# Patient Record
Sex: Female | Born: 1945 | Race: White | Hispanic: No | Marital: Married | Smoking: Never smoker
Health system: Southern US, Community
[De-identification: ages and names within clinical notes are randomized; demographics above are authoritative.]

## PROBLEM LIST (undated history)

## (undated) DIAGNOSIS — I10 Essential (primary) hypertension: Secondary | ICD-10-CM

## (undated) DIAGNOSIS — R42 Dizziness and giddiness: Secondary | ICD-10-CM

## (undated) DIAGNOSIS — J302 Other seasonal allergic rhinitis: Secondary | ICD-10-CM

## (undated) DIAGNOSIS — E669 Obesity, unspecified: Secondary | ICD-10-CM

## (undated) DIAGNOSIS — K228 Other specified diseases of esophagus: Secondary | ICD-10-CM

## (undated) DIAGNOSIS — R251 Tremor, unspecified: Secondary | ICD-10-CM

## (undated) DIAGNOSIS — K219 Gastro-esophageal reflux disease without esophagitis: Secondary | ICD-10-CM

## (undated) DIAGNOSIS — K440 Diaphragmatic hernia with obstruction, without gangrene: Secondary | ICD-10-CM

## (undated) DIAGNOSIS — K3189 Other diseases of stomach and duodenum: Secondary | ICD-10-CM

## (undated) DIAGNOSIS — M1990 Unspecified osteoarthritis, unspecified site: Secondary | ICD-10-CM

## (undated) DIAGNOSIS — E041 Nontoxic single thyroid nodule: Secondary | ICD-10-CM

## (undated) HISTORY — DX: Other seasonal allergic rhinitis: J30.2

## (undated) HISTORY — DX: Dizziness and giddiness: R42

## (undated) HISTORY — DX: Unspecified osteoarthritis, unspecified site: M19.90

## (undated) HISTORY — PX: CHOLECYSTECTOMY: SHX55

## (undated) HISTORY — DX: Diaphragmatic hernia with obstruction, without gangrene: K44.0

## (undated) HISTORY — PX: MOUTH SURGERY: SHX715

## (undated) HISTORY — DX: Nontoxic single thyroid nodule: E04.1

## (undated) HISTORY — DX: Essential (primary) hypertension: I10

## (undated) HISTORY — DX: Obesity, unspecified: E66.9

## (undated) HISTORY — PX: TUBAL LIGATION: SHX77

---

## 2005-12-27 ENCOUNTER — Encounter: Admission: RE | Admit: 2005-12-27 | Discharge: 2005-12-27 | Payer: Self-pay | Admitting: Unknown Physician Specialty

## 2006-07-12 ENCOUNTER — Encounter: Admission: RE | Admit: 2006-07-12 | Discharge: 2006-07-12 | Payer: Self-pay | Admitting: Unknown Physician Specialty

## 2006-12-07 ENCOUNTER — Encounter: Admission: RE | Admit: 2006-12-07 | Discharge: 2006-12-07 | Payer: Self-pay | Admitting: Family Medicine

## 2007-12-09 ENCOUNTER — Encounter: Admission: RE | Admit: 2007-12-09 | Discharge: 2007-12-09 | Payer: Self-pay | Admitting: Family Medicine

## 2008-12-30 ENCOUNTER — Encounter: Admission: RE | Admit: 2008-12-30 | Discharge: 2008-12-30 | Payer: Self-pay | Admitting: Family Medicine

## 2010-01-14 ENCOUNTER — Encounter: Admission: RE | Admit: 2010-01-14 | Discharge: 2010-01-14 | Payer: Self-pay | Admitting: Family Medicine

## 2010-11-30 ENCOUNTER — Ambulatory Visit: Payer: Self-pay | Admitting: Cardiology

## 2011-01-04 ENCOUNTER — Other Ambulatory Visit: Payer: Self-pay | Admitting: Family Medicine

## 2011-01-04 DIAGNOSIS — Z1231 Encounter for screening mammogram for malignant neoplasm of breast: Secondary | ICD-10-CM

## 2011-02-10 ENCOUNTER — Ambulatory Visit
Admission: RE | Admit: 2011-02-10 | Discharge: 2011-02-10 | Disposition: A | Discharge status: Home / Self Care | Payer: Medicare Other | Source: Ambulatory Visit | Attending: Family Medicine | Admitting: Family Medicine

## 2011-02-10 DIAGNOSIS — Z1231 Encounter for screening mammogram for malignant neoplasm of breast: Secondary | ICD-10-CM

## 2012-01-26 ENCOUNTER — Other Ambulatory Visit: Payer: Self-pay | Admitting: Family Medicine

## 2012-01-26 DIAGNOSIS — Z1231 Encounter for screening mammogram for malignant neoplasm of breast: Secondary | ICD-10-CM

## 2012-02-27 ENCOUNTER — Ambulatory Visit
Admission: RE | Admit: 2012-02-27 | Discharge: 2012-02-27 | Disposition: A | Discharge status: Home / Self Care | Payer: Medicare Other | Source: Ambulatory Visit | Attending: Family Medicine | Admitting: Family Medicine

## 2012-02-27 DIAGNOSIS — Z1231 Encounter for screening mammogram for malignant neoplasm of breast: Secondary | ICD-10-CM

## 2012-03-12 ENCOUNTER — Other Ambulatory Visit: Payer: Self-pay | Admitting: Family Medicine

## 2012-03-12 DIAGNOSIS — R928 Other abnormal and inconclusive findings on diagnostic imaging of breast: Secondary | ICD-10-CM

## 2012-03-19 ENCOUNTER — Ambulatory Visit
Admission: RE | Admit: 2012-03-19 | Discharge: 2012-03-19 | Disposition: A | Discharge status: Home / Self Care | Payer: Medicare Other | Source: Ambulatory Visit | Attending: Family Medicine | Admitting: Family Medicine

## 2012-03-19 DIAGNOSIS — R928 Other abnormal and inconclusive findings on diagnostic imaging of breast: Secondary | ICD-10-CM

## 2014-03-13 HISTORY — PX: CATARACT EXTRACTION, BILATERAL: SHX1313

## 2014-04-23 DIAGNOSIS — H2513 Age-related nuclear cataract, bilateral: Secondary | ICD-10-CM | POA: Diagnosis not present

## 2014-05-29 DIAGNOSIS — H524 Presbyopia: Secondary | ICD-10-CM | POA: Diagnosis not present

## 2014-05-29 DIAGNOSIS — H25012 Cortical age-related cataract, left eye: Secondary | ICD-10-CM | POA: Diagnosis not present

## 2014-05-29 DIAGNOSIS — H3531 Nonexudative age-related macular degeneration: Secondary | ICD-10-CM | POA: Diagnosis not present

## 2014-05-29 DIAGNOSIS — H2512 Age-related nuclear cataract, left eye: Secondary | ICD-10-CM | POA: Diagnosis not present

## 2014-06-09 DIAGNOSIS — H2512 Age-related nuclear cataract, left eye: Secondary | ICD-10-CM | POA: Diagnosis not present

## 2014-06-09 DIAGNOSIS — H2513 Age-related nuclear cataract, bilateral: Secondary | ICD-10-CM | POA: Diagnosis not present

## 2014-06-22 DIAGNOSIS — H25011 Cortical age-related cataract, right eye: Secondary | ICD-10-CM | POA: Diagnosis not present

## 2014-06-22 DIAGNOSIS — H2511 Age-related nuclear cataract, right eye: Secondary | ICD-10-CM | POA: Diagnosis not present

## 2014-06-30 DIAGNOSIS — H2511 Age-related nuclear cataract, right eye: Secondary | ICD-10-CM | POA: Diagnosis not present

## 2014-07-06 DIAGNOSIS — J209 Acute bronchitis, unspecified: Secondary | ICD-10-CM | POA: Diagnosis not present

## 2014-07-21 DIAGNOSIS — Z961 Presence of intraocular lens: Secondary | ICD-10-CM | POA: Diagnosis not present

## 2015-03-16 DIAGNOSIS — R609 Edema, unspecified: Secondary | ICD-10-CM | POA: Diagnosis not present

## 2015-03-16 DIAGNOSIS — J209 Acute bronchitis, unspecified: Secondary | ICD-10-CM | POA: Diagnosis not present

## 2015-03-16 DIAGNOSIS — M545 Low back pain: Secondary | ICD-10-CM | POA: Diagnosis not present

## 2015-03-18 DIAGNOSIS — Z1389 Encounter for screening for other disorder: Secondary | ICD-10-CM | POA: Diagnosis not present

## 2015-03-18 DIAGNOSIS — Z23 Encounter for immunization: Secondary | ICD-10-CM | POA: Diagnosis not present

## 2015-03-18 DIAGNOSIS — Z0000 Encounter for general adult medical examination without abnormal findings: Secondary | ICD-10-CM | POA: Diagnosis not present

## 2015-07-09 DIAGNOSIS — Z209 Contact with and (suspected) exposure to unspecified communicable disease: Secondary | ICD-10-CM | POA: Diagnosis not present

## 2015-08-19 DIAGNOSIS — Z209 Contact with and (suspected) exposure to unspecified communicable disease: Secondary | ICD-10-CM | POA: Diagnosis not present

## 2015-09-08 DIAGNOSIS — H4323 Crystalline deposits in vitreous body, bilateral: Secondary | ICD-10-CM | POA: Diagnosis not present

## 2016-03-16 DIAGNOSIS — Z209 Contact with and (suspected) exposure to unspecified communicable disease: Secondary | ICD-10-CM | POA: Diagnosis not present

## 2016-03-16 DIAGNOSIS — M545 Low back pain: Secondary | ICD-10-CM | POA: Diagnosis not present

## 2016-03-16 DIAGNOSIS — R609 Edema, unspecified: Secondary | ICD-10-CM | POA: Diagnosis not present

## 2016-03-16 DIAGNOSIS — I10 Essential (primary) hypertension: Secondary | ICD-10-CM | POA: Diagnosis not present

## 2016-03-22 DIAGNOSIS — Z0000 Encounter for general adult medical examination without abnormal findings: Secondary | ICD-10-CM | POA: Diagnosis not present

## 2016-03-24 DIAGNOSIS — Z9049 Acquired absence of other specified parts of digestive tract: Secondary | ICD-10-CM | POA: Diagnosis not present

## 2016-03-24 DIAGNOSIS — R112 Nausea with vomiting, unspecified: Secondary | ICD-10-CM | POA: Diagnosis not present

## 2016-03-24 DIAGNOSIS — R932 Abnormal findings on diagnostic imaging of liver and biliary tract: Secondary | ICD-10-CM | POA: Diagnosis not present

## 2016-03-24 DIAGNOSIS — R1011 Right upper quadrant pain: Secondary | ICD-10-CM | POA: Diagnosis not present

## 2016-09-17 DIAGNOSIS — S0990XA Unspecified injury of head, initial encounter: Secondary | ICD-10-CM | POA: Diagnosis not present

## 2016-09-17 DIAGNOSIS — E041 Nontoxic single thyroid nodule: Secondary | ICD-10-CM | POA: Diagnosis not present

## 2016-09-17 DIAGNOSIS — R0602 Shortness of breath: Secondary | ICD-10-CM | POA: Diagnosis not present

## 2016-09-17 DIAGNOSIS — S299XXA Unspecified injury of thorax, initial encounter: Secondary | ICD-10-CM | POA: Diagnosis not present

## 2016-09-17 DIAGNOSIS — S199XXA Unspecified injury of neck, initial encounter: Secondary | ICD-10-CM | POA: Diagnosis not present

## 2016-09-17 DIAGNOSIS — M542 Cervicalgia: Secondary | ICD-10-CM | POA: Diagnosis not present

## 2016-09-17 DIAGNOSIS — Z79899 Other long term (current) drug therapy: Secondary | ICD-10-CM | POA: Diagnosis not present

## 2016-09-17 DIAGNOSIS — K449 Diaphragmatic hernia without obstruction or gangrene: Secondary | ICD-10-CM | POA: Diagnosis not present

## 2016-09-17 DIAGNOSIS — R42 Dizziness and giddiness: Secondary | ICD-10-CM | POA: Diagnosis not present

## 2016-09-19 DIAGNOSIS — E041 Nontoxic single thyroid nodule: Secondary | ICD-10-CM | POA: Diagnosis not present

## 2016-09-19 DIAGNOSIS — K440 Diaphragmatic hernia with obstruction, without gangrene: Secondary | ICD-10-CM | POA: Diagnosis not present

## 2016-09-19 DIAGNOSIS — R42 Dizziness and giddiness: Secondary | ICD-10-CM | POA: Diagnosis not present

## 2016-09-20 ENCOUNTER — Encounter: Payer: Self-pay | Admitting: Gastroenterology

## 2016-09-22 DIAGNOSIS — E041 Nontoxic single thyroid nodule: Secondary | ICD-10-CM | POA: Diagnosis not present

## 2016-09-22 DIAGNOSIS — E042 Nontoxic multinodular goiter: Secondary | ICD-10-CM | POA: Diagnosis not present

## 2016-10-30 DIAGNOSIS — H8112 Benign paroxysmal vertigo, left ear: Secondary | ICD-10-CM | POA: Diagnosis not present

## 2016-10-30 DIAGNOSIS — E041 Nontoxic single thyroid nodule: Secondary | ICD-10-CM | POA: Diagnosis not present

## 2016-11-07 ENCOUNTER — Encounter: Payer: Self-pay | Admitting: Gastroenterology

## 2016-11-07 ENCOUNTER — Encounter (INDEPENDENT_AMBULATORY_CARE_PROVIDER_SITE_OTHER): Payer: Self-pay

## 2016-11-07 ENCOUNTER — Ambulatory Visit (INDEPENDENT_AMBULATORY_CARE_PROVIDER_SITE_OTHER): Payer: Medicare Other | Admitting: Gastroenterology

## 2016-11-07 VITALS — BP 130/72 | HR 84 | Ht 60.0 in | Wt 167.0 lb

## 2016-11-07 DIAGNOSIS — R0789 Other chest pain: Secondary | ICD-10-CM

## 2016-11-07 DIAGNOSIS — K449 Diaphragmatic hernia without obstruction or gangrene: Secondary | ICD-10-CM | POA: Diagnosis not present

## 2016-11-07 DIAGNOSIS — R1314 Dysphagia, pharyngoesophageal phase: Secondary | ICD-10-CM | POA: Diagnosis not present

## 2016-11-07 DIAGNOSIS — K219 Gastro-esophageal reflux disease without esophagitis: Secondary | ICD-10-CM | POA: Diagnosis not present

## 2016-11-07 NOTE — Patient Instructions (Signed)
If you are age 72 or older, your body mass index should be between 23-30. Your Body mass index is 32.61 kg/m. If this is out of the aforementioned range listed, please consider follow up with your Primary Care Provider.  If you are age 64 or younger, your body mass index should be between 19-25. Your Body mass index is 32.61 kg/m. If this is out of the aformentioned range listed, please consider follow up with your Primary Care Provider.   You have been scheduled for an endoscopy. Please follow written instructions given to you at your visit today. If you use inhalers (even only as needed), please bring them with you on the day of your procedure. Your physician has requested that you go to www.startemmi.com and enter the access code given to you at your visit today. This web site gives a general overview about your procedure. However, you should still follow specific instructions given to you by our office regarding your preparation for the procedure.  Thank you for choosing [facility]  Dr Henry Danis III  
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
## 2016-11-16 ENCOUNTER — Telehealth: Payer: Self-pay | Admitting: Gastroenterology

## 2016-11-16 ENCOUNTER — Ambulatory Visit (AMBULATORY_SURGERY_CENTER): Payer: Medicare Other | Admitting: Gastroenterology

## 2016-11-16 ENCOUNTER — Encounter: Payer: Self-pay | Admitting: Gastroenterology

## 2016-11-16 VITALS — BP 136/80 | HR 80 | Temp 97.3°F | Resp 20 | Ht 60.0 in | Wt 167.0 lb

## 2016-11-16 DIAGNOSIS — R0789 Other chest pain: Secondary | ICD-10-CM

## 2016-11-16 DIAGNOSIS — K219 Gastro-esophageal reflux disease without esophagitis: Secondary | ICD-10-CM

## 2016-11-16 DIAGNOSIS — R1310 Dysphagia, unspecified: Secondary | ICD-10-CM

## 2016-11-16 DIAGNOSIS — R1319 Other dysphagia: Secondary | ICD-10-CM

## 2016-11-16 DIAGNOSIS — K449 Diaphragmatic hernia without obstruction or gangrene: Secondary | ICD-10-CM

## 2016-11-16 MED ORDER — SODIUM CHLORIDE 0.9 % IV SOLN: 500.0000 mL | INTRAVENOUS | Status: DC

## 2016-11-16 NOTE — Telephone Encounter (Signed)
Please schedule the following:  1)  Upper GI Xray series.  Dx: dysphagia, large hiatal hernia  2)  Referral to [facility], Dr Abbey Chatters, Ezz[facility] Standing or [place].  Dx: large hiatal hernia/intrathoracic stomach.  Please let me know which doc it will be so I can send them a message about her. Send my recent office consult note, EGD report from today, and Upper GI Series report when it is available.

## 2016-11-16 NOTE — Progress Notes (Signed)
Report given to PACU, vss 

## 2016-11-16 NOTE — Op Note (Signed)
[facility] Patient Name: Sydney [name] Procedure Date: 11/16/2016 8:18 AM MRN: [id number] Endoscopist: Sherilyn Cooter L. Myrtie Neither , MD Age: 71 Referring MD:  Date of Birth: 1945/10/10 Gender: Female Account #: [id number] Procedure:                Upper GI endoscopy Indications:              Dysphagia, Abnormal chest Xray suggesting hiatal                            hernia, Unexplained right-sided post-prandial chest                            pain Medicines:                Monitored Anesthesia Care Procedure:                Pre-Anesthesia Assessment:                           - Prior to the procedure, a History and Physical                            was performed, and patient medications and                            allergies were reviewed. The patient's tolerance of                            previous anesthesia was also reviewed. The risks                            and benefits of the procedure and the sedation                            options and risks were discussed with the patient.                            All questions were answered, and informed consent                            was obtained. Prior Anticoagulants: The patient has                            taken no previous anticoagulant or antiplatelet                            agents. ASA Grade Assessment: II - A patient with                            mild systemic disease. After reviewing the risks                            and benefits, the patient was deemed in  satisfactory condition to undergo the procedure.                           After obtaining informed consent, the endoscope was                            passed under direct vision. Throughout the                            procedure, the patient's blood pressure, pulse, and                            oxygen saturations were monitored continuously. The                            Model GIF-HQ190 [id number]) scope was introduced                            through the mouth, and advanced to the second part                            of duodenum. The upper GI endoscopy was performed                            with moderate difficulty due to abnormal anatomy.                            The patient tolerated the procedure well. Scope In: Scope Out: Findings:                 The larynx was normal.                           A very large hiatal hernia was present. (2/3 - 3/4                            of the stomach is intrathoracic)                           The exam of the stomach was otherwise normal.                           The examined duodenum was normal. Complications:            No immediate complications. Estimated Blood Loss:     Estimated blood loss: none. Impression:               - Normal larynx.                           - Very large hiatal hernia.                           - Normal examined duodenum.                           -  No specimens collected. Recommendation:           - Patient has a contact number available for                            emergencies. The signs and symptoms of potential                            delayed complications were discussed with the                            patient. Return to normal activities tomorrow.                            Written discharge instructions were provided to the                            patient.                           - Resume previous diet.                           - Continue present medications.                           - Do an upper GI series.                           - Refer to a Careers adviser. Hurshell Dino L. Myrtie Neither, MD 11/16/2016 8:49:59 AM This report has been signed electronically.

## 2016-11-16 NOTE — Patient Instructions (Signed)
YOU HAD AN ENDOSCOPIC PROCEDURE TODAY AT THE [facility]:   Refer to the procedure report that was given to you for any specific questions about what was found during the examination.  If the procedure report does not answer your questions, please call your gastroenterologist to clarify.  If you requested that your care partner not be given the details of your procedure findings, then the procedure report has been included in a sealed envelope for you to review at your convenience later.  YOU SHOULD EXPECT: Some feelings of bloating in the abdomen. Passage of more gas than usual.  Walking can help get rid of the air that was put into your GI tract during the procedure and reduce the bloating.   Please Note:  You might notice some irritation and congestion in your nose or some drainage.  This is from the oxygen used during your procedure.  There is no need for concern and it should clear up in a day or so.  SYMPTOMS TO REPORT IMMEDIATELY:    Following upper endoscopy (EGD)  Vomiting of blood or coffee ground material  New chest pain or pain under the shoulder blades  Painful or persistently difficult swallowing  New shortness of breath  Fever of 100F or higher  Black, tarry-looking stools  For urgent or emergent issues, a gastroenterologist can be reached at any hour by calling [phone].   DIET:  We do recommend a small meal at first, but then you may proceed to your regular diet.  Drink plenty of fluids but you should avoid alcoholic beverages for 24 hours.  ACTIVITY:  You should plan to take it easy for the rest of today and you should NOT DRIVE or use heavy machinery until tomorrow (because of the sedation medicines used during the test).    FOLLOW UP: Our staff will call the number listed on your records the next business day following your procedure to check on you and address any questions or concerns that you may have regarding the information given to you  following your procedure. If we do not reach you, we will leave a message.  However, if you are feeling well and you are not experiencing any problems, there is no need to return our call.  We will assume that you have returned to your regular daily activities without incident.  If any biopsies were taken you will be contacted by phone or by letter within the next 1-3 weeks.  Please call us at [phone] if you have not heard about the biopsies in 3 weeks.    SIGNATURES/CONFIDENTIALITY: You and/or your care partner have signed paperwork which will be entered into your electronic medical record.  These signatures attest to the fact that that the information above on your After Visit Summary has been reviewed and is understood.  Full responsibility of the confidentiality of this discharge information lies with you and/or your care-partner.  Read all of the handouts given to you by your recovery room nurse.  The office staff will arrange your upper GI series, and they will call you with all of the information.  You may need surgery to repair the hernia.   Call us if you need [place].

## 2016-11-17 ENCOUNTER — Telehealth: Payer: Self-pay | Admitting: *Deleted

## 2016-11-17 ENCOUNTER — Other Ambulatory Visit: Payer: Self-pay

## 2016-11-17 DIAGNOSIS — R1310 Dysphagia, unspecified: Secondary | ICD-10-CM

## 2016-11-17 DIAGNOSIS — K449 Diaphragmatic hernia without obstruction or gangrene: Secondary | ICD-10-CM

## 2016-11-17 NOTE — Telephone Encounter (Signed)
  Follow up Call-  Call back number 11/16/2016  Post procedure Call Back phone  # [phone]  Permission to leave phone message Yes  Some recent data might be hidden     Patient questions:  Do you have a fever, pain , or abdominal swelling? No. Pain Score  0 *  Have you tolerated food without any problems? Yes.    Have you been able to return to your normal activities? Yes.    Do you have any questions about your discharge instructions: Diet   No. Medications  No. Follow up visit  No.  Do you have questions or concerns about your Care? No.  Actions: * If pain score is 4 or above: No action needed, pain <4.

## 2016-11-17 NOTE — Telephone Encounter (Signed)
Patient is scheduled for UGI at [facility], as this is closest to her home, on 11/22/16 arrive 9:45 for 10:00. Patient aware to be NPO after midnight. Faxed referral to [facility].

## 2016-11-22 ENCOUNTER — Ambulatory Visit (HOSPITAL_COMMUNITY)
Admission: RE | Admit: 2016-11-22 | Discharge: 2016-11-22 | Disposition: A | Discharge status: Home / Self Care | Payer: Medicare Other | Source: Ambulatory Visit | Attending: Gastroenterology | Admitting: Gastroenterology

## 2016-11-22 DIAGNOSIS — R1310 Dysphagia, unspecified: Secondary | ICD-10-CM

## 2016-11-22 DIAGNOSIS — K219 Gastro-esophageal reflux disease without esophagitis: Secondary | ICD-10-CM | POA: Diagnosis not present

## 2016-11-22 DIAGNOSIS — K449 Diaphragmatic hernia without obstruction or gangrene: Secondary | ICD-10-CM | POA: Insufficient documentation

## 2016-11-22 DIAGNOSIS — R109 Unspecified abdominal pain: Secondary | ICD-10-CM | POA: Diagnosis not present

## 2016-11-23 ENCOUNTER — Telehealth: Payer: Self-pay

## 2016-11-23 NOTE — Telephone Encounter (Signed)
Spoke to patient to see if she is aware of her appointment with [facility] on 12/07/16 at 1:30. She will see Dr. Abbey Chatters.

## 2016-11-28 ENCOUNTER — Encounter: Payer: Medicare Other | Admitting: Gastroenterology

## 2016-12-06 ENCOUNTER — Other Ambulatory Visit (HOSPITAL_COMMUNITY): Payer: Self-pay | Admitting: General Surgery

## 2016-12-06 DIAGNOSIS — K219 Gastro-esophageal reflux disease without esophagitis: Secondary | ICD-10-CM | POA: Diagnosis not present

## 2016-12-06 DIAGNOSIS — K449 Diaphragmatic hernia without obstruction or gangrene: Principal | ICD-10-CM

## 2016-12-08 ENCOUNTER — Encounter (HOSPITAL_COMMUNITY): Payer: Self-pay

## 2016-12-08 ENCOUNTER — Encounter (HOSPITAL_COMMUNITY)
Admission: RE | Admit: 2016-12-08 | Discharge: 2016-12-08 | Disposition: A | Discharge status: Home / Self Care | Payer: Medicare Other | Source: Ambulatory Visit | Attending: General Surgery | Admitting: General Surgery

## 2016-12-08 DIAGNOSIS — K449 Diaphragmatic hernia without obstruction or gangrene: Secondary | ICD-10-CM | POA: Diagnosis not present

## 2016-12-08 DIAGNOSIS — K219 Gastro-esophageal reflux disease without esophagitis: Secondary | ICD-10-CM | POA: Diagnosis not present

## 2016-12-08 MED ORDER — TECHNETIUM TC 99M SULFUR COLLOID
2.0000 | Freq: Once | INTRAVENOUS | Status: AC | PRN
  Administered 2016-12-08: 2 via ORAL

## 2016-12-25 ENCOUNTER — Ambulatory Visit: Payer: Self-pay | Admitting: Surgery

## 2016-12-25 DIAGNOSIS — K219 Gastro-esophageal reflux disease without esophagitis: Secondary | ICD-10-CM | POA: Diagnosis not present

## 2016-12-25 DIAGNOSIS — K449 Diaphragmatic hernia without obstruction or gangrene: Secondary | ICD-10-CM | POA: Diagnosis not present

## 2016-12-25 DIAGNOSIS — K3189 Other diseases of stomach and duodenum: Secondary | ICD-10-CM | POA: Diagnosis not present

## 2016-12-25 NOTE — H&P (Signed)
Sydney [name] 12/25/2016 1:51 PM Location: [facility] Patient #: [id number] DOB: 03-16-1945 Married / Language: English / Race: White Female  History of Present Illness [name] MD; 12/25/2016 2:52 PM) The patient is a 71 year old female who presents with a hiatal hernia. Note for "Hiatal hernia": ` ` ` Patient sent for surgical consultation at the request of Dr. Abbey Chatters  Chief Complaint: Hiatal hernia  The patient is a pleasant female that has struggled with worsening reflux especially this past year. She had been intermittently mild not needing to much aware antiacid meds. However she is noted more early satiety. Some occasional food sticking. Unintentionally lost about a dozen pounds in the past year. She fell at home. Concern for injury. [facility]. They revealed a giant hiatal hernia. Patient has seen gastroenterology. Endoscopy confirmed a large hiatal hernia. Upper GI series shows a stomach flipped upside down and organic axial rotation with even the duodenal bulb up in the mediastinum. Patient's had episodes of choking episodes. Especially with meats. Has had fluid and mucus come back up her throat. Not so much acid. Been on ranitidine and occasionally proton pump inhibitors. She's had episodes of intermittent start red sided chest pain. Not related to activity. No pressure or numbness to her neck or jaw. No cardiac issues. No lung issues such as asthma or pneumonias. She's had worsening constipation. Usually need stool softeners to move her bowels about twice a week. She's had to call screening colonoscopies have been underwhelming.  Based on the large hiatal hernia with symptoms, surgical consultation requested. See my my partner, Dr. Lolly Mustache. He is retiring in a few months, so he referred the patient to myself to be primary for long-term continuity  (Review of systems as stated in this history (HPI) or in the review of  systems. Otherwise all other 12 point ROS are negative)   Allergies Christianne Dolin, RMA; 12/25/2016 1:51 PM) No Known Drug Allergies 12/06/2016  Medication History Christianne Dolin, RMA; 12/25/2016 1:51 PM) Chlorthalidone (  Tablet, Oral) Active. RaNITidine HCl (  Tablet, Oral) Active. Medications Reconciled    Vitals Christianne Dolin RMA; 12/25/2016 1:52 PM) 12/25/2016 1:51 PM Weight: 162 lb Height: 60in Body Surface Area: 1.71 m Body Mass Index: 31.64 kg/m  Temp.: 97.66F  Pulse: 92 (Regular)  BP: 140/84 (Sitting, Left Arm, Standard)      Physical Exam [name] MD; 12/25/2016 2:46 PM)  General Mental Status-Alert. General Appearance-Not in acute distress, Not Sickly. Orientation-Oriented X3. Hydration-Well hydrated. Voice-Normal.  Integumentary Global Assessment Upon inspection and palpation of skin surfaces of the - Axillae: non-tender, no inflammation or ulceration, no drainage. and Distribution of scalp and body hair is normal. General Characteristics Temperature - normal warmth is noted.  Head and Neck Head-normocephalic, atraumatic with no lesions or palpable masses. Face Global Assessment - atraumatic, no absence of expression. Neck Global Assessment - no abnormal movements, no bruit auscultated on the right, no bruit auscultated on the left, no decreased range of motion, non-tender. Trachea-midline. Thyroid Gland Characteristics - non-tender.  Eye Eyeball - Left-Extraocular movements intact, No Nystagmus. Eyeball - Right-Extraocular movements intact, No Nystagmus. Cornea - Left-No Hazy. Cornea - Right-No Hazy. Sclera/Conjunctiva - Left-No scleral icterus, No Discharge. Sclera/Conjunctiva - Right-No scleral icterus, No Discharge. Pupil - Left-Direct reaction to light normal. Pupil - Right-Direct reaction to light normal. Note: Wears glasses. Vision corrected. Slightly higher right  eyelid  ENMT Ears Pinna - Left - no drainage observed, no generalized tenderness observed. Right -  no drainage observed, no generalized tenderness observed. Nose and Sinuses External Inspection of the Nose - no destructive lesion observed. Inspection of the nares - Left - quiet respiration. Right - quiet respiration. Mouth and Throat Lips - Upper Lip - no fissures observed, no pallor noted. Lower Lip - no fissures observed, no pallor noted. Nasopharynx - no discharge present. Oral Cavity/Oropharynx - Tongue - no dryness observed. Oral Mucosa - no cyanosis observed. Hypopharynx - no evidence of airway distress observed.  Chest and Lung Exam Inspection Movements - Normal and Symmetrical. Accessory muscles - No use of accessory muscles in breathing. Palpation Palpation of the chest reveals - Non-tender. Auscultation Breath sounds - Normal and Clear.  Cardiovascular Auscultation Rhythm - Regular. Murmurs & Other Heart Sounds - Auscultation of the heart reveals - No Murmurs and No Systolic Clicks.  Abdomen Inspection Inspection of the abdomen reveals - No Visible peristalsis and No Abnormal pulsations. Umbilicus - No Bleeding, No Urine drainage. Palpation/Percussion Palpation and Percussion of the abdomen reveal - Soft, Non Tender, No Rebound tenderness, No Rigidity (guarding) and No Cutaneous hyperesthesia. Note: Overweight & short waisted. Abdomen soft. Not distended. Mild distasis recti. No umbilical or other anterior abdominal wall hernias  Female Genitourinary Sexual Maturity Tanner 5 - Adult hair pattern. Note: No vaginal bleeding nor discharge  Peripheral Vascular Upper Extremity Inspection - Left - No Cyanotic nailbeds, Not Ischemic. Right - No Cyanotic nailbeds, Not Ischemic.  Neurologic Neurologic evaluation reveals -normal attention span and ability to concentrate, able to name objects and repeat phrases. Appropriate fund of knowledge , normal sensation and normal  coordination. Mental Status Affect - not angry, not paranoid. Cranial Nerves-Normal Bilaterally. Gait-Normal.  Neuropsychiatric Mental status exam performed with findings of-able to articulate well with normal speech/language, rate, volume and coherence, thought content normal with ability to perform basic computations and apply abstract reasoning and no evidence of hallucinations, delusions, obsessions or homicidal/suicidal ideation.  Musculoskeletal Global Assessment Spine, Ribs and Pelvis - no instability, subluxation or laxity. Right Upper Extremity - no instability, subluxation or laxity.  Lymphatic Head & Neck  General Head & Neck Lymphatics: Bilateral - Description - No Localized lymphadenopathy. Axillary  General Axillary Region: Bilateral - Description - No Localized lymphadenopathy. Femoral & Inguinal  Generalized Femoral & Inguinal Lymphatics: Left - Description - No Localized lymphadenopathy. Right - Description - No Localized lymphadenopathy.    Assessment & Plan [name] MD; 12/25/2016 2:41 PM)  HIATAL HERNIA WITH GERD (K21.9) Impression: She has a large hiatal hernia with intrathoracic stomach. She is symptomatic from it with dysphagia postprandial right-sided noncardiac chest pain, and reflux.  Her esophagus does not seem to be shortened, so I would like to get manometry to make sure that her chest pain and dysphagia has not thrown up primary esophageal disorder. Also be helpful to see if she can tolerate a complete fundoplication.  I think she would benefit from hiatal hernia repair. Minimally invasive approach. Robotic versus laparoscopic. Most likely a pledgeted repair and onlay biologic mesh. Lasix. Might need to do relaxing incision if extremely tight. Fundoplication for heartburn control. Nissen if manometry is good. No need for pyloromyotomy as gastric emptying study is normal  Discussed the need for a pured diet the first few weeks especially  in a gradual advancement. Esophageal surgery dietary sheet given. The patient has been no more than ready to proceed. Its becoming more disruptive and concerning for her life.  Current Plans You are being scheduled for surgery- Our schedulers  will call you.  You should hear from our office's scheduling department within 5 working days about the location, date, and time of surgery. We try to make accommodations for patient's preferences in scheduling surgery, but sometimes the OR schedule or the surgeon's schedule prevents [place] from making those accommodations.  If you have not heard from our office [phone]) in 5 working days, call the office and ask for your surgeon's nurse.  If you have other questions about your diagnosis, plan, or surgery, call the office and ask for your surgeon's nurse.  Follow Up - Call [facility] office after tests / studies doneto discuss further plans Pt Education - [facility] Esophageal Surgery Diet HCI (Lexee Brashears): discussed with patient and provided information. Pt Education - [facility] Laparoscopic Surgery HCI The anatomy & physiology of the foregut and anti-reflux mechanism was discussed. The pathophysiology of hiatal herniation and GERD was discussed. Natural history risks without surgery was discussed. The patient's symptoms are not adequately controlled by medicines and other non-operative treatments. I feel the risks of no intervention will lead to serious problems that outweigh the operative risks; therefore, I recommended surgery to reduce the hiatal hernia out of the chest and fundoplication to rebuild the anti-reflux valve and control reflux better. Need for a thorough workup to rule out the differential diagnosis and plan treatment was explained. I explained laparoscopic techniques with possible need for an open approach.  Risks such as bleeding, infection, abscess, leak, need for further treatment, heart attack, death, and other risks were discussed. I noted a good  likelihood this will help address the problem. Goals of post-operative recovery were discussed as well. Possibility that this will not correct all symptoms was explained. Post-operative dysphagia, need for short-term liquid & pureed diet, inability to vomit, possibility of reherniation, possible need for medicines to help control symptoms in addition to surgery were discussed. We will work to minimize complications. Educational handouts further explaining the pathology, treatment options, and dysphagia diet was given as well. Questions were answered. The patient expresses understanding & wishes to proceed with surgery.  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

## 2016-12-26 ENCOUNTER — Other Ambulatory Visit: Payer: Self-pay

## 2016-12-26 ENCOUNTER — Telehealth: Payer: Self-pay | Admitting: Gastroenterology

## 2016-12-26 DIAGNOSIS — K449 Diaphragmatic hernia without obstruction or gangrene: Secondary | ICD-10-CM

## 2016-12-26 NOTE — Telephone Encounter (Signed)
I have scheduled the patient for 01/03/17. She is aware of the appointment. Thank you

## 2017-01-01 NOTE — Progress Notes (Signed)
Called and left a message to remind pt of esophageal manometry appointment on 01/03/2017 at [facility] hospital at 0830. Informed pt to be here around 0815 and not to have anything to eat or drink 6 hours before. Please call [phone] if any questions .

## 2017-01-03 ENCOUNTER — Ambulatory Visit (HOSPITAL_COMMUNITY)
Admission: RE | Admit: 2017-01-03 | Discharge: 2017-01-03 | Disposition: A | Discharge status: Home / Self Care | Payer: Medicare Other | Source: Ambulatory Visit | Attending: Gastroenterology | Admitting: Gastroenterology

## 2017-01-03 ENCOUNTER — Encounter (HOSPITAL_COMMUNITY)
Admission: RE | Disposition: A | Discharge status: Home / Self Care | Payer: Self-pay | Source: Ambulatory Visit | Attending: Gastroenterology

## 2017-01-03 DIAGNOSIS — K449 Diaphragmatic hernia without obstruction or gangrene: Secondary | ICD-10-CM

## 2017-01-03 DIAGNOSIS — K219 Gastro-esophageal reflux disease without esophagitis: Secondary | ICD-10-CM

## 2017-01-03 HISTORY — PX: ESOPHAGEAL MANOMETRY: SHX5429

## 2017-01-03 SURGERY — MANOMETRY, ESOPHAGUS

## 2017-01-03 MED ORDER — LIDOCAINE VISCOUS 2 % MT SOLN
OROMUCOSAL | Status: AC
  Filled 2017-01-03: qty 15

## 2017-01-03 SURGICAL SUPPLY — 2 items
FACESHIELD LNG OPTICON STERILE (SAFETY) IMPLANT
GLOVE BIO SURGEON STRL SZ8 (GLOVE) ×6 IMPLANT

## 2017-01-03 NOTE — Progress Notes (Signed)
Esophageal Manometry done per protocol.  Patient tolerated well, w/o complication.  Dr. Lavon Paganini to be notified today of study.  Omelia Blackwater, RN

## 2017-01-04 ENCOUNTER — Encounter (HOSPITAL_COMMUNITY): Payer: Self-pay | Admitting: Gastroenterology

## 2017-01-11 DIAGNOSIS — K449 Diaphragmatic hernia without obstruction or gangrene: Secondary | ICD-10-CM

## 2017-01-11 DIAGNOSIS — K219 Gastro-esophageal reflux disease without esophagitis: Secondary | ICD-10-CM

## 2017-01-22 ENCOUNTER — Telehealth: Payer: Self-pay

## 2017-01-22 NOTE — Telephone Encounter (Signed)
Advised of results from esophageal manometry are viewable in EPIC

## 2017-01-25 NOTE — Progress Notes (Signed)
EKG 09-27-16 on chart from [facility] care   CXR 09-27-16 on chart from [facility] care

## 2017-01-25 NOTE — Patient Instructions (Signed)
Sydney [name]  01/25/2017   Your procedure is scheduled on: 01-31-17  Report to [facility] Main  Entrance Take [place]  elevators to 3rd floor to  Short Stay Center at [phone].   Call this number if you have problems the morning of surgery [phone]    Remember: ONLY 1 PERSON MAY GO WITH YOU TO SHORT STAY TO GET  READY MORNING OF YOUR SURGERY.    Do not eat food or drink liquids :After Midnight unless specified by your surgeon.      Take these medicines the morning of surgery with A SIP OF WATER: tylenol as needed, ranitidine                                 You may not have any metal on your body including hair pins and              piercings  Do not wear jewelry, make-up, lotions, powders or perfumes, deodorant             Do not wear nail polish.  Do not shave  48 hours prior to surgery.               Do not bring valuables to the hospital. [facility] IS NOT             RESPONSIBLE   FOR VALUABLES.  Contacts, dentures or bridgework may not be worn into surgery.  Leave suitcase in the car. After surgery it may be brought to your room.                 Please read over the following fact sheets you were given: _____________________________________________________________________             [facility] - Preparing for Surgery Before surgery, you can play an important role.  Because skin is not sterile, your skin needs to be as free of germs as possible.  You can reduce the number of germs on your skin by washing with CHG (chlorahexidine gluconate) soap before surgery.  CHG is an antiseptic cleaner which kills germs and bonds with the skin to continue killing germs even after washing. Please DO NOT use if you have an allergy to CHG or antibacterial soaps.  If your skin becomes reddened/irritated stop using the CHG and inform your nurse when you arrive at Short Stay. Do not shave (including legs and underarms) for at least 48 hours prior to the first CHG  shower.  You may shave your face/neck. Please follow these instructions carefully:  1.  Shower with CHG Soap the night before surgery and the  morning of Surgery.  2.  If you choose to wash your hair, wash your hair first as usual with your  normal  shampoo.  3.  After you shampoo, rinse your hair and body thoroughly to remove the  shampoo.                           4.  Use CHG as you would any other liquid soap.  You can apply chg directly  to the skin and wash                       Gently with a scrungie or clean washcloth.  5.  Apply the CHG Soap to your body ONLY FROM THE NECK DOWN.   Do not use on face/ open                           Wound or open sores. Avoid contact with eyes, ears mouth and genitals (private parts).                       Wash face,  Genitals (private parts) with your normal soap.             6.  Wash thoroughly, paying special attention to the area where your surgery  will be performed.  7.  Thoroughly rinse your body with warm water from the neck down.  8.  DO NOT shower/wash with your normal soap after using and rinsing off  the CHG Soap.                9.  Pat yourself dry with a clean towel.            10.  Wear clean pajamas.            11.  Place clean sheets on your bed the night of your first shower and do not  sleep with pets. Day of Surgery : Do not apply any lotions/deodorants the morning of surgery.  Please wear clean clothes to the hospital/surgery center.  FAILURE TO FOLLOW THESE INSTRUCTIONS MAY RESULT IN THE CANCELLATION OF YOUR SURGERY PATIENT SIGNATURE_________________________________  NURSE SIGNATURE__________________________________  ________________________________________________________________________

## 2017-01-26 ENCOUNTER — Other Ambulatory Visit: Payer: Self-pay

## 2017-01-26 ENCOUNTER — Encounter (HOSPITAL_COMMUNITY)
Admission: RE | Admit: 2017-01-26 | Discharge: 2017-01-26 | Disposition: A | Discharge status: Home / Self Care | Payer: Medicare Other | Source: Ambulatory Visit | Attending: Surgery | Admitting: Surgery

## 2017-01-26 ENCOUNTER — Encounter (HOSPITAL_COMMUNITY): Payer: Self-pay

## 2017-01-26 DIAGNOSIS — K562 Volvulus: Secondary | ICD-10-CM | POA: Diagnosis not present

## 2017-01-26 DIAGNOSIS — K449 Diaphragmatic hernia without obstruction or gangrene: Secondary | ICD-10-CM | POA: Insufficient documentation

## 2017-01-26 DIAGNOSIS — Z01818 Encounter for other preprocedural examination: Secondary | ICD-10-CM | POA: Diagnosis not present

## 2017-01-26 LAB — BASIC METABOLIC PANEL
ANION GAP: 10 (ref 5–15)
BUN: 15 mg/dL (ref 6–20)
CALCIUM: 9.7 mg/dL (ref 8.9–10.3)
CHLORIDE: 98 mmol/L — AB (ref 101–111)
CO2: 31 mmol/L (ref 22–32)
CREATININE: 0.7 mg/dL (ref 0.44–1.00)
GFR calc non Af Amer: >60 mL/min (ref >60)
GLUCOSE: 100 mg/dL — AB (ref 65–99)
Potassium: 2.9 mmol/L — ABNORMAL LOW (ref 3.5–5.1)
Sodium: 139 mmol/L (ref 135–145)

## 2017-01-26 LAB — CBC
HCT: 42.7 % (ref 36.0–46.0)
HEMOGLOBIN: 15.1 g/dL — AB (ref 12.0–15.0)
MCH: 28.8 pg (ref 26.0–34.0)
MCHC: 35.4 g/dL (ref 30.0–36.0)
MCV: 81.5 fL (ref 78.0–100.0)
Platelets: 269 10*3/uL (ref 150–400)
RBC: 5.24 MIL/uL — AB (ref 3.87–5.11)
RDW: 12.9 % (ref 11.5–15.5)
WBC: 5.4 10*3/uL (ref 4.0–10.5)

## 2017-01-26 NOTE — Progress Notes (Signed)
BMP routed via epic to Dr Michaell Cowing

## 2017-01-30 ENCOUNTER — Ambulatory Visit: Payer: Self-pay | Admitting: Surgery

## 2017-01-30 NOTE — Anesthesia Preprocedure Evaluation (Addendum)
Anesthesia Evaluation  Patient identified by MRN, date of birth, ID band Patient awake    Reviewed: Allergy & Precautions, NPO status , Patient's Chart, lab work & pertinent test results  Airway Mallampati: II  TM Distance: >3 FB Neck ROM: Full    Dental no notable dental hx.    Pulmonary neg pulmonary ROS,    Pulmonary exam normal breath sounds clear to auscultation       Cardiovascular hypertension, Normal cardiovascular exam Rhythm:Regular Rate:Normal  EKG 09-27-16 on chart from [facility] care. SR, rate 67   Neuro/Psych negative neurological ROS  negative psych ROS   GI/Hepatic Neg liver ROS, hiatal hernia, GERD  Medicated,PARAESOPHAGEAL HIATAL HERNIA WITH ORGANOAXIAL VOLVULUS   Endo/Other  negative endocrine ROS  Renal/GU negative Renal ROS     Musculoskeletal negative musculoskeletal ROS (+)   Abdominal (+) + obese,   Peds  Hematology negative hematology ROS (+)   Anesthesia Other Findings   Reproductive/Obstetrics                            Anesthesia Physical Anesthesia Plan  ASA: II  Anesthesia Plan: General   Post-op Pain Management:    Induction: Intravenous  PONV Risk Score and Plan: 3 and Ondansetron, Dexamethasone and Treatment may vary due to age or medical condition  Airway Management Planned: Oral ETT  Additional Equipment:   Intra-op Plan:   Post-operative Plan: Extubation in OR  Informed Consent: I have reviewed the patients History and Physical, chart, labs and discussed the procedure including the risks, benefits and alternatives for the proposed anesthesia with the patient or authorized representative who has indicated his/her understanding and acceptance.   Dental advisory given  Plan Discussed with: CRNA  Anesthesia Plan Comments:        Anesthesia Quick Evaluation

## 2017-01-30 NOTE — H&P (Addendum)
Sydney [name]  Location: [facility] Surgery Patient #: [id number] DOB: 11/23/1945 Married / Language: English / Race: White Female  Patient Care Team: Selinda Flavin, MD as PCP - General (Family Medicine) Karie Soda, MD as Consulting Physician (General Surgery) Danis, Andreas Blower, MD as Consulting Physician (Gastroenterology)   History of Present Illness Sydney Sportsman MD; 12/25/2016 2:52 PM) The patient is a 71 year old female who presents with a hiatal hernia. Note for "Hiatal hernia": ` ` ` Patient sent for surgical consultation at the request of Dr. Abbey Chatters  Chief Complaint: Hiatal hernia  The patient is a pleasant female that has struggled with worsening reflux especially this past year. She had been intermittently mild not needing to much aware antiacid meds. However she is noted more early satiety. Some occasional food sticking. Unintentionally lost about a dozen pounds in the past year. She fell at home. Concern for injury. [facility]. They revealed a giant hiatal hernia. Patient has seen gastroenterology. Endoscopy confirmed a large hiatal hernia. Upper GI series shows a stomach flipped upside down and organic axial rotation with even the duodenal bulb up in the mediastinum. Patient's had episodes of choking episodes. Especially with meats. Has had fluid and mucus come back up her throat. Not so much acid. Been on ranitidine and occasionally proton pump inhibitors. She's had episodes of intermittent start red sided chest pain. Not related to activity. No pressure or numbness to her neck or jaw. No cardiac issues. No lung issues such as asthma or pneumonias. She's had worsening constipation. Usually need stool softeners to move her bowels about twice a week. She's had to call screening colonoscopies have been underwhelming.  Based on the large hiatal hernia with symptoms, surgical consultation requested. See my my partner, Dr. Abbey Chatters.  He is retiring in a few months, so he referred the patient to myself to be primary for long-term continuity  (Review of systems as stated in this history (HPI) or in the review of systems. Otherwise all other 12 point ROS are negative)   Allergies Christianne Dolin, RMA; 12/25/2016 1:51 PM) No Known Drug Allergies 12/06/2016  Medication History Christianne Dolin, RMA; 12/25/2016 1:51 PM) Chlorthalidone (25MG  Tablet, Oral) Active. RaNITidine HCl (300MG  Tablet, Oral) Active. Medications Reconciled  Vitals RMA; 12/25/2016 1:52 PM) 12/25/2016 1:51 PM Weight: 162 lb Height: 60in Body Surface Area: 1.71 m Body Mass Index: 31.64 kg/m  Temp.: 97.76F  Pulse: 92 (Regular)  BP: 140/84 (Sitting, Left Arm, Standard)       Physical Exam 12/27/2016 MD; 12/25/2016 2:46 PM) General Mental Status-Alert. General Appearance-Not in acute distress, Not Sickly. Orientation-Oriented X3. Hydration-Well hydrated. Voice-Normal.  Integumentary Global Assessment Upon inspection and palpation of skin surfaces of the - Axillae: non-tender, no inflammation or ulceration, no drainage. and Distribution of scalp and body hair is normal. General Characteristics Temperature - normal warmth is noted.  Head and Neck Head-normocephalic, atraumatic with no lesions or palpable masses. Face Global Assessment - atraumatic, no absence of expression. Neck Global Assessment - no abnormal movements, no bruit auscultated on the right, no bruit auscultated on the left, no decreased range of motion, non-tender. Trachea-midline. Thyroid Gland Characteristics - non-tender.  Eye Eyeball - Left-Extraocular movements intact, No Nystagmus. Eyeball - Right-Extraocular movements intact, No Nystagmus. Cornea - Left-No Hazy. Cornea - Right-No Hazy. Sclera/Conjunctiva - Left-No scleral icterus, No Discharge. Sclera/Conjunctiva - Right-No scleral icterus,  No Discharge. Pupil - Left-Direct reaction to light normal. Pupil - Right-Direct reaction to  light normal. Note: Wears glasses. Vision corrected. Slightly higher right eyelid   ENMT Ears Pinna - Left - no drainage observed, no generalized tenderness observed. Right - no drainage observed, no generalized tenderness observed. Nose and Sinuses External Inspection of the Nose - no destructive lesion observed. Inspection of the nares - Left - quiet respiration. Right - quiet respiration. Mouth and Throat Lips - Upper Lip - no fissures observed, no pallor noted. Lower Lip - no fissures observed, no pallor noted. Nasopharynx - no discharge present. Oral Cavity/Oropharynx - Tongue - no dryness observed. Oral Mucosa - no cyanosis observed. Hypopharynx - no evidence of airway distress observed.  Chest and Lung Exam Inspection Movements - Normal and Symmetrical. Accessory muscles - No use of accessory muscles in breathing. Palpation Palpation of the chest reveals - Non-tender. Auscultation Breath sounds - Normal and Clear.  Cardiovascular Auscultation Rhythm - Regular. Murmurs & Other Heart Sounds - Auscultation of the heart reveals - No Murmurs and No Systolic Clicks.  Abdomen Inspection Inspection of the abdomen reveals - No Visible peristalsis and No Abnormal pulsations. Umbilicus - No Bleeding, No Urine drainage. Palpation/Percussion Palpation and Percussion of the abdomen reveal - Soft, Non Tender, No Rebound tenderness, No Rigidity (guarding) and No Cutaneous hyperesthesia. Note: Overweight & short waisted. Abdomen soft. Not distended. Mild distasis recti. No umbilical or other anterior abdominal wall hernias   Female Genitourinary Sexual Maturity Tanner 5 - Adult hair pattern. Note: No vaginal bleeding nor discharge   Peripheral Vascular Upper Extremity Inspection - Left - No Cyanotic nailbeds, Not Ischemic. Right - No Cyanotic nailbeds, Not  Ischemic.  Neurologic Neurologic evaluation reveals -normal attention span and ability to concentrate, able to name objects and repeat phrases. Appropriate fund of knowledge , normal sensation and normal coordination. Mental Status Affect - not angry, not paranoid. Cranial Nerves-Normal Bilaterally. Gait-Normal.  Neuropsychiatric Mental status exam performed with findings of-able to articulate well with normal speech/language, rate, volume and coherence, thought content normal with ability to perform basic computations and apply abstract reasoning and no evidence of hallucinations, delusions, obsessions or homicidal/suicidal ideation.  Musculoskeletal Global Assessment Spine, Ribs and Pelvis - no instability, subluxation or laxity. Right Upper Extremity - no instability, subluxation or laxity.  Lymphatic Head & Neck  General Head & Neck Lymphatics: Bilateral - Description - No Localized lymphadenopathy. Axillary  General Axillary Region: Bilateral - Description - No Localized lymphadenopathy. Femoral & Inguinal  Generalized Femoral & Inguinal Lymphatics: Left - Description - No Localized lymphadenopathy. Right - Description - No Localized lymphadenopathy.    Assessment & Plan HIATAL HERNIA WITH GERD (K21.9) Impression: She has a large hiatal hernia with intrathoracic stomach. She is symptomatic from it with dysphagia postprandial right-sided noncardiac chest pain, and reflux.  Her esophagus does not seem to be shortened, so I would like to get manometry to make sure that her chest pain and dysphagia has not thrown up primary esophageal disorder. Also be helpful to see if she can tolerate a complete fundoplication.  I think she would benefit from hiatal hernia repair. Minimally invasive approach. Robotic. Most likely a pledgeted repair and onlay biologic mesh. Phasix. Might need to do relaxing incision if extremely tight. Fundoplication for heartburn control. Nissen since  manometry is good. No need for pyloromyotomy as gastric emptying study is normal  Discussed the need for a pured diet the first few weeks especially in a gradual advancement. Esophageal surgery dietary sheet given. The patient has been no more than ready to proceed.  Its becoming more disruptive and concerning for her life.   Current Plans You are being scheduled for surgery- Our schedulers will call you.  You should hear from our office's scheduling department within 5 working days about the location, date, and time of surgery. We try to make accommodations for patient's preferences in scheduling surgery, but sometimes the OR schedule or the surgeon's schedule prevents [place] from making those accommodations.  If you have not heard from our office [phone]) in 5 working days, call the office and ask for your surgeon's nurse.  If you have other questions about your diagnosis, plan, or surgery, call the office and ask for your surgeon's nurse.  Follow Up - Call [facility] office after tests / studies doneto discuss further plans Pt Education - [facility] Esophageal Surgery Diet HCI (Taleeyah Bora): discussed with patient and provided information. Pt Education - [facility] Laparoscopic Surgery HCI The anatomy & physiology of the foregut and anti-reflux mechanism was discussed. The pathophysiology of hiatal herniation and GERD was discussed. Natural history risks without surgery was discussed. The patient's symptoms are not adequately controlled by medicines and other non-operative treatments. I feel the risks of no intervention will lead to serious problems that outweigh the operative risks; therefore, I recommended surgery to reduce the hiatal hernia out of the chest and fundoplication to rebuild the anti-reflux valve and control reflux better. Need for a thorough workup to rule out the differential diagnosis and plan treatment was explained. I explained laparoscopic techniques with possible need for an open  approach.  Risks such as bleeding, infection, abscess, leak, need for further treatment, heart attack, death, and other risks were discussed. I noted a good likelihood this will help address the problem. Goals of post-operative recovery were discussed as well. Possibility that this will not correct all symptoms was explained. Post-operative dysphagia, need for short-term liquid & pureed diet, inability to vomit, possibility of reherniation, possible need for medicines to help control symptoms in addition to surgery were discussed. We will work to minimize complications. Educational handouts further explaining the pathology, treatment options, and dysphagia diet was given as well. Questions were answered. The patient expresses understanding & wishes to proceed with surgery.  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 Gastrointestinal and Minimally Invasive Surgery [facility] [address], [address] [place], [place] [address] [phone] Main / Paging

## 2017-01-31 ENCOUNTER — Ambulatory Visit (HOSPITAL_COMMUNITY): Payer: Medicare Other | Admitting: Certified Registered Nurse Anesthetist

## 2017-01-31 ENCOUNTER — Inpatient Hospital Stay (HOSPITAL_COMMUNITY)
Admission: AD | Admit: 2017-01-31 | Discharge: 2017-02-03 | DRG: 328 | Disposition: A | Discharge status: Home / Self Care | Payer: Medicare Other | Source: Ambulatory Visit | Attending: Surgery | Admitting: Surgery

## 2017-01-31 ENCOUNTER — Other Ambulatory Visit: Payer: Self-pay

## 2017-01-31 ENCOUNTER — Encounter (HOSPITAL_COMMUNITY): Payer: Self-pay | Admitting: *Deleted

## 2017-01-31 ENCOUNTER — Encounter (HOSPITAL_COMMUNITY)
Admission: AD | Disposition: A | Discharge status: Home / Self Care | Payer: Self-pay | Source: Ambulatory Visit | Attending: Surgery

## 2017-01-31 DIAGNOSIS — K219 Gastro-esophageal reflux disease without esophagitis: Secondary | ICD-10-CM | POA: Diagnosis present

## 2017-01-31 DIAGNOSIS — K3189 Other diseases of stomach and duodenum: Secondary | ICD-10-CM | POA: Diagnosis present

## 2017-01-31 DIAGNOSIS — K5900 Constipation, unspecified: Secondary | ICD-10-CM | POA: Diagnosis not present

## 2017-01-31 DIAGNOSIS — E669 Obesity, unspecified: Secondary | ICD-10-CM | POA: Diagnosis present

## 2017-01-31 DIAGNOSIS — K439 Ventral hernia without obstruction or gangrene: Secondary | ICD-10-CM | POA: Diagnosis not present

## 2017-01-31 DIAGNOSIS — I10 Essential (primary) hypertension: Secondary | ICD-10-CM | POA: Diagnosis not present

## 2017-01-31 DIAGNOSIS — Z6831 Body mass index (BMI) 31.0-31.9, adult: Secondary | ICD-10-CM

## 2017-01-31 DIAGNOSIS — K449 Diaphragmatic hernia without obstruction or gangrene: Secondary | ICD-10-CM

## 2017-01-31 DIAGNOSIS — R1310 Dysphagia, unspecified: Secondary | ICD-10-CM | POA: Diagnosis present

## 2017-01-31 DIAGNOSIS — K562 Volvulus: Secondary | ICD-10-CM | POA: Diagnosis not present

## 2017-01-31 DIAGNOSIS — K440 Diaphragmatic hernia with obstruction, without gangrene: Principal | ICD-10-CM | POA: Diagnosis present

## 2017-01-31 HISTORY — DX: Other diseases of stomach and duodenum: K31.89

## 2017-01-31 HISTORY — PX: INSERTION OF MESH: SHX5868

## 2017-01-31 LAB — POCT I-STAT 4, (NA,K, GLUC, HGB,HCT)
GLUCOSE: 100 mg/dL — AB (ref 65–99)
HEMATOCRIT: 43 % (ref 36.0–46.0)
Hemoglobin: 14.6 g/dL (ref 12.0–15.0)
POTASSIUM: 3.2 mmol/L — AB (ref 3.5–5.1)
SODIUM: 141 mmol/L (ref 135–145)

## 2017-01-31 SURGERY — FUNDOPLICATION, NISSEN, ROBOT-ASSISTED, LAPAROSCOPIC
Anesthesia: General | Site: Abdomen

## 2017-01-31 MED ORDER — PHENYLEPHRINE HCL 10 MG/ML IJ SOLN
INTRAMUSCULAR | Status: AC
  Filled 2017-01-31: qty 1

## 2017-01-31 MED ORDER — SUGAMMADEX SODIUM 200 MG/2ML IV SOLN
INTRAVENOUS | Status: AC
  Filled 2017-01-31: qty 2

## 2017-01-31 MED ORDER — SIMETHICONE 80 MG PO CHEW: 40.0000 mg | CHEWABLE_TABLET | Freq: Four times a day (QID) | ORAL | Status: DC | PRN

## 2017-01-31 MED ORDER — ONDANSETRON HCL 4 MG/2ML IJ SOLN: 4.0000 mg | Freq: Once | INTRAMUSCULAR | Status: DC | PRN

## 2017-01-31 MED ORDER — LIDOCAINE 2% (20 MG/ML) 5 ML SYRINGE
INTRAMUSCULAR | Status: DC | PRN
  Administered 2017-01-31: 1.5 mg/kg/h via INTRAVENOUS

## 2017-01-31 MED ORDER — METHOCARBAMOL 500 MG PO TABS: 500.0000 mg | ORAL_TABLET | Freq: Four times a day (QID) | ORAL | Status: DC | PRN

## 2017-01-31 MED ORDER — LACTATED RINGERS IV SOLN
INTRAVENOUS | Status: DC | PRN
Start: 2017-01-31 — End: 2017-01-31
  Administered 2017-01-31 (×2): via INTRAVENOUS

## 2017-01-31 MED ORDER — GABAPENTIN 300 MG PO CAPS
300.0000 mg | ORAL_CAPSULE | ORAL | Status: AC
  Administered 2017-01-31: 300 mg via ORAL
  Filled 2017-01-31: qty 1

## 2017-01-31 MED ORDER — POLYETHYLENE GLYCOL 3350 17 G PO PACK
17.0000 g | PACK | Freq: Every day | ORAL | Status: DC
  Administered 2017-02-01 – 2017-02-02 (×2): 17 g via ORAL
  Filled 2017-01-31 (×2): qty 1

## 2017-01-31 MED ORDER — BISACODYL 10 MG RE SUPP: 10.0000 mg | Freq: Every day | RECTAL | Status: DC | PRN

## 2017-01-31 MED ORDER — DEXTROSE 5 % IV SOLN
2.0000 g | INTRAVENOUS | Status: AC
  Administered 2017-01-31: 2 g via INTRAVENOUS

## 2017-01-31 MED ORDER — DEXTROSE 5 % IV SOLN
INTRAVENOUS | Status: AC
  Filled 2017-01-31: qty 2

## 2017-01-31 MED ORDER — KETAMINE HCL 10 MG/ML IJ SOLN
INTRAMUSCULAR | Status: DC | PRN
  Administered 2017-01-31: 10 mg via INTRAVENOUS
  Administered 2017-01-31: 25 mg via INTRAVENOUS

## 2017-01-31 MED ORDER — ONDANSETRON HCL 4 MG/2ML IJ SOLN
INTRAMUSCULAR | Status: AC
  Filled 2017-01-31: qty 2

## 2017-01-31 MED ORDER — ONDANSETRON 4 MG PO TBDP
4.0000 mg | ORAL_TABLET | Freq: Four times a day (QID) | ORAL | Status: DC | PRN
Start: 2017-01-31 — End: 2017-02-03

## 2017-01-31 MED ORDER — OXYCODONE HCL 5 MG PO TABS: 5.0000 mg | ORAL_TABLET | Freq: Four times a day (QID) | ORAL | 0 refills | Status: DC | PRN

## 2017-01-31 MED ORDER — SODIUM CHLORIDE 0.9% FLUSH
3.0000 mL | Freq: Two times a day (BID) | INTRAVENOUS | Status: DC
  Administered 2017-02-01 – 2017-02-02 (×3): 3 mL via INTRAVENOUS

## 2017-01-31 MED ORDER — LACTATED RINGERS IR SOLN
Status: DC | PRN
  Administered 2017-01-31: 1000 mL

## 2017-01-31 MED ORDER — ONDANSETRON HCL 4 MG PO TABS: 4.0000 mg | ORAL_TABLET | Freq: Three times a day (TID) | ORAL | 5 refills | Status: DC | PRN

## 2017-01-31 MED ORDER — PROMETHAZINE HCL 25 MG RE SUPP: 25.0000 mg | Freq: Four times a day (QID) | RECTAL | 5 refills | Status: DC | PRN

## 2017-01-31 MED ORDER — ONDANSETRON HCL 4 MG/2ML IJ SOLN
INTRAMUSCULAR | Status: DC | PRN
  Administered 2017-01-31: 4 mg via INTRAVENOUS

## 2017-01-31 MED ORDER — BUPIVACAINE LIPOSOME 1.3 % IJ SUSP
20.0000 mL | Freq: Once | INTRAMUSCULAR | Status: DC
  Filled 2017-01-31: qty 20

## 2017-01-31 MED ORDER — SCOPOLAMINE 1 MG/3DAYS TD PT72
1.0000 | MEDICATED_PATCH | TRANSDERMAL | Status: DC
  Administered 2017-01-31: 1.5 mg via TRANSDERMAL
  Filled 2017-01-31: qty 1

## 2017-01-31 MED ORDER — LIDOCAINE 2% (20 MG/ML) 5 ML SYRINGE
INTRAMUSCULAR | Status: DC | PRN
  Administered 2017-01-31: 60 mg via INTRAVENOUS

## 2017-01-31 MED ORDER — HYDROCORTISONE 1 % EX CREA
1.0000 | TOPICAL_CREAM | Freq: Three times a day (TID) | CUTANEOUS | Status: DC | PRN
Start: 2017-01-31 — End: 2017-02-03

## 2017-01-31 MED ORDER — DEXAMETHASONE SODIUM PHOSPHATE 10 MG/ML IJ SOLN: INTRAMUSCULAR | Status: DC | PRN

## 2017-01-31 MED ORDER — LACTATED RINGERS IV SOLN
1000.0000 mL | Freq: Three times a day (TID) | INTRAVENOUS | Status: AC | PRN
Start: 2017-01-31 — End: 2017-02-02

## 2017-01-31 MED ORDER — ROCURONIUM BROMIDE 50 MG/5ML IV SOSY
PREFILLED_SYRINGE | INTRAVENOUS | Status: AC
  Filled 2017-01-31: qty 5

## 2017-01-31 MED ORDER — PHENYLEPHRINE HCL 10 MG/ML IJ SOLN
INTRAMUSCULAR | Status: DC | PRN
  Administered 2017-01-31: 40 ug/min via INTRAVENOUS

## 2017-01-31 MED ORDER — ALUM & MAG HYDROXIDE-SIMETH 200-200-20 MG/5ML PO SUSP: 30.0000 mL | Freq: Four times a day (QID) | ORAL | Status: DC | PRN

## 2017-01-31 MED ORDER — DIPHENHYDRAMINE HCL 50 MG/ML IJ SOLN: 12.5000 mg | Freq: Four times a day (QID) | INTRAMUSCULAR | Status: DC | PRN

## 2017-01-31 MED ORDER — BISACODYL 10 MG RE SUPP: 10.0000 mg | Freq: Two times a day (BID) | RECTAL | Status: DC | PRN

## 2017-01-31 MED ORDER — ONDANSETRON HCL 4 MG/2ML IJ SOLN: 4.0000 mg | Freq: Four times a day (QID) | INTRAMUSCULAR | Status: DC | PRN

## 2017-01-31 MED ORDER — GABAPENTIN 250 MG/5ML PO SOLN
300.0000 mg | Freq: Every day | ORAL | Status: DC
  Administered 2017-01-31 – 2017-02-01 (×2): 300 mg via ORAL
  Filled 2017-01-31 (×3): qty 6

## 2017-01-31 MED ORDER — LIP MEDEX EX OINT
1.0000 "application " | TOPICAL_OINTMENT | Freq: Two times a day (BID) | CUTANEOUS | Status: DC
  Administered 2017-01-31 – 2017-02-01 (×2): 1 via TOPICAL
  Filled 2017-01-31 (×2): qty 7

## 2017-01-31 MED ORDER — FENTANYL CITRATE (PF) 100 MCG/2ML IJ SOLN
INTRAMUSCULAR | Status: AC
  Filled 2017-01-31: qty 4

## 2017-01-31 MED ORDER — DIPHENHYDRAMINE HCL 12.5 MG/5ML PO ELIX: 12.5000 mg | ORAL_SOLUTION | Freq: Four times a day (QID) | ORAL | Status: DC | PRN

## 2017-01-31 MED ORDER — METOCLOPRAMIDE HCL 5 MG PO TABS: 5.0000 mg | ORAL_TABLET | Freq: Four times a day (QID) | ORAL | Status: DC | PRN

## 2017-01-31 MED ORDER — 0.9 % SODIUM CHLORIDE (POUR BTL) OPTIME
TOPICAL | Status: DC | PRN
  Administered 2017-01-31: 1000 mL

## 2017-01-31 MED ORDER — METOCLOPRAMIDE HCL 5 MG/ML IJ SOLN: 5.0000 mg | Freq: Four times a day (QID) | INTRAMUSCULAR | Status: DC | PRN

## 2017-01-31 MED ORDER — LIDOCAINE 2% (20 MG/ML) 5 ML SYRINGE
INTRAMUSCULAR | Status: AC
  Filled 2017-01-31: qty 5

## 2017-01-31 MED ORDER — FAMOTIDINE 20 MG PO TABS
20.0000 mg | ORAL_TABLET | Freq: Two times a day (BID) | ORAL | Status: DC
  Administered 2017-01-31 – 2017-02-03 (×6): 20 mg via ORAL
  Filled 2017-01-31 (×6): qty 1

## 2017-01-31 MED ORDER — SUGAMMADEX SODIUM 200 MG/2ML IV SOLN
INTRAVENOUS | Status: DC | PRN
  Administered 2017-01-31: 200 mg via INTRAVENOUS

## 2017-01-31 MED ORDER — ROCURONIUM BROMIDE 50 MG/5ML IV SOSY
PREFILLED_SYRINGE | INTRAVENOUS | Status: DC | PRN
  Administered 2017-01-31 (×3): 10 mg via INTRAVENOUS
  Administered 2017-01-31: 50 mg via INTRAVENOUS

## 2017-01-31 MED ORDER — HYDROMORPHONE HCL 1 MG/ML IJ SOLN
0.5000 mg | INTRAMUSCULAR | Status: DC | PRN
  Administered 2017-01-31: 1 mg via INTRAVENOUS
  Filled 2017-01-31: qty 1

## 2017-01-31 MED ORDER — SODIUM CHLORIDE 0.9 % IV SOLN
25.0000 mg | Freq: Four times a day (QID) | INTRAVENOUS | Status: DC | PRN
  Filled 2017-01-31: qty 1

## 2017-01-31 MED ORDER — PROPOFOL 10 MG/ML IV BOLUS
INTRAVENOUS | Status: DC | PRN
Start: 2017-01-31 — End: 2017-01-31
  Administered 2017-01-31: 130 mg via INTRAVENOUS

## 2017-01-31 MED ORDER — GABAPENTIN 300 MG PO CAPS: 300.0000 mg | ORAL_CAPSULE | Freq: Every day | ORAL | Status: DC

## 2017-01-31 MED ORDER — KETAMINE HCL 10 MG/ML IJ SOLN
INTRAMUSCULAR | Status: AC
  Filled 2017-01-31: qty 1

## 2017-01-31 MED ORDER — SODIUM CHLORIDE 0.9 % IV SOLN
250.0000 mL | INTRAVENOUS | Status: DC | PRN
  Administered 2017-02-01: 250 mL via INTRAVENOUS

## 2017-01-31 MED ORDER — ACETAMINOPHEN 500 MG PO TABS
1000.0000 mg | ORAL_TABLET | ORAL | Status: AC
  Administered 2017-01-31: 1000 mg via ORAL
  Filled 2017-01-31: qty 2

## 2017-01-31 MED ORDER — BUPIVACAINE-EPINEPHRINE 0.25% -1:200000 IJ SOLN
INTRAMUSCULAR | Status: AC
  Filled 2017-01-31: qty 1

## 2017-01-31 MED ORDER — DEXAMETHASONE SODIUM PHOSPHATE 10 MG/ML IJ SOLN
INTRAMUSCULAR | Status: AC
  Filled 2017-01-31: qty 1

## 2017-01-31 MED ORDER — SODIUM CHLORIDE 0.9% FLUSH: 3.0000 mL | INTRAVENOUS | Status: DC | PRN

## 2017-01-31 MED ORDER — SODIUM CHLORIDE 0.9 % IV SOLN
INTRAVENOUS | Status: AC
  Administered 2017-01-31: 16:00:00 via INTRAVENOUS

## 2017-01-31 MED ORDER — MENTHOL 3 MG MT LOZG: 1.0000 | LOZENGE | OROMUCOSAL | Status: DC | PRN

## 2017-01-31 MED ORDER — CHLORTHALIDONE 25 MG PO TABS
25.0000 mg | ORAL_TABLET | Freq: Every day | ORAL | Status: DC | PRN
  Filled 2017-01-31: qty 1

## 2017-01-31 MED ORDER — ZOLPIDEM TARTRATE 5 MG PO TABS: 5.0000 mg | ORAL_TABLET | Freq: Every evening | ORAL | Status: DC | PRN

## 2017-01-31 MED ORDER — GUAIFENESIN-DM 100-10 MG/5ML PO SYRP: 10.0000 mL | ORAL_SOLUTION | ORAL | Status: DC | PRN

## 2017-01-31 MED ORDER — FENTANYL CITRATE (PF) 100 MCG/2ML IJ SOLN
INTRAMUSCULAR | Status: DC | PRN
  Administered 2017-01-31 (×4): 50 ug via INTRAVENOUS

## 2017-01-31 MED ORDER — PROPOFOL 10 MG/ML IV BOLUS
INTRAVENOUS | Status: AC
  Filled 2017-01-31: qty 20

## 2017-01-31 MED ORDER — POLYETHYLENE GLYCOL 3350 17 G PO PACK: 17.0000 g | PACK | Freq: Every day | ORAL | Status: DC | PRN

## 2017-01-31 MED ORDER — FENTANYL CITRATE (PF) 100 MCG/2ML IJ SOLN
25.0000 ug | INTRAMUSCULAR | Status: DC | PRN
  Administered 2017-01-31 (×2): 50 ug via INTRAVENOUS

## 2017-01-31 MED ORDER — ENSURE SURGERY PO LIQD
237.0000 mL | Freq: Two times a day (BID) | ORAL | Status: DC
Start: 2017-01-31 — End: 2017-02-03
  Filled 2017-01-31 (×7): qty 237

## 2017-01-31 MED ORDER — ARTIFICIAL TEARS OPHTHALMIC OINT
TOPICAL_OINTMENT | OPHTHALMIC | Status: AC
  Filled 2017-01-31: qty 3.5

## 2017-01-31 MED ORDER — METRONIDAZOLE IN NACL 5-0.79 MG/ML-% IV SOLN
500.0000 mg | INTRAVENOUS | Status: AC
  Administered 2017-01-31: 500 mg via INTRAVENOUS
  Filled 2017-01-31: qty 100

## 2017-01-31 MED ORDER — LIDOCAINE HCL 2 % IJ SOLN
INTRAMUSCULAR | Status: AC
  Filled 2017-01-31: qty 20

## 2017-01-31 MED ORDER — ENSURE SURGERY PO LIQD
237.0000 mL | Freq: Two times a day (BID) | ORAL | Status: DC
  Filled 2017-01-31: qty 237

## 2017-01-31 MED ORDER — HYDROCORTISONE 2.5 % RE CREA: 1.0000 "application " | TOPICAL_CREAM | Freq: Four times a day (QID) | RECTAL | Status: DC | PRN

## 2017-01-31 MED ORDER — BUPIVACAINE-EPINEPHRINE 0.25% -1:200000 IJ SOLN
INTRAMUSCULAR | Status: DC | PRN
  Administered 2017-01-31: 50 mL

## 2017-01-31 MED ORDER — OXYCODONE HCL 5 MG PO TABS
5.0000 mg | ORAL_TABLET | ORAL | Status: DC | PRN
  Administered 2017-02-01: 10 mg via ORAL
  Administered 2017-02-03: 5 mg via ORAL
  Filled 2017-01-31 (×3): qty 1

## 2017-01-31 MED ORDER — ENOXAPARIN SODIUM 40 MG/0.4ML ~~LOC~~ SOLN
40.0000 mg | SUBCUTANEOUS | Status: DC
  Administered 2017-02-01 – 2017-02-03 (×3): 40 mg via SUBCUTANEOUS
  Filled 2017-01-31 (×3): qty 0.4

## 2017-01-31 MED ORDER — SUCCINYLCHOLINE CHLORIDE 200 MG/10ML IV SOSY
PREFILLED_SYRINGE | INTRAVENOUS | Status: AC
  Filled 2017-01-31: qty 10

## 2017-01-31 MED ORDER — FENTANYL CITRATE (PF) 250 MCG/5ML IJ SOLN
INTRAMUSCULAR | Status: AC
  Filled 2017-01-31: qty 5

## 2017-01-31 MED ORDER — ACETAMINOPHEN 500 MG PO TABS
1000.0000 mg | ORAL_TABLET | Freq: Three times a day (TID) | ORAL | Status: DC
  Administered 2017-01-31: 1000 mg via ORAL
  Filled 2017-01-31: qty 2

## 2017-01-31 MED ORDER — ACETAMINOPHEN 160 MG/5ML PO SOLN
1000.0000 mg | Freq: Three times a day (TID) | ORAL | Status: DC
  Administered 2017-01-31 – 2017-02-02 (×5): 1000 mg via ORAL
  Filled 2017-01-31 (×7): qty 40.6

## 2017-01-31 MED ORDER — LACTATED RINGERS IV SOLN: INTRAVENOUS | Status: DC

## 2017-01-31 MED ORDER — PHENOL 1.4 % MT LIQD: 1.0000 | OROMUCOSAL | Status: DC | PRN

## 2017-01-31 MED ORDER — POLYVINYL ALCOHOL 1.4 % OP SOLN
1.0000 [drp] | Freq: Four times a day (QID) | OPHTHALMIC | Status: DC | PRN
  Administered 2017-01-31: 1 [drp] via OPHTHALMIC
  Filled 2017-01-31: qty 15

## 2017-01-31 MED ORDER — HYDRALAZINE HCL 20 MG/ML IJ SOLN: 5.0000 mg | INTRAMUSCULAR | Status: DC | PRN

## 2017-01-31 MED ORDER — DEXAMETHASONE SODIUM PHOSPHATE 4 MG/ML IJ SOLN
4.0000 mg | INTRAMUSCULAR | Status: AC
  Administered 2017-01-31: 4 mg via INTRAVENOUS

## 2017-01-31 MED ORDER — MAGIC MOUTHWASH
15.0000 mL | Freq: Four times a day (QID) | ORAL | Status: DC | PRN
  Filled 2017-01-31: qty 15

## 2017-01-31 SURGICAL SUPPLY — 65 items
APPLIER CLIP 5 13 M/L LIGAMAX5 (MISCELLANEOUS)
APPLIER CLIP ROT 10 11.4 M/L (STAPLE)
BLADE SURG SZ11 CARB STEEL (BLADE) ×3 IMPLANT
CANNULA REDUC XI 12-8 STAPL (CANNULA) ×1
CANNULA REDUC XI 12-8MM STAPL (CANNULA) ×1
CANNULA REDUCER 12-8 DVNC XI (CANNULA) ×1 IMPLANT
CHLORAPREP W/TINT 26ML (MISCELLANEOUS) ×3 IMPLANT
CLIP APPLIE 5 13 M/L LIGAMAX5 (MISCELLANEOUS) IMPLANT
CLIP APPLIE ROT 10 11.4 M/L (STAPLE) IMPLANT
COVER SURGICAL LIGHT HANDLE (MISCELLANEOUS) ×3 IMPLANT
COVER TIP SHEARS 8 DVNC (MISCELLANEOUS) ×1 IMPLANT
COVER TIP SHEARS 8MM DA VINCI (MISCELLANEOUS) ×2
DECANTER SPIKE VIAL GLASS SM (MISCELLANEOUS) ×3 IMPLANT
DRAIN CHANNEL 19F RND (DRAIN) ×3 IMPLANT
DRAIN PENROSE 18X1/2 LTX STRL (DRAIN) IMPLANT
DRAPE ARM DVNC X/XI (DISPOSABLE) ×4 IMPLANT
DRAPE COLUMN DVNC XI (DISPOSABLE) ×1 IMPLANT
DRAPE DA VINCI XI ARM (DISPOSABLE) ×8
DRAPE DA VINCI XI COLUMN (DISPOSABLE) ×2
DRAPE WARM FLUID 44X44 (DRAPE) IMPLANT
DRSG TEGADERM 2-3/8X2-3/4 SM (GAUZE/BANDAGES/DRESSINGS) ×18 IMPLANT
DRSG TEGADERM 4X4.75 (GAUZE/BANDAGES/DRESSINGS) ×3 IMPLANT
ELECT REM PT RETURN 15FT ADLT (MISCELLANEOUS) ×3 IMPLANT
ENDOLOOP SUT PDS II  0 18 (SUTURE)
ENDOLOOP SUT PDS II 0 18 (SUTURE) IMPLANT
EVACUATOR SILICONE 100CC (DRAIN) ×3 IMPLANT
FELT TEFLON 4 X1 (Mesh General) ×3 IMPLANT
GAUZE SPONGE 2X2 8PLY STRL LF (GAUZE/BANDAGES/DRESSINGS) ×1 IMPLANT
GLOVE ECLIPSE 8.0 STRL XLNG CF (GLOVE) ×9 IMPLANT
GLOVE INDICATOR 8.0 STRL GRN (GLOVE) ×9 IMPLANT
GOWN STRL REUS W/TWL XL LVL3 (GOWN DISPOSABLE) ×15 IMPLANT
IRRIG SUCT STRYKERFLOW 2 WTIP (MISCELLANEOUS) ×3
IRRIGATION SUCT STRKRFLW 2 WTP (MISCELLANEOUS) ×1 IMPLANT
KIT BASIN OR (CUSTOM PROCEDURE TRAY) ×3 IMPLANT
MESH PHASIX RESORB RECT 10X15 (Mesh General) ×3 IMPLANT
NEEDLE HYPO 22GX1.5 SAFETY (NEEDLE) ×3 IMPLANT
NEEDLE INSUFFLATION 14GA 120MM (NEEDLE) ×3 IMPLANT
PACK CARDIOVASCULAR III (CUSTOM PROCEDURE TRAY) ×3 IMPLANT
PAD POSITIONING PINK XL (MISCELLANEOUS) ×3 IMPLANT
SCISSORS LAP 5X45 EPIX DISP (ENDOMECHANICALS) ×3 IMPLANT
SEAL CANN UNIV 5-8 DVNC XI (MISCELLANEOUS) ×4 IMPLANT
SEAL XI 5MM-8MM UNIVERSAL (MISCELLANEOUS) ×8
SEALER VESSEL DA VINCI XI (MISCELLANEOUS) ×2
SEALER VESSEL EXT DVNC XI (MISCELLANEOUS) ×1 IMPLANT
SOLUTION ANTI FOG 6CC (MISCELLANEOUS) ×3 IMPLANT
SOLUTION ELECTROLUBE (MISCELLANEOUS) ×3 IMPLANT
SPONGE GAUZE 2X2 STER 10/PKG (GAUZE/BANDAGES/DRESSINGS) ×2
SPONGE LAP 18X18 X RAY DECT (DISPOSABLE) ×3 IMPLANT
SUT ETHIBOND 0 36 GRN (SUTURE) ×9 IMPLANT
SUT ETHIBOND NAB CT1 #1 30IN (SUTURE) ×9 IMPLANT
SUT MNCRL AB 4-0 PS2 18 (SUTURE) ×3 IMPLANT
SUT PROLENE 2 0 SH DA (SUTURE) ×3 IMPLANT
SUT V-LOC BARB 180 2/0GR6 GS22 (SUTURE)
SUTURE V-LC BRB 180 2/0GR6GS22 (SUTURE) IMPLANT
SYR 10ML LL (SYRINGE) ×3 IMPLANT
SYR 20CC LL (SYRINGE) ×3 IMPLANT
TIP INNERVISION DETACH 40FR (MISCELLANEOUS) IMPLANT
TIP INNERVISION DETACH 50FR (MISCELLANEOUS) IMPLANT
TIP INNERVISION DETACH 56FR (MISCELLANEOUS) IMPLANT
TIPS INNERVISION DETACH 40FR (MISCELLANEOUS)
TOWEL OR 17X26 10 PK STRL BLUE (TOWEL DISPOSABLE) ×3 IMPLANT
TOWEL OR NON WOVEN STRL DISP B (DISPOSABLE) ×3 IMPLANT
TRAY FOLEY W/METER SILVER 16FR (SET/KITS/TRAYS/PACK) IMPLANT
TROCAR ADV FIXATION 5X100MM (TROCAR) ×3 IMPLANT
TUBING INSUFFLATION 10FT LAP (TUBING) ×3 IMPLANT

## 2017-01-31 NOTE — Anesthesia Procedure Notes (Signed)
Procedure Name: Intubation Date/Time: 01/31/2017 8:39 AM Performed by: Montel Clock, CRNA Pre-anesthesia Checklist: Patient identified, Emergency Drugs available, Suction available, Patient being monitored and Timeout performed Patient Re-evaluated:Patient Re-evaluated prior to induction Oxygen Delivery Method: Circle system utilized Preoxygenation: Pre-oxygenation with 100% oxygen Induction Type: IV induction Ventilation: Mask ventilation without difficulty and Oral airway inserted - appropriate to patient size Laryngoscope Size: Mac and 3 Grade View: Grade II Tube type: Oral Tube size: 7.0 mm Number of attempts: 1 Airway Equipment and Method: Stylet Placement Confirmation: ETT inserted through vocal cords under direct vision,  positive ETCO2 and breath sounds checked- equal and bilateral Secured at: 21 cm Tube secured with: Tape Dental Injury: Teeth and Oropharynx as per pre-operative assessment

## 2017-01-31 NOTE — Anesthesia Postprocedure Evaluation (Signed)
Anesthesia Post Note  Patient: Sydney [name]  Procedure(s) Performed: ROBOTIC REDUCTION AND REPAIR OF PARAESOPHAGEAL HIATAL HERNIA WITH NISSEN FUNDOPLICATION (N/A Abdomen) INSERTION OF MESH (N/A Abdomen)     Patient location during evaluation: PACU Anesthesia Type: General Level of consciousness: awake and alert Pain management: pain level controlled Vital Signs Assessment: post-procedure vital signs reviewed and stable Respiratory status: spontaneous breathing, nonlabored ventilation, respiratory function stable and patient connected to nasal cannula oxygen Cardiovascular status: blood pressure returned to baseline and stable Postop Assessment: no apparent nausea or vomiting Anesthetic complications: no    Last Vitals:  Vitals:   01/31/17 1350 01/31/17 1450  BP: 140/71 132/63  Pulse: 88 79  Resp: 18 18  Temp: 36.5 C 36.6 C  SpO2: 98% 99%    Last Pain:  Vitals:   01/31/17 1450  TempSrc: Oral  PainSc:                  Ryan P Ellender

## 2017-01-31 NOTE — Transfer of Care (Signed)
Immediate Anesthesia Transfer of Care Note  Patient: JEANEE FABRE  Procedure(s) Performed: ROBOTIC REDUCTION AND REPAIR OF PARAESOPHAGEAL HIATAL HERNIA WITH NISSEN FUNDOPLICATION (N/A Abdomen) INSERTION OF MESH (N/A Abdomen)  Patient Location: PACU  Anesthesia Type:General  Level of Consciousness: sedated and patient cooperative  Airway & Oxygen Therapy: Patient Spontanous Breathing and Patient connected to face mask oxygen  Post-op Assessment: Report given to RN and Post -op Vital signs reviewed and stable  Post vital signs: Reviewed and stable  Last Vitals: There were no vitals filed for this visit.  Last Pain: There were no vitals filed for this visit.    Patients Stated Pain Goal: 5 (01/31/17 0867)  Complications: No apparent anesthesia complications

## 2017-01-31 NOTE — Discharge Instructions (Signed)
EATING AFTER YOUR ESOPHAGEAL SURGERY °(Stomach Fundoplication, Hiatal Hernia repair, Achalasia surgery, etc) ° °###################################################################### ° °EAT °Start with a pureed / full liquid diet (see below) °Gradually transition to a high fiber diet with a fiber supplement over the next month after discharge.   ° °WALK °Walk an hour a day.  Control your pain to do that.   ° °CONTROL PAIN °Control pain so that you can walk, sleep, tolerate sneezing/coughing, go up/down stairs. ° °HAVE A BOWEL MOVEMENT DAILY °Keep your bowels regular to avoid problems.  OK to try a laxative to override constipation.  OK to use an antidairrheal to slow down diarrhea.  Call if not better after 2 tries ° °CALL IF YOU HAVE PROBLEMS/CONCERNS °Call if you are still struggling despite following these instructions. °Call if you have concerns not answered by these instructions ° °###################################################################### ° ° °After your esophageal surgery, expect some sticking with swallowing over the next 1-2 months.   ° °If food sticks when you eat, it is called "dysphagia".  This is due to swelling around your esophagus at the wrap & hiatal diaphragm repair.  It will gradually ease off over the next few months.  To help you through this temporary phase, we start you out on a pureed (blenderized) diet. ° °Your first meal in the hospital was thin liquids.  You should have been given a pureed diet by the time you left the hospital.  We ask patients to stay on a pureed diet for the first 2-3 weeks to avoid anything getting "stuck" near your recent surgery.  Don't be alarmed if your ability to swallow doesn't progress according to this plan.  Everyone is different and some diets can advance more or less quickly.   ° ° °Some BASIC RULES to follow are: °· Maintain an upright position whenever eating or drinking. °· Take small bites - just a teaspoon size bite at a time. °· Eat slowly.   It may also help to eat only one food at a time. °· Consider nibbling through smaller, more frequent meals & avoid the urge to eat BIG meals °· Do not push through feelings of fullness, nausea, or bloatedness °· Do not mix solid foods and liquids in the same mouthful °· Try not to "wash foods down" with large gulps of liquids. °· Avoid carbonated (bubbly/fizzy) drinks.   °· Avoid foods that make you feel gassy or bloated.  Start with bland foods first.  Wait on trying greasy, fried, or spicy meals until you are tolerating more bland solids well. °· Understand that it will be hard to burp and belch at first.  This gradually improves with time.  Expect to be more gassy/flatulent/bloated initially.  Walking will help your body manage it better. °· Consider using medications for bloating that contain simethicone such as  Maalox or Gas-X  °· Eat in a relaxed atmosphere & minimize distractions. °· Avoid talking while eating.   °· Do not use straws. °· Following each meal, sit in an upright position (90 degree angle) for 60 to 90 minutes.  Going for a short walk can help as well °· If food does stick, don't panic.  Try to relax and let the food pass on its own.  Sipping WARM LIQUID such as strong hot black tea can also help slide it down. ° ° °Be gradual in changes & use common sense: ° °-If you easily tolerating a certain "level" of foods, advance to the next level gradually °-If you are   having trouble swallowing a particular food, then avoid it.   °-If food is sticking when you advance your diet, go back to thinner previous diet (the lower LEVEL) for 1-2 days. ° °LEVEL 1 = PUREED DIET ° °Do for the first 2 WEEKS AFTER SURGERY ° °-Foods in this group are pureed or blenderized to a smooth, mashed potato-like consistency.  °-If necessary, the pureed foods can keep their shape with the addition of a thickening agent.   °-Meat should be pureed to a smooth, pasty consistency.  Hot broth or gravy may be added to the pureed  meat, approximately 1 oz. of liquid per 3 oz. serving of meat. °-CAUTION:  If any foods do not puree into a smooth consistency, swallowing will be more difficult.  (For example, nuts or seeds sometimes do not blend well.) ° °Hot Foods Cold Foods  °Pureed scrambled eggs and cheese Pureed cottage cheese  °Baby cereals Thickened juices and nectars  °Thinned cooked cereals (no lumps) Thickened milk or eggnog  °Pureed French toast or pancakes Ensure  °Mashed potatoes Ice cream  °Pureed parsley, au gratin, scalloped potatoes, candied sweet potatoes Fruit or Italian ice, sherbet  °Pureed buttered or alfredo noodles Plain yogurt  °Pureed vegetables (no corn or peas) Instant breakfast  °Pureed soups and creamed soups Smooth pudding, mousse, custard  °Pureed scalloped apples Whipped gelatin  °Gravies Sugar, syrup, honey, jelly  °Sauces, cheese, tomato, barbecue, white, creamed Cream  °Any baby food Creamer  °Alcohol in moderation (not beer or champagne) Margarine  °Coffee or tea Mayonnaise  ° Ketchup, mustard  ° Apple sauce  ° °SAMPLE MENU:  PUREED DIET °Breakfast Lunch Dinner  °· Orange juice, 1/2 cup °· Cream of wheat, 1/2 cup · Pineapple juice, 1/2 cup · Pureed turkey, barley soup, 3/4 cup °· Pureed Hawaiian chicken, 3 oz  °· Scrambled eggs, mashed or blended with cheese, 1/2 cup °· Tea or coffee, 1 cup  °· Whole milk, 1 cup  °· Non-dairy creamer, 2 Tbsp. · Mashed potatoes, 1/2 cup °· Pureed cooled broccoli, 1/2 cup °· Apple sauce, 1/2 cup °· Coffee or tea · Mashed potatoes, 1/2 cup °· Pureed spinach, 1/2 cup °· Frozen yogurt, 1/2 cup °· Tea or coffee  ° ° ° ° °LEVEL 2 = SOFT DIET ° °After your first 2 weeks, you can advance to a soft diet.   °Keep on this diet until everything goes down easily. ° °Hot Foods Cold Foods  °White fish Cottage cheese  °Stuffed fish Junior baby fruit  °Baby food meals Semi thickened juices  °Minced soft cooked, scrambled, poached eggs nectars  °Souffle & omelets Ripe mashed bananas  °Cooked  cereals Canned fruit, pineapple sauce, milk  °potatoes Milkshake  °Buttered or Alfredo noodles Custard  °Cooked cooled vegetable Puddings, including tapioca  °Sherbet Yogurt  °Vegetable soup or alphabet soup Fruit ice, Italian ice  °Gravies Whipped gelatin  °Sugar, syrup, honey, jelly Junior baby desserts  °Sauces:  Cheese, creamed, barbecue, tomato, white Cream  °Coffee or tea Margarine  ° °SAMPLE MENU:  LEVEL 2 °Breakfast Lunch Dinner  °· Orange juice, 1/2 cup °· Oatmeal, 1/2 cup °· Scrambled eggs with cheese, 1/2 cup °· Decaffeinated tea, 1 cup °· Whole milk, 1 cup °· Non-dairy creamer, 2 Tbsp · Pineapple juice, 1/2 cup °· Minced beef, 3 oz °· Gravy, 2 Tbsp °· Mashed potatoes, 1/2 cup °· Minced fresh broccoli, 1/2 cup °· Applesauce, 1/2 cup °· Coffee, 1 cup · Turkey, barley soup, 3/4 cup °·   Minced Hawaiian chicken, 3 oz °· Mashed potatoes, 1/2 cup °· Cooked spinach, 1/2 cup °· Frozen yogurt, 1/2 cup °· Non-dairy creamer, 2 Tbsp  ° ° ° ° °LEVEL 3 = CHOPPED DIET ° °-After all the foods in level 2 (soft diet) are passing through well you should advance up to more chopped foods.  °-It is still important to cut these foods into small pieces and eat slowly. ° °Hot Foods Cold Foods  °Poultry Cottage cheese  °Chopped Swedish meatballs Yogurt  °Meat salads (ground or flaked meat) Milk  °Flaked fish (tuna) Milkshakes  °Poached or scrambled eggs Soft, cold, dry cereal  °Souffles and omelets Fruit juices or nectars  °Cooked cereals Chopped canned fruit  °Chopped French toast or pancakes Canned fruit cocktail  °Noodles or pasta (no rice) Pudding, mousse, custard  °Cooked vegetables (no frozen peas, corn, or mixed vegetables) Green salad  °Canned small sweet peas Ice cream  °Creamed soup or vegetable soup Fruit ice, Italian ice  °Pureed vegetable soup or alphabet soup Non-dairy creamer  °Ground scalloped apples Margarine  °Gravies Mayonnaise  °Sauces:  Cheese, creamed, barbecue, tomato, white Ketchup  °Coffee or tea Mustard   ° °SAMPLE MENU:  LEVEL 3 °Breakfast Lunch Dinner  °· Orange juice, 1/2 cup °· Oatmeal, 1/2 cup °· Scrambled eggs with cheese, 1/2 cup °· Decaffeinated tea, 1 cup °· Whole milk, 1 cup °· Non-dairy creamer, 2 Tbsp °· Ketchup, 1 Tbsp °· Margarine, 1 tsp °· Salt, 1/4 tsp °· Sugar, 2 tsp · Pineapple juice, 1/2 cup °· Ground beef, 3 oz °· Gravy, 2 Tbsp °· Mashed potatoes, 1/2 cup °· Cooked spinach, 1/2 cup °· Applesauce, 1/2 cup °· Decaffeinated coffee °· Whole milk °· Non-dairy creamer, 2 Tbsp °· Margarine, 1 tsp °· Salt, 1/4 tsp · Pureed turkey, barley soup, 3/4 cup °· Barbecue chicken, 3 oz °· Mashed potatoes, 1/2 cup °· Ground fresh broccoli, 1/2 cup °· Frozen yogurt, 1/2 cup °· Decaffeinated tea, 1 cup °· Non-dairy creamer, 2 Tbsp °· Margarine, 1 tsp °· Salt, 1/4 tsp °· Sugar, 1 tsp  ° ° °LEVEL 4:  REGULAR FOODS ° °-Foods in this group are soft, moist, regularly textured foods.   °-This level includes meat and breads, which tend to be the hardest things to swallow.   °-Eat very slowly, chew well and continue to avoid carbonated drinks. °-most people are at this level in 4-6 weeks ° °Hot Foods Cold Foods  °Baked fish or skinned Soft cheeses - cottage cheese  °Souffles and omelets Cream cheese  °Eggs Yogurt  °Stuffed shells Milk  °Spaghetti with meat sauce Milkshakes  °Cooked cereal Cold dry cereals (no nuts, dried fruit, coconut)  °French toast or pancakes Crackers  °Buttered toast Fruit juices or nectars  °Noodles or pasta (no rice) Canned fruit  °Potatoes (all types) Ripe bananas  °Soft, cooked vegetables (no corn, lima, or baked beans) Peeled, ripe, fresh fruit  °Creamed soups or vegetable soup Cakes (no nuts, dried fruit, coconut)  °Canned chicken noodle soup Plain doughnuts  °Gravies Ice cream  °Bacon dressing Pudding, mousse, custard  °Sauces:  Cheese, creamed, barbecue, tomato, white Fruit ice, Italian ice, sherbet  °Decaffeinated tea or coffee Whipped gelatin  °Pork chops Regular gelatin  ° Canned fruited  gelatin molds  ° Sugar, syrup, honey, jam, jelly  ° Cream  ° Non-dairy  ° Margarine  ° Oil  ° Mayonnaise  ° Ketchup  ° Mustard  ° °TROUBLESHOOTING IRREGULAR BOWELS  °1) Avoid extremes of bowel   movements (no bad constipation/diarrhea)  °2) Miralax 17gm mixed in 8oz. water or juice-daily. May use BID as needed.  °3) Gas-x,Phazyme, etc. as needed for gas & bloating.  °4) Soft,bland diet. No spicy,greasy,fried foods.  °5) Prilosec over-the-counter as needed  °6) May hold gluten/wheat products from diet to see if symptoms improve.  °7) May try probiotics (Align, Activa, etc) to help calm the bowels down  °7) If symptoms become worse call back immediately. ° ° ° °If you have any questions please call our office at [facility]: [phone]. ° °LAPAROSCOPIC SURGERY: POST OP INSTRUCTIONS ° °###################################################################### ° °EAT °Gradually transition to a high fiber diet with a fiber supplement over the next few weeks after discharge.  Start with a pureed / full liquid diet (see below) ° °WALK °Walk an hour a day.  Control your pain to do that.   ° °CONTROL PAIN °Control pain so that you can walk, sleep, tolerate sneezing/coughing, go up/down stairs. ° °HAVE A BOWEL MOVEMENT DAILY °Keep your bowels regular to avoid problems.  OK to try a laxative to override constipation.  OK to use an antidairrheal to slow down diarrhea.  Call if not better after 2 tries ° °CALL IF YOU HAVE PROBLEMS/CONCERNS °Call if you are still struggling despite following these instructions. °Call if you have concerns not answered by these instructions ° °###################################################################### ° ° ° °1. DIET: Follow a light bland diet the first 24 hours after arrival home, such as soup, liquids, crackers, etc.  Be sure to include lots of fluids daily.  Avoid fast food or heavy meals as your are more likely to get nauseated.  Eat a low fat the next few days after  surgery.   °2. Take your usually prescribed home medications unless otherwise directed. °3. PAIN CONTROL: °a. Pain is best controlled by a usual combination of three different methods TOGETHER: °i. Ice/Heat °ii. Over the counter pain medication °iii. Prescription pain medication °b. Most patients will experience some swelling and bruising around the incisions.  Ice packs or heating pads (30-60 minutes up to 6 times a day) will help. Use ice for the first few days to help decrease swelling and bruising, then switch to heat to help relax tight/sore spots and speed recovery.  Some people prefer to use ice alone, heat alone, alternating between ice & heat.  Experiment to what works for you.  Swelling and bruising can take several weeks to resolve.   °c. It is helpful to take an over-the-counter pain medication regularly for the first few weeks.  Choose one of the following that works best for you: °i. Naproxen (Aleve, etc)  Two 220mg tabs twice a day °ii. Ibuprofen (Advil, etc) Three 200mg tabs four times a day (every meal & bedtime) °iii. Acetaminophen (Tylenol, etc) 500-650mg four times a day (every meal & bedtime) °d. A  prescription for pain medication (such as oxycodone, hydrocodone, etc) should be given to you upon discharge.  Take your pain medication as prescribed.  °i. If you are having problems/concerns with the prescription medicine (does not control pain, nausea, vomiting, rash, itching, etc), please call us [phone] to see if we need to switch you to a different pain medicine that will work better for you and/or control your side effect better. °ii. If you need a refill on your pain medication, please contact your pharmacy.  They will contact our office to request authorization. Prescriptions will not be filled after 5 pm or on week-ends. °4. Avoid getting   constipated.  Between the surgery and the pain medications, it is common to experience some constipation.  Increasing fluid intake and taking a  fiber supplement (such as Metamucil, Citrucel, FiberCon, MiraLax, etc) 1-2 times a day regularly will usually help prevent this problem from occurring.  A mild laxative (prune juice, Milk of Magnesia, MiraLax, etc) should be taken according to package directions if there are no bowel movements after 48 hours.   °5. Watch out for diarrhea.  If you have many loose bowel movements, simplify your diet to bland foods & liquids for a few days.  Stop any stool softeners and decrease your fiber supplement.  Switching to mild anti-diarrheal medications (Kayopectate, Pepto Bismol) can help.  If this worsens or does not improve, please call us. °6. Wash / shower every day.  You may shower over the dressings as they are waterproof.  Continue to shower over incision(s) after the dressing is off. °7. Remove your waterproof bandages 5 days after surgery.  You may leave the incision open to air.  You may replace a dressing/Band-Aid to cover the incision for comfort if you wish.  °8. ACTIVITIES as tolerated:   °a. You may resume regular (light) daily activities beginning the next day--such as daily self-care, walking, climbing stairs--gradually increasing activities as tolerated.  If you can walk 30 minutes without difficulty, it is safe to try more intense activity such as jogging, treadmill, bicycling, low-impact aerobics, swimming, etc. °b. Save the most intensive and strenuous activity for last such as sit-ups, heavy lifting, contact sports, etc  Refrain from any heavy lifting or straining until you are off narcotics for pain control.   °c. DO NOT PUSH THROUGH PAIN.  Let pain be your guide: If it hurts to do something, don't do it.  Pain is your body warning you to avoid that activity for another week until the pain goes down. °d. You may drive when you are no longer taking prescription pain medication, you can comfortably wear a seatbelt, and you can safely maneuver your car and apply brakes. °e. You may have sexual intercourse  when it is comfortable.  °9. FOLLOW UP in our office °a. Please call [facility] at [phone] to set up an appointment to see your surgeon in the office for a follow-up appointment approximately 2-3 weeks after your surgery. °b. Make sure that you call for this appointment the day you arrive home to insure a convenient appointment time. °10. IF YOU HAVE DISABILITY OR FAMILY LEAVE FORMS, BRING THEM TO THE OFFICE FOR PROCESSING.  DO NOT GIVE THEM TO YOUR DOCTOR. ° ° °WHEN TO CALL US [phone]: °1. Poor pain control °2. Reactions / problems with new medications (rash/itching, nausea, etc)  °3. Fever over 101.5 F (38.5 C) °4. Inability to urinate °5. Nausea and/or vomiting °6. Worsening swelling or bruising °7. Continued bleeding from incision. °8. Increased pain, redness, or drainage from the incision ° ° The clinic staff is available to answer your questions during regular business hours (8:30am-5pm).  Please don’t hesitate to call and ask to speak to one of our nurses for clinical concerns.  ° If you have a medical emergency, go to the nearest emergency room or call 911. ° A surgeon from [facility] is always on call at the hospitals ° ° °[facility] °[address], [address], [place], [place]  [address] ? °MAIN: [phone] ? TOLL FREE: [phone] ?  °FAX [phone] °[email or web address] ° °DRAIN CARE:   °  You have a closed bulb drain to help you heal. ° ° ° °A bulb drain is a small, plastic reservoir which creates a gentle suction. It is used to remove excess fluid from a surgical wound. The color and amount of fluid will vary. Immediately after surgery, the fluid is bright red. It may gradually change to a yellow color. When the amount decreases to about 1 or 2 tablespoons (15 to 30 cc) per 24 hours, your caregiver will usually remove it. ° °JP Care ° °The Jackson-Pratt drainage system has flexible tubing attached to a soft, plastic bulb with a stopper. The  drainage end of the tubing, which is flat and white, goes into your body through a small opening near your incision (surgical cut). A stitch holds the drainage end in place. The rest of the tube is outside your body, attached to the bulb. When the bulb is compressed with the stopper in place, it creates a vacuum. This causes a constant gentle suction, which helps draw out fluid that collects under your incision. The bulb should be compressed at all times, except when you are emptying the drainage. ° °How long you will have your Jackson-Pratt depends on your surgery and the amount of fluid is draining. This is different for everyone. The Jackson-Pratt is usually removed when the drainage is 30 mL or less over 24 hours. To keep track of how much drainage you’re having, you will record the amount in a drainage log. It’s important to bring the log with you to your follow-up appointments. ° °Caring for Your Jackson-Pratt at Home °In order to care for your Jackson-Pratt at home, you or your caregiver will do the following: ° °Empty the drain once a day and record the color and amount of drainage  °Care for the area where the tubing enters your skin by washing with soap and water. ° °Milk the tubing to help move clots into the bulb.  Do this before you empty and measure your drainage. °Look in the mirror at the tubing. This will help you see where your hands need to be. °Pinch the tubing close to where it goes into your skin between your thumb and forefinger. °With the thumb and forefinger of your other hand, pinch the tubing right below your other fingers. Keep your fingers pinched and slide them down the tubing, pushing any clots down toward the bulb. You may want to use alcohol swabs to help you slide your fingers down the tubing. °Repeat steps 3 and 4 as necessary to push clots from the tubing into the bulb. If you are not able to move a clot into the bulb, call your doctor’s office. °The fluid may leak around the  insertion site if a clot is blocking the drainage flow. If there is fluid in the bulb and no leakage at the insertion site, the drain is working. ° °How to Empty Your Jackson-Pratt and Record the Drainage °You will need to empty your Jackson-Pratt every day ° °Gather the following supplies: ° °Measuring container your nurse gave you °Jackson-Pratt Drainage Record  °Pen or pencil ° °Instructions °Clean an area to work on. °Clean your hands thoroughly. °Unplug the stopper on top of your Jackson-Pratt. This will cause the bulb to expand. Do not touch the inside of the stopper or the inner area of the opening on the bulb. °Turn your Jackson-Pratt upside down, gently squeeze the bulb, and pour the drainage into the measuring container. °Turn your Jackson-Pratt right side up. °Squeeze   the bulb until your fingers feel the palm of your hand. °Keep squeezing the bulb while you replug the stopper. °Make sure the bulb stays fully compressed to ensure constant, gentle suction. ° ° ° °Check the amount and color of drainage in the measuring container. The first couple days after surgery the fluid may be dark red. This is normal. As you heal the fluid may look pink or pale yellow. °Record this amount and the color of drainage on your Jackson-Pratt Drainage Record. °Flush the drainage down the toilet and rinse the measuring container with water. ° °Caring for the Insertion Site ° °Once you have emptied the drainage, clean your hands again. Check the area around the insertion site. Look for tenderness, swelling, or pus. If you have any of these, or if you have a temperature of 101° F (38.3° C) or higher, you may have an infection. Call your doctor’s office. ° °Sometimes, the drain causes redness the size of a dime at your insertion site. This is normal. Your healthcare provider will tell you if you should place a bandage over the insertion site. ° °Wash drain site with soap & water (dilute hydrogen peroxide PRN) daily & replace  clean dressing / tape ° ° ° °DAILY CARE °· Keep the bulb compressed at all times, except while emptying it. The compression creates suction.  °· Keep sites where the tubes enter the skin dry and covered with a light bandage (dressing).  °· Tape the tubes to your skin, 1 to 2 inches below the insertion sites, to keep from pulling on your stitches. Tubes are stitched in place and will not slip out.  °· Pin the bulb to your shirt (not to your pants) with a safety pin.  °· For the first few days after surgery, there usually is more fluid in the bulb. Empty the bulb whenever it becomes half full because the bulb does not create enough suction if it is too full. Include this amount in your 24 hour totals.  °· When the amount of drainage decreases, empty the bulb at the same time every day. Write down the amounts and the 24 hour totals. Your caregiver will want to know them. This helps your caregiver know when the tubes can be removed.  °· (We anticipate removing the drain in 1-3 weeks, depending on when the output is <30mL a day for 2+ days) °· If there is drainage around the tube sites, change dressings and keep the area dry. If you see a clot in the tube, leave it alone. However, if the tube does not appear to be draining, let your caregiver know.  °TO EMPTY THE BULB °· Open the stopper to release suction.  °· Holding the stopper out of the way, pour drainage into the measuring cup that was sent home with you.  °· Measure and write down the amount. If there are 2 bulbs, note the amount of drainage from bulb 1 or bulb 2 and keep the totals separate. Your caregiver will want to know which tube is draining more.  °· Compress the bulb by folding it in half.  °· Replace the stopper.  °· Check the tape that holds the tube to your skin, and pin the bulb to your shirt.  °SEEK MEDICAL CARE IF: °· The drainage develops a bad odor.  °· You have an oral temperature above 102° F (38.9° C).  °· The amount of drainage from your wound  suddenly increases or decreases.  °· You accidentally   pull out your drain.  °· You have any other questions or concerns.  °MAKE SURE YOU:  °· Understand these instructions.  °· Will watch your condition.  °· Will get help right away if you are not doing well or get worse.  ° ° °· Call our office if you have any questions about your drain. [phone] ° ° ° °

## 2017-01-31 NOTE — Interval H&P Note (Signed)
History and Physical Interval Note:  01/31/2017 8:15 AM  Sydney [name]  has presented today for surgery, with the diagnosis of PARAESOPHAGEAL HIATAL HERNIA WITH ORGANOAXIAL VOLVULUS, REFRACTORY TO MEDICAL MANAGEMENT   The various methods of treatment have been discussed with the patient and family. After consideration of risks, benefits and other options for treatment, the patient has consented to  Procedure(s): LAPAROSCOPIC VERSUS ROBOTIC MINIMALLY INVASIVE REPAIR OF PARAESOPHAGEAL HIATAL HERNIA WITH FUNDOPLICATION (N/A) as a surgical intervention .  The patient's history has been reviewed, patient examined, no change in status, stable for surgery.  I have reviewed the patient's chart and labs.  Questions were answered to the patient's satisfaction.    I have re-reviewed the the patient's records, history, medications, and allergies.  I have re-examined the patient.  I again discussed intraoperative plans and goals of post-operative recovery.  The patient agrees to proceed.  CRISTIAN GRIEVES  1945/07/04 [id number]  Patient Care Team: Selinda Flavin, MD as PCP - General (Family Medicine) Karie Soda, MD as Consulting Physician (General Surgery) Danis, Andreas Blower, MD as Consulting Physician (Gastroenterology)  Patient Active Problem List   Diagnosis Date Noted  . Paraesophageal hiatal hernia 01/31/2017  . 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 Obesity   . Hypertension   . Gastroesophageal reflux disease   . Hiatal hernia     Past Medical History:  Diagnosis Date  . Arthritis   . Hiatal hernia with obstruction but no gangrene   . Hypertension   . Obesity   . Seasonal allergies   . Thyroid nodule   . Vertigo     Past Surgical History:  Procedure Laterality Date  . CATARACT EXTRACTION, BILATERAL  2016  . CHOLECYSTECTOMY    . ESOPHAGEAL MANOMETRY N/A 01/03/2017   Procedure: ESOPHAGEAL MANOMETRY (EM);  Surgeon: Napoleon Form, MD;  Location: [facility];  Service:  Endoscopy;  Laterality: N/A;  . MOUTH SURGERY    . TUBAL LIGATION      Social History   Socioeconomic History  . Marital status: Married    Spouse name: Not on file  . Number of children: 2  . Years of education: Not on file  . Highest education level: Not on file  Social Needs  . Financial resource strain: Not on file  . Food insecurity - worry: Not on file  . Food insecurity - inability: Not on file  . Transportation needs - medical: Not on file  . Transportation needs - non-medical: Not on file  Occupational History  . Occupation: retired  Tobacco Use  . Smoking status: Never Smoker  . Smokeless tobacco: Never Used  Substance and Sexual Activity  . Alcohol use: No  . Drug use: No  . Sexual activity: Not on file  Other Topics Concern  . Not on file  Social History Narrative   4 grandsons, 5 great grands    Family History  Problem Relation Age of Onset  . Hypertension Mother   . Macular degeneration Mother        legally blind  . Heart Problems Mother        skips a beat  . Heart attack Father   . Leukemia Father   . Colon cancer Neg Hx   . Rectal cancer Neg Hx   . Throat cancer Neg Hx   . Esophageal cancer Neg Hx   . Stomach cancer Neg Hx     Facility-Administered Medications Prior to Admission  Medication Dose Route Frequency Provider Last  Rate Last Dose  . 0.9 %  sodium chloride infusion  500 mL Intravenous Continuous Charlie Pitter III, MD       Medications Prior to Admission  Medication Sig Dispense Refill Last Dose  . acetaminophen (TYLENOL) 500 MG tablet Take 1,000 mg every 6 (six) hours as needed by mouth (for pain.).   Past Week at Unknown time  . chlorthalidone (HYGROTON) 25 MG tablet Take 25 mg daily as needed by mouth (for fluid retention/swelling.).    01/30/2017 at Unknown time  . ranitidine (ZANTAC) 300 MG tablet Take 300 mg by mouth 2 (two) times daily.   01/30/2017 at Unknown time    Current Facility-Administered Medications  Medication  Dose Route Frequency Provider Last Rate Last Dose  . dextrose 5 % with cefTRIAXone (ROCEPHIN) ADS Med           . dextrose 5 % with cefTRIAXone (ROCEPHIN) ADS Med           . bupivacaine liposome (EXPAREL) 1.3 % injection 266 mg  20 mL Infiltration Once Karie Soda, MD      . cefTRIAXone (ROCEPHIN) 2 g in dextrose 5 % 50 mL IVPB  2 g Intravenous On Call to OR Karie Soda, MD       And  . metroNIDAZOLE (FLAGYL) IVPB 500 mg  500 mg Intravenous On Call to OR Karie Soda, MD      . dexamethasone (DECADRON) injection 4 mg  4 mg Intravenous On Call to OR Karie Soda, MD      . feeding supplement (ENSURE SURGERY) liquid 237 mL  237 mL Oral BID BM Karie Soda, MD      . scopolamine (TRANSDERM-SCOP) 1 MG/3DAYS 1.5 mg  1 patch Transdermal On Call to OR Karie Soda, MD   1.5 mg at 01/31/17 0710     No Known Allergies  Ht 5' (1.524 m)   Wt 72.1 kg (159 lb)   BMI 31.05 kg/m   Labs: No results found for this or any previous visit (from the past 48 hour(s)).  Imaging / Studies: No results found.   Sydney [name], M.D., F.A.C.S. Gastrointestinal and Minimally Invasive Surgery [facility] [address] [place], [place] [address] [phone] Main / Paging  01/31/2017 8:15 AM    Sydney [name]

## 2017-01-31 NOTE — Op Note (Signed)
01/31/2017  12:36 PM  PATIENT:  Sydney [name]  71 y.o. female  Patient Care Team: Selinda Flavin, MD as PCP - General (Family Medicine) Karie Soda, MD as Consulting Physician (General Surgery) Danis, Andreas Blower, MD as Consulting Physician (Gastroenterology)  PRE-OPERATIVE DIAGNOSIS:  PARAESOPHAGEAL HIATAL HERNIA WITH ORGANOAXIAL VOLVULUS, REFRACTORY TO MEDICAL MANAGEMENT   POST-OPERATIVE DIAGNOSIS:  PARAESOPHAGEAL HIATAL HERNIA WITH ORGANOAXIAL VOLVULUS, REFRACTORY TO MEDICAL MANAGEMENT   PROCEDURE:    1. Robotic Laparoscopic reduction of paraesophageal hiatal hernia 2. Type II mediastinal dissection. 3. Primary repair of hiatal hernia over pledgets. 3.5 Mesh reinforcement (Phasix) of hiatal hernia repair 4. Anterior & posterior gastropexy. 5. Nissen fundoplication 2 cm over a 56-French bougie  SURGEON:  [name], MD  ASSIST:  Marin Olp, MD  ANESTHESIA:   local and general  EBL:  Total I/O In: 1000 [I.V.:1000] Out: 50 [Blood:50]  Delay start of Pharmacological VTE agent (>24hrs) due to surgical blood loss or risk of bleeding:  no  ANESTHESIA: 1. General anesthesia. 2. Local anesthetic in a field block around all port sites.  SPECIMEN:  Mediastinal hernia sac (not sent).  DRAINS:  A 19-French Blake drain goes from the right upper quadrant along the lesser curvature of the stomach into the mediastinum.  COUNTS:  YES  PLAN OF CARE: Admit to inpatient   PATIENT DISPOSITION:  PACU - hemodynamically stable.  INDICATION:   Patient with symptomatic paraesophageal hiatal hernia.  Organoaxial volvulus rotation.  No delayed gastric emptying.  Manometry with no atypical peristalsis.  The patient has had extensive work-up & we feel the patient will benefit from repair:  The anatomy & physiology of the foregut and anti-reflux mechanism was discussed.  The pathophysiology of hiatal herniation and GERD was discussed.  Natural history risks without surgery was  discussed.   The patient's symptoms are not adequately controlled by medicines and other non-operative treatments.  I feel the risks of no intervention will lead to serious problems that outweigh the operative risks; therefore, I recommended surgery to reduce the hiatal hernia out of the chest and fundoplication to rebuild the anti-reflux valve and control reflux better.  Need for a thorough workup to rule out the differential diagnosis and plan treatment was explained.  I explained laparoscopic techniques with possible need for an open approach.  Risks such as bleeding, infection, abscess, leak, need for further treatment, heart attack, death, and other risks were discussed.   I noted a good likelihood this will help address the problem.  Goals of post-operative recovery were discussed as well.  Possibility that this will not correct all symptoms was explained.  Post-operative dysphagia, need for short-term liquid & pureed diet, inability to vomit, possibility of reherniation, possible need for medicines to help control symptoms in addition to surgery were discussed.  We will work to minimize complications.   Educational handouts further explaining the pathology, treatment options, and dysphagia diet was given as well.  Questions were answered.  The patient expresses understanding & wishes to proceed with surgery.  OR FINDINGS:   Moderate-sized paraesophageal hiatal hernia with 100% of the stomach and duodenal bulb in the mediastinum.  There was a 10 x 10 cm hiatal defect.  It is a primary repair over pledgets.   Mesh was used to reinforce the primary repair over pledgets.  15 x 10 cm PhasixT Mesh (a knitted monofilament mesh scaffold using Poly-4-hydroxybutyrate (P4HB), a biologically derived, fully resorbable material)  The patient has a 2 cm Nissen fundoplication  that was done over 56-French bougie.  The patient has had anterior and posterior gastropexies.  DESCRIPTION:   Informed consent was  confirmed.  The patient received IV antibiotics prior to incision.  The underwent general anesthesia without difficulty.  A Foley catheter sterilely placed.  The patient was positioned in split leg with arms tucked. The abdomen was prepped and draped in the  sterile fashion.  Surgical time-out confirmed our plan.  I induced capnoperitoneum using a Varess technique.  Placed to a millimeter port in the anterior axillary line in the left subcostal region   Camera inspection revealed no injury.  Under direct visualization, I placed additional ports in the abdomen including assist port in the right lower quadrant.  I also placed a 5 mm port in the left subxiphoid region under direct visualization.  I removed that and placed an Omega-shaped rigid Nathanson liver retractor to lift the left lateral sector of the liver anteriorly to expose the esophageal hiatus.  Fortunately it was relatively small.  This was secured to the bed using the iron man system.  The Xi robot was carefully docked and positioned.  I confirmed a very large hiatal hernia with all the stomach and the duodenal bulb incarcerated up in the mediastinum.  It would not spontaneously reduced down.  Therefore decided to dissect the hernia sac off the mediastinum to help reduce the contents.  I started in the right anterior crus to grasp the anterior mediastinal sac at the apex of the crus.  I scored through that and got into the anterior mediastinum.  I was able to free the mediastinal sac from its attachments to the pericardium and bilateral pleura using primarily focused gentle blunt dissection as well as focused ultrasonic Harmonic dissection.  I transected phrenoesophageal attachments to the inner right crus, preserving a two centimeter cuff of mediastinal sac until I found the base of the crura.  I then came around anteriorly on the left side and freed up the phrenoesophageal attachments of the mediastinal sac on the medial part of the left crus on  the superior half.  I did careful mediastinal dissection to free the mediastinal sac.  It was quite large especially in the right mediastinum.  With that, we could relieve the suction cup affect of the hernia sac and help reduce the stomach back down into the abdomen, flipped back approriately.    We ligated the short gastrics along the lesser curvature of the stomach about a third way down and then came up proximally over the fundus.  We released the attachments of the stomach to the retroperitoneum until we were able to connect with the prior dissection on the left crus.  We completed the release of phrenoesophageal attachments to the medial part of the left crus down to its base.  With this, we had circumferential mobilization.  I had to do further dissection to ultimately free off adhesions of omentum and thick hernia sac at the base of both crura.  I did release the liver attachments to the anterior diaphragm and right crus to help release some tension.  Also did a relaxing incision on the left diaphragm parallel to the left crus.  This helped the hiatus seem to come together which much less tension.  I did have breech of the thinned out left diaphragm into the left pleural cavity with a clean opening.  This was later closed with a horizontal mattress suture & patched / covered by the mesh (see below).  We placed  the stomach and esophagus on axial tension.  I then did a Type II mediastinal dissection where I freed the esophagus from its attachments to the aorta, spine, pleura, and pericardium using primarily gentle blunt as well as focused bipolar vessel sealer dissection.  We saw the anterior & posterior vagus nerves worked to keep them intact.  I procedded to dissect about 25 cm proximally into the mediastinum.  Came up close to the azygos vein definitely in the mid mediastinum.  With that eventually I could free out the esophagus and get much more length.  With that I could straighten out the esophagus  and get 4 cm of intra-abdominal length of the esophagus at a best estimation.  I freed the anterior mediastinal sac off the esophagus & stomach.  Carefully skeletonized.  We saw the anterior vagus nerve and freed the sac off of the vagus.  Greater omentum was rather adherent to the posterior hernia sac, so this took some time to carefully free off.  I dissected out & removed the fatty epiphrenic pads at the esophagogastric junction. With that, I could better define the esophagogastric junction.  I confirmed the the patient had 4 cm of intra-abdominal esophageal length off tension.  After inspection the anterior vagus nerves was then brought intact.  Posterior vagus was more wispy in the mid mediastinum but again intact.  I brought the fundus of the stomach posterior to the esophagus over to the right side.  The wrap was mobile with the classic shoeshine maneuver.  Wrap became together gently.  We reflected the stomach left laterally and closed the esophageal hiatus using 0 Ethibond stitch using horizontal mattress stitches with pledgets on both sides.  I did that x3 stitches.  The crura had good substance and they came together well without any tension.  He also did a reinforcement mesh repair using Phasix mesh.  15 x 10 cm sheet.  Cut a conical defect to form a broad U.  5 cm right, 10 cm left side.  I rolled that mesh in.  I positioned it on top of the crural closure.  The more narrow tail overlying the right crura.  The broader tail between the diaphragm and spleen.  Cured that to the diaphragm using interrupted Ethibond sutures.  I closed the small left diaphragmatic releasing incision defect with a 0 Ethibond horizontal mattress suture and out the mesh to help reinforce the closure.  I made sure that there was a gap anteriorly to avoid circumferential tightening or stricturing.  I reset up the and Nissen wrap and did a posterior gastropexy by taking of 0 Ethibond stitches x 3 to the posterior part of  the right side of the wrap and thru the crural closure and tied that down for good posterior gastropexy up against the hiatal closure.  I then did a left anterior gastropexy taking the apex of the left side of the wrap to the left anterior crura just posterior to the phrenic vein.  In a similar suture in a mirror image fashion on the right side for the right posterior part of the stomach reactive to the anterior right corona.  I left the stitches untied.   Next, Anesthesia passed a 56-French bougie transorally.  It was a lighted bougie and we did do this under axial tension.  It passed down easily without resistance.  I tied the anterior gastropexy sutures down.  Because she had normal esophageal motility by manometry, I decided to have a good antireflux valve  reconstruction with a classic 2 cm Nissen fundoplication on the true esophagus above the cardia using Ethibond stitch in the left side of the wrap, then anterior esophagus, and then right side of the wrap and tied that down. Did 3 stitches.  I measured it and it was 2 cm in length.  We removed the bougie.  It was intact.    The wrap was soft and floppy.  I placed a drain as noted above.  I did irrigation and ensured hemostasis.  I saw no evidence of any leak or perforation or other abnormality.  I removed the [facility] liver retractor under direct visualization.  I evacuated carbon dioxide and removed the ports.  Counts were correct on the needles, sponges, and instruments.  The skin was closed with Monocryl and sterile dressings applied.  The largest port was along the left subcostal region and slid up above the rib cage, so I did not feel it needed more aggressive closure.  The patient is being extubated and brought back to the recovery room.  I discussed postop care in detail with the patient and family in in the office.  Discussed again with the patient in the holding area.  I discussed operative findings, updated the patient's status, discussed  probable steps to recovery, and gave postoperative recommendations to the patient's family.  Recommendations were made.  Questions were answered.  They expressed understanding & appreciation.  [name], M.D., F.A.C.S. Gastrointestinal and Minimally Invasive Surgery [facility] [address], [address] [place], [place] [address] [phone] Main / Paging

## 2017-02-01 ENCOUNTER — Inpatient Hospital Stay (HOSPITAL_COMMUNITY): Payer: Medicare Other

## 2017-02-01 MED ORDER — IOPAMIDOL (ISOVUE-300) INJECTION 61%
INTRAVENOUS | Status: AC
  Administered 2017-02-01: 50 mL via ORAL
  Filled 2017-02-01: qty 50

## 2017-02-01 NOTE — Plan of Care (Deleted)
  Clinical Measurements: Will remain free from infection 02/01/2017 0331 - Progressing by Viann Shove, RN   Clinical Measurements: Respiratory complications will improve 02/01/2017 0331 - Progressing by Viann Shove, RN   Clinical Measurements: Cardiovascular complication will be avoided 02/01/2017 0331 - Progressing by Viann Shove, RN   Activity: Risk for activity intolerance will decrease 02/01/2017 0331 - Progressing by Viann Shove, RN

## 2017-02-01 NOTE — Progress Notes (Signed)
  General Surgery [facility]  Assessment & Plan: POD#1 - status post robotic hiatal hernia repair with Nissen fundoplication  Awaiting esophagogram this AM - will begin diet pending results  Monitor drain output - drain to remain in place at discharge  OOB, ambulate - has been up this AM  Pain Rx, K-pad        [name], MD, [facility]       Office: [phone]    Chief Complaint: Hiatal hernia  Subjective: Patient in bed, K-pad on chest.  Moderate pain.  Husband at bedside.  Has been up to ambulate, bathroom.  Objective: Vital signs in last 24 hours: Temp:  [97.6 F (36.4 C)-98.9 F (37.2 C)] 98.9 F (37.2 C) (11/22 0514) Pulse Rate:  [76-95] 76 (11/22 0514) Resp:  [13-18] 18 (11/22 0514) BP: (112-140)/(53-77) 117/53 (11/22 0514) SpO2:  [95 %-100 %] 97 % (11/22 0514)    Intake/Output from previous day: 11/21 0701 - 11/22 0700 In: 2549.2 [I.V.:2549.2] Out: 798 [Drains:348; Stool:400; Blood:50] Intake/Output this shift: No intake/output data recorded.  Physical Exam: HEENT - sclerae clear, mucous membranes moist Neck - soft Chest - clear bilaterally Cor - RRR Abdomen - soft, dressings dry and intact; JP drain with sanguinous output, moderate Ext - no edema, non-tender Neuro - alert & oriented, no focal deficits  Lab Results:  Recent Labs    01/31/17 0759  HGB 14.6  HCT 43.0   BMET Recent Labs    01/31/17 0759  NA 141  K 3.2*  GLUCOSE 100*   PT/INR No results for input(s): LABPROT, INR in the last 72 hours. Comprehensive Metabolic Panel:    Component Value Date/Time   NA 141 01/31/2017 0759   NA 139 01/26/2017 0933   K 3.2 (L) 01/31/2017 0759   K 2.9 (L) 01/26/2017 0933   CL 98 (L) 01/26/2017 0933   CO2 31 01/26/2017 0933   BUN 15 01/26/2017 0933   CREATININE 0.70 01/26/2017 0933   GLUCOSE 100 (H) 01/31/2017 0759   GLUCOSE 100 (H) 01/26/2017 0933   CALCIUM 9.7 01/26/2017 0933     Studies/Results: No results found.    Sydney [name] M 02/01/2017  Patient ID: Sydney [name], female   DOB: 1946-01-15, 71 y.o.   MRN: [id number]

## 2017-02-02 MED ORDER — BISACODYL 10 MG RE SUPP
10.0000 mg | RECTAL | Status: AC
  Administered 2017-02-02: 10 mg via RECTAL
  Filled 2017-02-02: qty 1

## 2017-02-02 NOTE — Progress Notes (Signed)
General Surgery [facility]  Assessment & Plan: POD#2 - status post robotic hiatal hernia repair with Nissen fundoplication             Esophagogram without leak             Monitor drain output - drain to remain in place at discharge             OOB, ambulate in halls             Advance to pureed diet this AM               Dulcolax supp for constipation this AM              Encouraged IS use every hour while awake                   Anticipate discharge home tomorrow         [name], MD, [facility]       Office: [phone]    Chief Complaint: Hiatal hernia  Subjective: Patient in bed, husband at bedside.  Pain improving.  Complains of constipation.  Limited ambulation.  Poor IS performance.  Objective: Vital signs in last 24 hours: Temp:  [98.5 F (36.9 C)-99.6 F (37.6 C)] 98.9 F (37.2 C) (11/23 0559) Pulse Rate:  [76-96] 85 (11/23 0559) Resp:  [18] 18 (11/23 0559) BP: (124-125)/(51-60) 124/51 (11/23 0559) SpO2:  [94 %-98 %] 95 % (11/23 0559) Last BM Date: 01/29/17  Intake/Output from previous day: 11/22 0701 - 11/23 0700 In: 833 [P.O.:780; I.V.:53] Out: 580 [Urine:500; Drains:80] Intake/Output this shift: Total I/O In: -  Out: 225 [Urine:225]  Physical Exam: HEENT - sclerae clear, mucous membranes moist Neck - soft Abdomen - soft, dressings dry and intact; JP with serosanguinous, decreased Ext - no edema, non-tender Neuro - alert & oriented, no focal deficits  Lab Results:  Recent Labs    01/31/17 0759  HGB 14.6  HCT 43.0   BMET Recent Labs    01/31/17 0759  NA 141  K 3.2*  GLUCOSE 100*   PT/INR No results for input(s): LABPROT, INR in the last 72 hours. Comprehensive Metabolic Panel:    Component Value Date/Time   NA 141 01/31/2017 0759   NA 139 01/26/2017 0933   K 3.2 (L) 01/31/2017 0759   K 2.9 (L) 01/26/2017 0933   CL 98 (L) 01/26/2017 0933   CO2 31 01/26/2017 0933   BUN 15 01/26/2017 0933   CREATININE 0.70 01/26/2017 0933   GLUCOSE 100 (H) 01/31/2017 0759   GLUCOSE 100 (H) 01/26/2017 0933   CALCIUM 9.7 01/26/2017 0933    Studies/Results: Dg Esophagus W/water Sol Cm  Result Date: 02/01/2017 CLINICAL DATA:  71 year old female status post robotic assisted hiatal hernia repair with Nissen fundoplication. Post- operative assessment. EXAM: ESOPHOGRAM/BARIUM SWALLOW TECHNIQUE: Single contrast examination was performed using water-soluble contrast. FLUOROSCOPY TIME:  Fluoroscopy Time:  1 minutes Radiation Exposure Index (if provided by the fluoroscopic device): 35.5 mGy COMPARISON:  None. FINDINGS: No evidence of extravasation of swallowed contrast material to suggest leak. The gastroesophageal junction is patent. In the region of the Nissen fundoplication, there is a rounded soft tissue density surrounding a relatively narrowed channel of barium contrast likely representing postoperative edema. The channel measures between 2 and 5 mm. Minimal retention of oral contrast within the distal esophagus which clears following dry swallows. Tertiary contractions are present  within the distal esophageal wall. The patient remained asymptomatic during the procedure. IMPRESSION: 1. No evidence of postoperative leak. 2. Suspect postoperative soft tissue swelling in the region of the Nissen fundoplication resulting in relative narrowing of the gastroesophageal junction. The minimal diameter measures between 2 and 5 mm. 3. Mild retention of swallowed contrast material in the distal esophagus which clears on repeated dry swallows. The patient remained asymptomatic throughout the procedure. Electronically Signed   By: Malachy Moan M.D.   On: 02/01/2017 11:47      Remington Highbaugh M 02/02/2017  Patient ID: Sydney [name], female   DOB: Aug 15, 1945, 71 y.o.   MRN: [id number]

## 2017-02-02 NOTE — Progress Notes (Signed)
Patient is barely inhaling 500 on IS. Needs encouragement on IS especially when sitting up in chair.   Patient  does not want to walk more than 75 to 100 feet in the hallway.  Seem to need a lot of encouragement to move so she does not get deconditioned.

## 2017-02-03 NOTE — Progress Notes (Signed)
Nurse reviewed discharge instructions with pt.  Pt verbalized understanding of discharge instructions, follow up appointment and new medications.  Pt and her husband taught how to empty, measure and recharge JP drain.  Pt and husband feel comfortable with taking care of JP drain at home.  Prescription given to pt prior to discharge.

## 2017-02-03 NOTE — Progress Notes (Signed)
  General Surgery [facility]  Assessment & Plan: POD#3 - status post robotic hiatal hernia repair with Nissen fundoplication Tolerating pureed diet Monitor drain output - drain to remain in place at discharge OOB, ambulate in halls Patient ready for discharge home today          [name], MD, [facility]       Office: [phone]    Chief Complaint: Hiatal hernia  Subjective: Patient up in chair, some pain.  Tolerating diet.  Small BM.  Wants to go home today.  Objective: Vital signs in last 24 hours: Temp:  [98.6 F (37 C)-99.9 F (37.7 C)] 99.5 F (37.5 C) (11/24 0626) Pulse Rate:  [79-95] 95 (11/24 0626) Resp:  [18] 18 (11/24 0626) BP: (116-140)/(51-67) 140/67 (11/24 0626) SpO2:  [92 %-96 %] 96 % (11/24 0626) Last BM Date: 01/29/17  Intake/Output from previous day: 11/23 0701 - 11/24 0700 In: 1594.3 [P.O.:1180; I.V.:414.3] Out: 1970 [Urine:1895; Drains:75] Intake/Output this shift: No intake/output data recorded.  Physical Exam: HEENT - sclerae clear, mucous membranes moist Neck - soft Chest - clear bilaterally Cor - RRR Abdomen - soft, dressings dry and intact Neuro - alert & oriented, no focal deficits  Lab Results:  No results for input(s): WBC, HGB, HCT, PLT in the last 72 hours. BMET No results for input(s): NA, K, CL, CO2, GLUCOSE, BUN, CREATININE, CALCIUM in the last 72 hours. PT/INR No results for input(s): LABPROT, INR in the last 72 hours. Comprehensive Metabolic Panel:    Component Value Date/Time   NA 141 01/31/2017 0759   NA 139 01/26/2017 0933   K 3.2 (L) 01/31/2017 0759   K 2.9 (L) 01/26/2017 0933   CL 98 (L) 01/26/2017 0933   CO2 31 01/26/2017 0933   BUN 15 01/26/2017 0933   CREATININE 0.70 01/26/2017 0933   GLUCOSE 100 (H) 01/31/2017 0759   GLUCOSE 100 (H) 01/26/2017 0933   CALCIUM 9.7 01/26/2017 0933     Studies/Results: Dg Esophagus W/water Sol Cm  Result Date: 02/01/2017 CLINICAL DATA:  71 year old female status post robotic assisted hiatal hernia repair with Nissen fundoplication. Post- operative assessment. EXAM: ESOPHOGRAM/BARIUM SWALLOW TECHNIQUE: Single contrast examination was performed using water-soluble contrast. FLUOROSCOPY TIME:  Fluoroscopy Time:  1 minutes Radiation Exposure Index (if provided by the fluoroscopic device): 35.5 mGy COMPARISON:  None. FINDINGS: No evidence of extravasation of swallowed contrast material to suggest leak. The gastroesophageal junction is patent. In the region of the Nissen fundoplication, there is a rounded soft tissue density surrounding a relatively narrowed channel of barium contrast likely representing postoperative edema. The channel measures between 2 and 5 mm. Minimal retention of oral contrast within the distal esophagus which clears following dry swallows. Tertiary contractions are present within the distal esophageal wall. The patient remained asymptomatic during the procedure. IMPRESSION: 1. No evidence of postoperative leak. 2. Suspect postoperative soft tissue swelling in the region of the Nissen fundoplication resulting in relative narrowing of the gastroesophageal junction. The minimal diameter measures between 2 and 5 mm. 3. Mild retention of swallowed contrast material in the distal esophagus which clears on repeated dry swallows. The patient remained asymptomatic throughout the procedure. Electronically Signed   By: Malachy Moan M.D.   On: 02/01/2017 11:47      Yuritzi Kamp M 02/03/2017  Patient ID: Sydney [name], female   DOB: 11-22-1945, 71 y.o.   MRN: [id number]

## 2017-02-05 NOTE — Discharge Summary (Signed)
Physician Discharge Summary  Patient ID: Sydney [name] MRN: [id number] DOB/AGE: 72-Jan-1947  71 y.o.  Admit date: 01/31/2017 Discharge date: 02/05/2017   Patient Care Team: Selinda Flavin, MD as PCP - General (Family Medicine) Karie Soda, MD as Consulting Physician (General Surgery) Danis, Andreas Blower, MD as Consulting Physician (Gastroenterology)  Discharge Diagnoses:  Principal Problem:   Paraesophageal hiatal hernia Active Problems:   Obesity   Hypertension   Organoaxial gastric volvulus   3 Days Post-Op  01/31/2017  POST-OPERATIVE DIAGNOSIS:    PARAESOPHAGEAL HIATAL HERNIA WITH ORGANOAXIAL VOLVULUS, REFRACTORY TO MEDICAL MANAGEMENT   PROCEDURE:    1. Robotic Laparoscopic reduction of paraesophageal hiatal hernia 2. Type II mediastinal dissection. 3. Primary repair of hiatal hernia over pledgets. 3.5 Mesh reinforcement (Phasix) of hiatal hernia repair 4. Anterior & posterior gastropexy. 5. Nissen fundoplication 2 cm over a 56-French bougie  SURGEON:  [name], MD   Consults: None  Hospital Course:   The patient underwent the surgery above.  Postoperatively, the patient gradually mobilized and advanced to a pureed diet.  Soft and showed no leak nor obstruction..  Pain and other symptoms were treated aggressively.    By the time of discharge, the patient was walking well the hallways, tolerating liquids and pureed food, having flatus.  Pain was well-controlled on an oral medications.  Based on meeting discharge criteria and continuing to recover, I felt it was safe for the patient to be discharged from the hospital to further recover with close followup. Postoperative recommendations were discussed in detail.  They are written as well.  Discharged Condition: good  Disposition:  Follow-up Information    Karie Soda, MD. Schedule an appointment as soon as possible for a visit in 3 weeks.   Specialty:  General Surgery Contact information: [address] [place] [address] [phone]           01-Home or Self Care  Discharge Instructions    Change dressing (specify)   Complete by:  As directed    Change dry gauze dressing around drain as needed.  May shower.   Diet - low sodium heart healthy   Complete by:  As directed    Increase activity slowly   Complete by:  As directed       Allergies as of 02/03/2017   No Known Allergies     Medication List    TAKE these medications   acetaminophen 500 MG tablet Commonly known as:  TYLENOL Take 1,000 mg every 6 (six) hours as needed by mouth (for pain.).   chlorthalidone 25 MG tablet Commonly known as:  HYGROTON Take 25 mg daily as needed by mouth (for fluid retention/swelling.).   ondansetron 4 MG tablet Commonly known as:  ZOFRAN Take 1 tablet (4 mg total) by mouth every 8 (eight) hours as needed for nausea.   oxyCODONE 5 MG immediate release tablet Commonly known as:  Oxy IR/ROXICODONE Take 1-2 tablets (5-10 mg total) by mouth every 6 (six) hours as needed for severe pain or breakthrough pain.   promethazine 25 MG suppository Commonly known as:  PHENERGAN Place 1 suppository (25 mg total) rectally every 6 (six) hours as needed for nausea.   ranitidine 300 MG tablet Commonly known as:  ZANTAC Take 300 mg by mouth 2 (two) times daily.            Discharge Care Instructions  (From admission, onward)        Start  Ordered   02/03/17 0000  Change dressing (specify)    Comments:  Change dry gauze dressing around drain as needed.  May shower.   02/03/17 0929      Significant Diagnostic Studies:  No results found for this or any previous visit (from the past 72 hour(s)).  No results found.  Discharge Exam: Blood pressure 140/67, pulse 95, temperature 99.5 F (37.5 C), temperature source Oral, resp. rate 18, height 5' (1.524 m), weight 72.1 kg (159 lb), SpO2 96 %.  General: Pt awake/alert/oriented x4 in No acute  distress Eyes: PERRL, normal EOM.  Sclera clear.  No icterus Neuro: CN II-XII intact w/o focal sensory/motor deficits. Lymph: No head/neck/groin lymphadenopathy Psych:  No delerium/psychosis/paranoia HENT: Normocephalic, Mucus membranes moist.  No thrush Neck: Supple, No tracheal deviation Chest: No chest wall pain w good excursion CV:  Pulses intact.  Regular rhythm MS: Normal AROM mjr joints.  No obvious deformity Abdomen: Soft.  Nondistended.  Mildly tender at incisions only.  Drain serosanguinous.  No evidence of peritonitis.  No incarcerated hernias. Ext:  SCDs BLE.  No mjr edema.  No cyanosis Skin: No petechiae / purpura  Past Medical History:  Diagnosis Date  . Arthritis   . Hiatal hernia with obstruction but no gangrene   . Hypertension   . Obesity   . Seasonal allergies   . Thyroid nodule   . Vertigo     Past Surgical History:  Procedure Laterality Date  . CATARACT EXTRACTION, BILATERAL  2016  . CHOLECYSTECTOMY    . ESOPHAGEAL MANOMETRY N/A 01/03/2017   Procedure: ESOPHAGEAL MANOMETRY (EM);  Surgeon: Napoleon Form, MD;  Location: [facility];  Service: Endoscopy;  Laterality: N/A;  . INSERTION OF MESH N/A 01/31/2017   Procedure: INSERTION OF MESH;  Surgeon: Karie Soda, MD;  Location: [facility];  Service: General;  Laterality: N/A;  . MOUTH SURGERY    . TUBAL LIGATION      Social History   Socioeconomic History  . Marital status: Married    Spouse name: Not on file  . Number of children: 2  . Years of education: Not on file  . Highest education level: Not on file  Social Needs  . Financial resource strain: Not on file  . Food insecurity - worry: Not on file  . Food insecurity - inability: Not on file  . Transportation needs - medical: Not on file  . Transportation needs - non-medical: Not on file  Occupational History  . Occupation: retired  Tobacco Use  . Smoking status: Never Smoker  . Smokeless tobacco: Never Used  Substance and Sexual  Activity  . Alcohol use: No  . Drug use: No  . Sexual activity: Not on file  Other Topics Concern  . Not on file  Social History Narrative   4 grandsons, 5 great grands    Family History  Problem Relation Age of Onset  . Hypertension Mother   . Macular degeneration Mother        legally blind  . Heart Problems Mother        skips a beat  . Heart attack Father   . Leukemia Father   . Colon cancer Neg Hx   . Rectal cancer Neg Hx   . Throat cancer Neg Hx   . Esophageal cancer Neg Hx   . Stomach cancer Neg Hx     No current facility-administered medications for this encounter.    Current Outpatient Medications  Medication Sig Dispense Refill  .  acetaminophen (TYLENOL) 500 MG tablet Take 1,000 mg every 6 (six) hours as needed by mouth (for pain.).    Marland Kitchen chlorthalidone (HYGROTON) 25 MG tablet Take 25 mg daily as needed by mouth (for fluid retention/swelling.).     Marland Kitchen ranitidine (ZANTAC) 300 MG tablet Take 300 mg by mouth 2 (two) times daily.    . ondansetron (ZOFRAN) 4 MG tablet Take 1 tablet (4 mg total) by mouth every 8 (eight) hours as needed for nausea. 8 tablet 5  . oxyCODONE (OXY IR/ROXICODONE) 5 MG immediate release tablet Take 1-2 tablets (5-10 mg total) by mouth every 6 (six) hours as needed for severe pain or breakthrough pain. 30 tablet 0  . promethazine (PHENERGAN) 25 MG suppository Place 1 suppository (25 mg total) rectally every 6 (six) hours as needed for nausea. 5 suppository 5     No Known Allergies  Signed: Lorenso Courier, M.D., F.A.C.S. Gastrointestinal and Minimally Invasive Surgery [facility] [address], [address] [place], [place] [address] [phone] Main / Paging   02/05/2017, 1:50 PM

## 2017-02-16 ENCOUNTER — Encounter (HOSPITAL_COMMUNITY): Payer: Self-pay

## 2017-02-16 ENCOUNTER — Emergency Department (HOSPITAL_COMMUNITY): Payer: Medicare Other

## 2017-02-16 ENCOUNTER — Other Ambulatory Visit: Payer: Self-pay

## 2017-02-16 ENCOUNTER — Inpatient Hospital Stay (HOSPITAL_COMMUNITY)
Admission: EM | Admit: 2017-02-16 | Discharge: 2017-02-20 | DRG: 393 | Disposition: A | Discharge status: Home / Self Care | Payer: Medicare Other | Attending: Surgery | Admitting: Surgery

## 2017-02-16 DIAGNOSIS — R4702 Dysphasia: Secondary | ICD-10-CM | POA: Diagnosis not present

## 2017-02-16 DIAGNOSIS — Z806 Family history of leukemia: Secondary | ICD-10-CM

## 2017-02-16 DIAGNOSIS — I119 Hypertensive heart disease without heart failure: Secondary | ICD-10-CM | POA: Diagnosis present

## 2017-02-16 DIAGNOSIS — R1310 Dysphagia, unspecified: Secondary | ICD-10-CM | POA: Diagnosis not present

## 2017-02-16 DIAGNOSIS — K222 Esophageal obstruction: Secondary | ICD-10-CM | POA: Diagnosis not present

## 2017-02-16 DIAGNOSIS — R627 Adult failure to thrive: Secondary | ICD-10-CM | POA: Diagnosis present

## 2017-02-16 DIAGNOSIS — J189 Pneumonia, unspecified organism: Secondary | ICD-10-CM | POA: Diagnosis not present

## 2017-02-16 DIAGNOSIS — E860 Dehydration: Secondary | ICD-10-CM | POA: Diagnosis not present

## 2017-02-16 DIAGNOSIS — Z8249 Family history of ischemic heart disease and other diseases of the circulatory system: Secondary | ICD-10-CM | POA: Diagnosis not present

## 2017-02-16 DIAGNOSIS — Z821 Family history of blindness and visual loss: Secondary | ICD-10-CM | POA: Diagnosis not present

## 2017-02-16 DIAGNOSIS — R6251 Failure to thrive (child): Secondary | ICD-10-CM | POA: Diagnosis present

## 2017-02-16 DIAGNOSIS — Z9841 Cataract extraction status, right eye: Secondary | ICD-10-CM

## 2017-02-16 DIAGNOSIS — E876 Hypokalemia: Secondary | ICD-10-CM | POA: Diagnosis not present

## 2017-02-16 DIAGNOSIS — K449 Diaphragmatic hernia without obstruction or gangrene: Secondary | ICD-10-CM

## 2017-02-16 DIAGNOSIS — Z9842 Cataract extraction status, left eye: Secondary | ICD-10-CM | POA: Diagnosis not present

## 2017-02-16 DIAGNOSIS — J90 Pleural effusion, not elsewhere classified: Secondary | ICD-10-CM

## 2017-02-16 DIAGNOSIS — H8112 Benign paroxysmal vertigo, left ear: Secondary | ICD-10-CM | POA: Diagnosis not present

## 2017-02-16 DIAGNOSIS — K5900 Constipation, unspecified: Secondary | ICD-10-CM | POA: Diagnosis present

## 2017-02-16 DIAGNOSIS — Z9851 Tubal ligation status: Secondary | ICD-10-CM

## 2017-02-16 DIAGNOSIS — Z9049 Acquired absence of other specified parts of digestive tract: Secondary | ICD-10-CM | POA: Diagnosis not present

## 2017-02-16 DIAGNOSIS — M5136 Other intervertebral disc degeneration, lumbar region: Secondary | ICD-10-CM | POA: Diagnosis not present

## 2017-02-16 DIAGNOSIS — K219 Gastro-esophageal reflux disease without esophagitis: Secondary | ICD-10-CM | POA: Diagnosis present

## 2017-02-16 DIAGNOSIS — K9189 Other postprocedural complications and disorders of digestive system: Principal | ICD-10-CM | POA: Diagnosis present

## 2017-02-16 DIAGNOSIS — E669 Obesity, unspecified: Secondary | ICD-10-CM | POA: Diagnosis present

## 2017-02-16 DIAGNOSIS — Z6833 Body mass index (BMI) 33.0-33.9, adult: Secondary | ICD-10-CM

## 2017-02-16 DIAGNOSIS — I10 Essential (primary) hypertension: Secondary | ICD-10-CM | POA: Diagnosis present

## 2017-02-16 LAB — COMPREHENSIVE METABOLIC PANEL
ALBUMIN: 3.5 g/dL (ref 3.5–5.0)
ALK PHOS: 117 U/L (ref 38–126)
ALT: 21 U/L (ref 14–54)
ANION GAP: 16 — AB (ref 5–15)
AST: 19 U/L (ref 15–41)
BUN: 19 mg/dL (ref 6–20)
CALCIUM: 10.1 mg/dL (ref 8.9–10.3)
CHLORIDE: 99 mmol/L — AB (ref 101–111)
CO2: 27 mmol/L (ref 22–32)
Creatinine, Ser: 0.62 mg/dL (ref 0.44–1.00)
GFR calc Af Amer: >60 mL/min (ref >60)
GFR calc non Af Amer: >60 mL/min (ref >60)
GLUCOSE: 107 mg/dL — AB (ref 65–99)
Potassium: 3.4 mmol/L — ABNORMAL LOW (ref 3.5–5.1)
SODIUM: 142 mmol/L (ref 135–145)
Total Bilirubin: 1.6 mg/dL — ABNORMAL HIGH (ref 0.3–1.2)
Total Protein: 7.3 g/dL (ref 6.5–8.1)

## 2017-02-16 LAB — CBC WITH DIFFERENTIAL/PLATELET
BASOS PCT: 0 %
Basophils Absolute: 0 10*3/uL (ref 0.0–0.1)
EOS ABS: 0.1 10*3/uL (ref 0.0–0.7)
Eosinophils Relative: 1 %
HCT: 38.8 % (ref 36.0–46.0)
HEMOGLOBIN: 13.7 g/dL (ref 12.0–15.0)
Lymphocytes Relative: 14 %
Lymphs Abs: 1.5 10*3/uL (ref 0.7–4.0)
MCH: 28.7 pg (ref 26.0–34.0)
MCHC: 35.3 g/dL (ref 30.0–36.0)
MCV: 81.3 fL (ref 78.0–100.0)
Monocytes Absolute: 0.8 10*3/uL (ref 0.1–1.0)
Monocytes Relative: 7 %
NEUTROS PCT: 78 %
Neutro Abs: 8 10*3/uL — ABNORMAL HIGH (ref 1.7–7.7)
Platelets: 614 10*3/uL — ABNORMAL HIGH (ref 150–400)
RBC: 4.77 MIL/uL (ref 3.87–5.11)
RDW: 13 % (ref 11.5–15.5)
WBC: 10.4 10*3/uL (ref 4.0–10.5)

## 2017-02-16 MED ORDER — PANTOPRAZOLE SODIUM 40 MG IV SOLR
40.0000 mg | Freq: Every day | INTRAVENOUS | Status: DC
  Administered 2017-02-16 – 2017-02-18 (×3): 40 mg via INTRAVENOUS
  Filled 2017-02-16 (×3): qty 40

## 2017-02-16 MED ORDER — ONDANSETRON 4 MG PO TBDP
4.0000 mg | ORAL_TABLET | Freq: Four times a day (QID) | ORAL | Status: DC | PRN
Start: 2017-02-16 — End: 2017-02-20

## 2017-02-16 MED ORDER — ENOXAPARIN SODIUM 40 MG/0.4ML ~~LOC~~ SOLN
40.0000 mg | Freq: Every day | SUBCUTANEOUS | Status: DC
  Administered 2017-02-16 – 2017-02-19 (×4): 40 mg via SUBCUTANEOUS
  Filled 2017-02-16 (×5): qty 0.4

## 2017-02-16 MED ORDER — ONDANSETRON HCL 4 MG/2ML IJ SOLN: 4.0000 mg | INTRAMUSCULAR | Status: DC | PRN

## 2017-02-16 MED ORDER — SODIUM CHLORIDE 0.9 % IV BOLUS (SEPSIS)
1000.0000 mL | Freq: Once | INTRAVENOUS | Status: AC
  Administered 2017-02-16: 1000 mL via INTRAVENOUS

## 2017-02-16 MED ORDER — HYDRALAZINE HCL 20 MG/ML IJ SOLN: 10.0000 mg | INTRAMUSCULAR | Status: DC | PRN

## 2017-02-16 MED ORDER — KCL IN DEXTROSE-NACL 20-5-0.9 MEQ/L-%-% IV SOLN
INTRAVENOUS | Status: DC
  Administered 2017-02-16 – 2017-02-17 (×2): via INTRAVENOUS
  Filled 2017-02-16 (×4): qty 1000

## 2017-02-16 NOTE — H&P (Signed)
Sydney [name] is an 71 y.o. female.   Chief Complaint: Can't keep liquids or solids down HPI:  She is status post robotic repair of a large hiatal hernia and a Nissen fundoplication 04/54/0981. She was able to be discharged on her third postoperative day. Over the past 48 hours, she developed progressive dysphagia such that now when she drinks anything eventually regurgitated back up. She feels the volume of regurgitation is actually greater than what she takes in. Her urine is dark. No fever or chills. No abdominal pain. She was evaluated in the emergency department and we were asked to see her.  Chest x-ray was notable for a fluid level within the stomach small right pleural effusion as well as a left pleural effusion. She's not had any coughing. Her husband is here with her.  Past Medical History:  Diagnosis Date  . Arthritis   . Hiatal hernia with obstruction but no gangrene   . Hypertension   . Obesity   . Seasonal allergies   . Thyroid nodule   . Vertigo     Past Surgical History:  Procedure Laterality Date  . CATARACT EXTRACTION, BILATERAL  2016  . CHOLECYSTECTOMY    . ESOPHAGEAL MANOMETRY N/A 01/03/2017   Procedure: ESOPHAGEAL MANOMETRY (EM);  Surgeon: Mauri Pole, MD;  Location: [facility];  Service: Endoscopy;  Laterality: N/A;  . INSERTION OF MESH N/A 01/31/2017   Procedure: INSERTION OF MESH;  Surgeon: Michael Boston, MD;  Location: [facility];  Service: General;  Laterality: N/A;  . MOUTH SURGERY    . TUBAL LIGATION    Robotic hiatal hernia repair and Nissen fundoplication  Family History  Problem Relation Age of Onset  . Hypertension Mother   . Macular degeneration Mother        legally blind  . Heart Problems Mother        skips a beat  . Heart attack Father   . Leukemia Father   . Colon cancer Neg Hx   . Rectal cancer Neg Hx   . Throat cancer Neg Hx   . Esophageal cancer Neg Hx   . Stomach cancer Neg Hx    Social History:  reports that  has never smoked.  she has never used smokeless tobacco. She reports that she does not drink alcohol or use drugs.  Allergies: No Known Allergies   (Not in a hospital admission)  Results for orders placed or performed during the hospital encounter of 02/16/17 (from the past 48 hour(s))  CBC with Differential     Status: Abnormal   Collection Time: 02/16/17  5:50 PM  Result Value Ref Range   WBC 10.4 4.0 - 10.5 K/uL   RBC 4.77 3.87 - 5.11 MIL/uL   Hemoglobin 13.7 12.0 - 15.0 g/dL   HCT 38.8 36.0 - 46.0 %   MCV 81.3 78.0 - 100.0 fL   MCH 28.7 26.0 - 34.0 pg   MCHC 35.3 30.0 - 36.0 g/dL   RDW 13.0 11.5 - 15.5 %   Platelets 614 (H) 150 - 400 K/uL   Neutrophils Relative % 78 %   Neutro Abs 8.0 (H) 1.7 - 7.7 K/uL   Lymphocytes Relative 14 %   Lymphs Abs 1.5 0.7 - 4.0 K/uL   Monocytes Relative 7 %   Monocytes Absolute 0.8 0.1 - 1.0 K/uL   Eosinophils Relative 1 %   Eosinophils Absolute 0.1 0.0 - 0.7 K/uL   Basophils Relative 0 %   Basophils Absolute 0.0 0.0 -  0.1 K/uL  Comprehensive metabolic panel     Status: Abnormal   Collection Time: 02/16/17  5:50 PM  Result Value Ref Range   Sodium 142 135 - 145 mmol/L   Potassium 3.4 (L) 3.5 - 5.1 mmol/L   Chloride 99 (L) 101 - 111 mmol/L   CO2 27 22 - 32 mmol/L   Glucose, Bld 107 (H) 65 - 99 mg/dL   BUN 19 6 - 20 mg/dL   Creatinine, Ser 0.62 0.44 - 1.00 mg/dL   Calcium 10.1 8.9 - 10.3 mg/dL   Total Protein 7.3 6.5 - 8.1 g/dL   Albumin 3.5 3.5 - 5.0 g/dL   AST 19 15 - 41 U/L   ALT 21 14 - 54 U/L   Alkaline Phosphatase 117 38 - 126 U/L   Total Bilirubin 1.6 (H) 0.3 - 1.2 mg/dL   GFR calc non Af Amer >60 >60 mL/min   GFR calc Af Amer >60 >60 mL/min    Comment: (NOTE) The eGFR has been calculated using the CKD EPI equation. This calculation has not been validated in all clinical situations. eGFR's persistently <60 mL/min signify possible Chronic Kidney Disease.    Anion gap 16 (H) 5 - 15   Dg Chest 2 View  Result Date: 02/16/2017 CLINICAL DATA:   Dysphagia for 2 days. EXAM: CHEST  2 VIEW COMPARISON:  Upper GI study 02/01/2017. FINDINGS: A left pleural effusion and basilar airspace disease have increased. The heart is enlarged. A very small right pleural effusion is present. A fluid level is again noted within the stomach. IMPRESSION: 1. Left pleural effusion and lower lobe airspace disease. This is concerning for pneumonia or aspiration. 2. Small right pleural effusion. 3. Fluid level within the stomach as before. Electronically Signed   By: San Morelle M.D.   On: 02/16/2017 18:49   Dg Abdomen 1 View  Result Date: 02/16/2017 CLINICAL DATA:  Discussion x2 days with both foods and liquids. History of hiatal hernia repair x2 weeks ago. EXAM: ABDOMEN - 1 VIEW COMPARISON:  Same day CXR FINDINGS: The bowel gas pattern and stomach are unremarkable. No free air is identified. Bilateral left greater than right pleural effusions. No radio-opaque calculi is identified. Surgical clips in the right upper quadrant are in keeping with cholecystectomy. Dextroconvex curvature of the lower lumbar spine with apex at L4-5 and multilevel lumbar degenerative disc disease. IMPRESSION: 1. Left greater than right pleural effusions partially included on this study. Please refer to the same day CXR report. 2. Unremarkable bowel gas pattern. No bowel obstruction or free air. 3. Degenerative change of the dorsal spine with dextroconvex curvature of the lower lumbar spine. Electronically Signed   By: Ashley Royalty M.D.   On: 02/16/2017 18:49    Review of Systems  Constitutional: Negative for chills and fever.  Respiratory: Negative for cough and shortness of breath.   Cardiovascular: Negative for chest pain.  Gastrointestinal: Negative for abdominal pain.  Genitourinary: Negative for dysuria.    Blood pressure (!) 143/56, pulse 94, temperature 98.1 F (36.7 C), temperature source Oral, resp. rate 14, height 5' (1.524 m), weight 68 kg (150 lb), SpO2 100 %. Physical  Exam  Constitutional:  Overweight elderly female in no acute distress, pleasant and cooperative.  HENT:  Head: Normocephalic and atraumatic.  Eyes: EOM are normal. No scleral icterus.  Neck: Neck supple.  Cardiovascular:  Increased rate, regular rhythm  Respiratory: Effort normal and breath sounds normal.  GI: Soft. There is no tenderness.  Multiple  small incisions some of which have ecchymosis around them. No erythema or drainage to suggest infection.  Musculoskeletal: She exhibits no edema.  Neurological: She is alert.  Skin: Skin is warm and dry.  Psychiatric: She has a normal mood and affect. Her behavior is normal.     Assessment/Plan Progressive dysphagia following hiatal hernia. Nissen fundoplication. Most likely etiology could be some postoperative edema. She is starting to get dehydrated.  Plan: Admit to the hospital. IV fluid hydration. We'll get an upper GI tomorrow. Further plans will be based on the results of the upper GI.  Jackolyn Confer, MD 02/16/2017, 9:09 PM

## 2017-02-16 NOTE — ED Triage Notes (Signed)
Patient states she had a hernia repair 01/31/17 and when she goes to drink water it does not go down. Patient states it immediately comes back up. And has been so x 3 days. Patient called the surgeon and ws instructed to come to the ED.

## 2017-02-16 NOTE — ED Provider Notes (Signed)
[place] [facility] GENERAL SURGERY Provider Note   CSN: [id number] Arrival date & time: 02/16/17  1243     History   Chief Complaint Chief Complaint  Patient presents with  . unable to swallow    HPI TAWONNA ESQUER is a 71 y.o. female.  HPI   Tuesday night tried to swallow pill and couldn't, it came back up again, since then has been only drinking liquids and can't get them down.  Reports that when she swallows, she is able to initiate swallowing but once she swallows it down it comes back up, and is like foam.  Hasn't swallowed any solids since her paraesophageal hiatal hernia repair per their recommendations. Has not had symptoms like this before.    No nausea or vomiting.  No fevers.  Took drain out last Thursday.  Had small BM this AM, reports history of irregularity.  No cough, dyspnea or chest pain.  Symptoms have been constant all day.   Has seen [facility], was sent to [facility] for surgery   Past Medical History:  Diagnosis Date  . Arthritis   . Hiatal hernia with obstruction but no gangrene   . Hypertension   . Obesity   . Seasonal allergies   . Thyroid nodule   . Vertigo     Patient Active Problem List   Diagnosis Date Noted  . Failure to thrive (0-17) 02/16/2017  . Paraesophageal hiatal hernia 01/31/2017  . 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 Obesity   . Hypertension   . Gastroesophageal reflux disease   . Hiatal hernia     Past Surgical History:  Procedure Laterality Date  . CATARACT EXTRACTION, BILATERAL  2016  . CHOLECYSTECTOMY    . ESOPHAGEAL MANOMETRY N/A 01/03/2017   Procedure: ESOPHAGEAL MANOMETRY (EM);  Surgeon: Napoleon Form, MD;  Location: [facility] ENDOSCOPY;  Service: Endoscopy;  Laterality: N/A;  . INSERTION OF MESH N/A 01/31/2017   Procedure: INSERTION OF MESH;  Surgeon: Karie Soda, MD;  Location: [facility] ORS;  Service: General;  Laterality: N/A;  . MOUTH SURGERY    . TUBAL LIGATION      OB History    No  data available       Home Medications    Prior to Admission medications   Medication Sig Start Date End Date Taking? Authorizing Provider  acetaminophen (TYLENOL) 500 MG tablet Take 1,000 mg every 6 (six) hours as needed by mouth (for pain.).   Yes [provider]  chlorthalidone (HYGROTON) 25 MG tablet Take 25 mg daily as needed by mouth (for fluid retention/swelling.).    Yes [provider]  oxyCODONE (OXY IR/ROXICODONE) 5 MG immediate release tablet Take 1-2 tablets (5-10 mg total) by mouth every 6 (six) hours as needed for severe pain or breakthrough pain. 01/31/17  Yes Karie Soda, MD  ranitidine (ZANTAC) 300 MG tablet Take 300 mg by mouth 2 (two) times daily as needed for heartburn.    Yes [provider]  ondansetron (ZOFRAN) 4 MG tablet Take 1 tablet (4 mg total) by mouth every 8 (eight) hours as needed for nausea. 01/31/17   Karie Soda, MD  promethazine (PHENERGAN) 25 MG suppository Place 1 suppository (25 mg total) rectally every 6 (six) hours as needed for nausea. 01/31/17   Karie Soda, MD    Family History Family History  Problem Relation Age of Onset  . Hypertension Mother   . Macular degeneration Mother        legally blind  .  Heart Problems Mother        skips a beat  . Heart attack Father   . Leukemia Father   . Colon cancer Neg Hx   . Rectal cancer Neg Hx   . Throat cancer Neg Hx   . Esophageal cancer Neg Hx   . Stomach cancer Neg Hx     Social History Social History   Tobacco Use  . Smoking status: Never Smoker  . Smokeless tobacco: Never Used  Substance Use Topics  . Alcohol use: No  . Drug use: No     Allergies   Patient has no known allergies.   Review of Systems Review of Systems  Constitutional: Negative for fever.  HENT: Positive for trouble swallowing. Negative for sore throat.   Eyes: Negative for visual disturbance.  Respiratory: Negative for cough and shortness of breath.   Cardiovascular: Negative  for chest pain.  Gastrointestinal: Negative for abdominal pain, constipation, diarrhea, nausea and vomiting.  Genitourinary: Negative for difficulty urinating.  Musculoskeletal: Negative for back pain and neck pain.  Skin: Negative for rash.  Neurological: Negative for syncope and headaches.     Physical Exam Updated Vital Signs BP (!) 132/51 (BP Location: Left Arm)   Pulse 85   Temp 98.4 F (36.9 C) (Oral)   Resp 18   Ht 5' (1.524 m)   Wt 77.4 kg (170 lb 10.2 oz)   SpO2 93%   BMI 33.33 kg/m   Physical Exam  Constitutional: She is oriented to person, place, and time. She appears well-developed and well-nourished. No distress.  HENT:  Head: Normocephalic and atraumatic.  Eyes: Conjunctivae and EOM are normal.  Neck: Normal range of motion.  Cardiovascular: Normal rate, regular rhythm, normal heart sounds and intact distal pulses. Exam reveals no gallop and no friction rub.  No murmur heard. Pulmonary/Chest: Effort normal and breath sounds normal. No respiratory distress. She has no wheezes. She has no rales.  Abdominal: Soft. She exhibits no distension. There is no tenderness. There is no guarding.  Incisions c/d/i  Musculoskeletal: She exhibits no edema or tenderness.  Neurological: She is alert and oriented to person, place, and time.  Skin: Skin is warm and dry. No rash noted. She is not diaphoretic. No erythema.  Nursing note and vitals reviewed.    ED Treatments / Results  Labs (all labs ordered are listed, but only abnormal results are displayed) Labs Reviewed  CBC WITH DIFFERENTIAL/PLATELET - Abnormal; Notable for the following components:      Result Value   Platelets 614 (*)    Neutro Abs 8.0 (*)    All other components within normal limits  COMPREHENSIVE METABOLIC PANEL - Abnormal; Notable for the following components:   Potassium 3.4 (*)    Chloride 99 (*)    Glucose, Bld 107 (*)    Total Bilirubin 1.6 (*)    Anion gap 16 (*)    All other components  within normal limits  BASIC METABOLIC PANEL    EKG  EKG Interpretation None       Radiology Dg Chest 2 View  Result Date: 02/16/2017 CLINICAL DATA:  Dysphagia for 2 days. EXAM: CHEST  2 VIEW COMPARISON:  Upper GI study 02/01/2017. FINDINGS: A left pleural effusion and basilar airspace disease have increased. The heart is enlarged. A very small right pleural effusion is present. A fluid level is again noted within the stomach. IMPRESSION: 1. Left pleural effusion and lower lobe airspace disease. This is concerning for pneumonia or  aspiration. 2. Small right pleural effusion. 3. Fluid level within the stomach as before. Electronically Signed   By: Marin Roberts M.D.   On: 02/16/2017 18:49   Dg Abdomen 1 View  Result Date: 02/16/2017 CLINICAL DATA:  Discussion x2 days with both foods and liquids. History of hiatal hernia repair x2 weeks ago. EXAM: ABDOMEN - 1 VIEW COMPARISON:  Same day CXR FINDINGS: The bowel gas pattern and stomach are unremarkable. No free air is identified. Bilateral left greater than right pleural effusions. No radio-opaque calculi is identified. Surgical clips in the right upper quadrant are in keeping with cholecystectomy. Dextroconvex curvature of the lower lumbar spine with apex at L4-5 and multilevel lumbar degenerative disc disease. IMPRESSION: 1. Left greater than right pleural effusions partially included on this study. Please refer to the same day CXR report. 2. Unremarkable bowel gas pattern. No bowel obstruction or free air. 3. Degenerative change of the dorsal spine with dextroconvex curvature of the lower lumbar spine. Electronically Signed   By: Tollie Eth M.D.   On: 02/16/2017 18:49    Procedures Procedures (including critical care time)  Medications Ordered in ED Medications  enoxaparin (LOVENOX) injection 40 mg (40 mg Subcutaneous Given 02/16/17 2305)  dextrose 5 % and 0.9 % NaCl with KCl 20 mEq/L infusion ( Intravenous New Bag/Given 02/16/17 2305)    ondansetron (ZOFRAN-ODT) disintegrating tablet 4 mg (not administered)    Or  ondansetron (ZOFRAN) injection 4 mg (not administered)  pantoprazole (PROTONIX) injection 40 mg (40 mg Intravenous Given 02/16/17 2306)  hydrALAZINE (APRESOLINE) injection 10 mg (not administered)  sodium chloride 0.9 % bolus 1,000 mL (0 mLs Intravenous Stopped 02/16/17 1928)     Initial Impression / Assessment and Plan / ED Course  I have reviewed the triage vital signs and the nursing notes.  Pertinent labs & imaging results that were available during my care of the patient were reviewed by me and considered in my medical decision making (see chart for details).    71 year old female with a history of recent paraesophageal hiatal hernia repair and Nissen fundoplication on January 31, 2017 with Dr. gross, who presents with concern for dysphasia beginning 3 days ago.  Patient has good palate elevation, midline tongue, does not describe any difficulties with initial swallowing, have low suspicion for neurologic etiology of the symptoms.  She has not of symptoms that suggest gastric volvulus or food impaction.  Discussed with gastroenterology, as well as general surgery.  Dr. Abbey Chatters reports that her symptoms are likely secondary to esophageal edema in the setting of recent surgery.  Given patient is unable to tolerate any p.o. fluids at this time without regurgitation, will admit for hydration, and upper GI study in the morning.  Dr. Abbey Chatters to admit patient. Final Clinical Impressions(s) / ED Diagnoses   Final diagnoses:  Dysphagia    ED Discharge Orders    None       Alvira Monday, MD 02/17/17 [phone]

## 2017-02-16 NOTE — ED Notes (Signed)
Surgery to admit

## 2017-02-17 ENCOUNTER — Inpatient Hospital Stay (HOSPITAL_COMMUNITY): Payer: Medicare Other

## 2017-02-17 DIAGNOSIS — E876 Hypokalemia: Secondary | ICD-10-CM

## 2017-02-17 DIAGNOSIS — J90 Pleural effusion, not elsewhere classified: Secondary | ICD-10-CM

## 2017-02-17 DIAGNOSIS — K222 Esophageal obstruction: Secondary | ICD-10-CM

## 2017-02-17 LAB — BASIC METABOLIC PANEL
ANION GAP: 9 (ref 5–15)
BUN: 16 mg/dL (ref 6–20)
CHLORIDE: 105 mmol/L (ref 101–111)
CO2: 27 mmol/L (ref 22–32)
CREATININE: 0.45 mg/dL (ref 0.44–1.00)
Calcium: 9 mg/dL (ref 8.9–10.3)
GFR calc non Af Amer: >60 mL/min (ref >60)
Glucose, Bld: 134 mg/dL — ABNORMAL HIGH (ref 65–99)
POTASSIUM: 3.1 mmol/L — AB (ref 3.5–5.1)
Sodium: 141 mmol/L (ref 135–145)

## 2017-02-17 LAB — EXPECTORATED SPUTUM ASSESSMENT W REFEX TO RESP CULTURE

## 2017-02-17 LAB — MAGNESIUM: MAGNESIUM: 1.9 mg/dL (ref 1.7–2.4)

## 2017-02-17 LAB — EXPECTORATED SPUTUM ASSESSMENT W GRAM STAIN, RFLX TO RESP C

## 2017-02-17 LAB — STREP PNEUMONIAE URINARY ANTIGEN: Strep Pneumo Urinary Antigen: NEGATIVE

## 2017-02-17 MED ORDER — SODIUM CHLORIDE 0.9 % IV SOLN
25.0000 mg | Freq: Four times a day (QID) | INTRAVENOUS | Status: DC | PRN
  Filled 2017-02-17: qty 1

## 2017-02-17 MED ORDER — HYDROCORTISONE 1 % EX CREA
1.0000 "application " | TOPICAL_CREAM | Freq: Three times a day (TID) | CUTANEOUS | Status: DC | PRN
  Filled 2017-02-17: qty 28

## 2017-02-17 MED ORDER — PHENOL 1.4 % MT LIQD
1.0000 | OROMUCOSAL | Status: DC | PRN
  Filled 2017-02-17: qty 177

## 2017-02-17 MED ORDER — PROMETHAZINE HCL 25 MG RE SUPP
12.5000 mg | Freq: Four times a day (QID) | RECTAL | Status: DC | PRN
Start: 2017-02-17 — End: 2017-02-20

## 2017-02-17 MED ORDER — DIPHENHYDRAMINE HCL 50 MG/ML IJ SOLN: 12.5000 mg | Freq: Four times a day (QID) | INTRAMUSCULAR | Status: DC | PRN

## 2017-02-17 MED ORDER — POTASSIUM CHLORIDE 10 MEQ/100ML IV SOLN
10.0000 meq | Freq: Once | INTRAVENOUS | Status: AC
  Administered 2017-02-17: 10 meq via INTRAVENOUS
  Filled 2017-02-17: qty 100

## 2017-02-17 MED ORDER — SODIUM CHLORIDE 0.9 % IV SOLN
8.0000 mg | Freq: Four times a day (QID) | INTRAVENOUS | Status: DC | PRN
  Filled 2017-02-17: qty 4

## 2017-02-17 MED ORDER — POTASSIUM CHLORIDE 10 MEQ/100ML IV SOLN
10.0000 meq | INTRAVENOUS | Status: AC
  Administered 2017-02-17 (×3): 10 meq via INTRAVENOUS
  Filled 2017-02-17 (×4): qty 100

## 2017-02-17 MED ORDER — ALUM & MAG HYDROXIDE-SIMETH 200-200-20 MG/5ML PO SUSP
30.0000 mL | Freq: Four times a day (QID) | ORAL | Status: DC | PRN
Start: 2017-02-17 — End: 2017-02-20

## 2017-02-17 MED ORDER — VANCOMYCIN HCL 10 G IV SOLR
1250.0000 mg | INTRAVENOUS | Status: DC
  Filled 2017-02-17: qty 1250

## 2017-02-17 MED ORDER — METOCLOPRAMIDE HCL 5 MG/ML IJ SOLN: 5.0000 mg | Freq: Four times a day (QID) | INTRAMUSCULAR | Status: DC | PRN

## 2017-02-17 MED ORDER — HYDRALAZINE HCL 20 MG/ML IJ SOLN: 5.0000 mg | INTRAMUSCULAR | Status: DC | PRN

## 2017-02-17 MED ORDER — LIP MEDEX EX OINT
TOPICAL_OINTMENT | CUTANEOUS | Status: DC | PRN
  Administered 2017-02-17: 11:00:00 via TOPICAL
  Filled 2017-02-17: qty 7

## 2017-02-17 MED ORDER — VANCOMYCIN HCL 10 G IV SOLR
1500.0000 mg | Freq: Once | INTRAVENOUS | Status: AC
  Administered 2017-02-17: 1500 mg via INTRAVENOUS
  Filled 2017-02-17: qty 1500

## 2017-02-17 MED ORDER — METOCLOPRAMIDE HCL 5 MG/ML IJ SOLN
10.0000 mg | Freq: Three times a day (TID) | INTRAMUSCULAR | Status: DC
  Administered 2017-02-17 – 2017-02-19 (×5): 10 mg via INTRAVENOUS
  Filled 2017-02-17 (×5): qty 2

## 2017-02-17 MED ORDER — PROCHLORPERAZINE EDISYLATE 5 MG/ML IJ SOLN: 5.0000 mg | INTRAMUSCULAR | Status: DC | PRN

## 2017-02-17 MED ORDER — MENTHOL 3 MG MT LOZG: 1.0000 | LOZENGE | OROMUCOSAL | Status: DC | PRN

## 2017-02-17 MED ORDER — METOPROLOL TARTRATE 5 MG/5ML IV SOLN: 5.0000 mg | Freq: Four times a day (QID) | INTRAVENOUS | Status: DC | PRN

## 2017-02-17 MED ORDER — IOPAMIDOL (ISOVUE-300) INJECTION 61%
INTRAVENOUS | Status: AC
  Filled 2017-02-17: qty 100

## 2017-02-17 MED ORDER — METHOCARBAMOL 1000 MG/10ML IJ SOLN
1000.0000 mg | Freq: Four times a day (QID) | INTRAMUSCULAR | Status: DC | PRN
  Filled 2017-02-17: qty 10

## 2017-02-17 MED ORDER — HYDROCORTISONE 2.5 % RE CREA
1.0000 "application " | TOPICAL_CREAM | Freq: Four times a day (QID) | RECTAL | Status: DC | PRN
  Filled 2017-02-17: qty 28.35

## 2017-02-17 MED ORDER — ONDANSETRON HCL 4 MG/2ML IJ SOLN
4.0000 mg | Freq: Four times a day (QID) | INTRAMUSCULAR | Status: DC | PRN
  Administered 2017-02-19: 4 mg via INTRAVENOUS
  Filled 2017-02-17: qty 2

## 2017-02-17 MED ORDER — MAGIC MOUTHWASH
15.0000 mL | Freq: Four times a day (QID) | ORAL | Status: DC | PRN
  Filled 2017-02-17: qty 15

## 2017-02-17 MED ORDER — HYDROMORPHONE HCL 1 MG/ML IJ SOLN: 0.5000 mg | INTRAMUSCULAR | Status: DC | PRN

## 2017-02-17 MED ORDER — IOPAMIDOL (ISOVUE-300) INJECTION 61%
100.0000 mL | Freq: Once | INTRAVENOUS | Status: AC | PRN
  Administered 2017-02-17: 100 mL via ORAL

## 2017-02-17 MED ORDER — BISACODYL 10 MG RE SUPP: 10.0000 mg | Freq: Two times a day (BID) | RECTAL | Status: DC | PRN

## 2017-02-17 MED ORDER — LIP MEDEX EX OINT
1.0000 "application " | TOPICAL_OINTMENT | Freq: Two times a day (BID) | CUTANEOUS | Status: DC
  Administered 2017-02-17 – 2017-02-20 (×6): 1 via TOPICAL

## 2017-02-17 MED ORDER — DEXTROSE 5 % IV SOLN
1.0000 g | Freq: Three times a day (TID) | INTRAVENOUS | Status: DC
  Administered 2017-02-17 – 2017-02-20 (×9): 1 g via INTRAVENOUS
  Filled 2017-02-17 (×11): qty 1

## 2017-02-17 MED ORDER — LACTATED RINGERS IV BOLUS (SEPSIS): 1000.0000 mL | Freq: Three times a day (TID) | INTRAVENOUS | Status: AC | PRN

## 2017-02-17 MED ORDER — GUAIFENESIN-DM 100-10 MG/5ML PO SYRP: 10.0000 mL | ORAL_SOLUTION | ORAL | Status: DC | PRN

## 2017-02-17 MED ORDER — DEXAMETHASONE SODIUM PHOSPHATE 4 MG/ML IJ SOLN
4.0000 mg | Freq: Two times a day (BID) | INTRAMUSCULAR | Status: AC
  Administered 2017-02-17 – 2017-02-19 (×4): 4 mg via INTRAVENOUS
  Filled 2017-02-17 (×6): qty 1

## 2017-02-17 NOTE — Progress Notes (Signed)
Pharmacy Antibiotic Note  Sydney [name] is a 71 y.o. female admitted on 02/16/2017 with dysphagia following hiatal hernia.  CT chest concerning for pneumonia.  Pharmacy has been consulted for Vancomycin dosing for pneumonia; MD has also ordered Cefepime x 8 days.  Plan:  Vancomycin 1500mg  IV loading dose x 1 followed by Vancomycni 1250mg  IV q24h (for goal AUC 400-500)  Follow renal function, cultures/sensitivities  Check vancomycin levels when appropriate  Height: 5' (152.4 cm) Weight: 170 lb 10.2 oz (77.4 kg) IBW/kg (Calculated) : 45.5  Temp (24hrs), Avg:98.4 F (36.9 C), Min:98.1 F (36.7 C), Max:98.8 F (37.1 C)  Recent Labs  Lab 02/16/17 1750 02/17/17 0434  WBC 10.4  --   CREATININE 0.62 0.45    Estimated Creatinine Clearance: 58.5 mL/min (by C-G formula based on SCr of 0.45 mg/dL).    No Known Allergies  Antimicrobials this admission: 12/8 Vanc >>   12/8 Cefepime >>    Dose adjustments this admission:    Microbiology results: 12/8 BCx: sent 12/8 Sputum: sent   Thank you for allowing pharmacy to be a part of this patient's care.  14/8, PharmD 02/17/2017 12:23 PM

## 2017-02-17 NOTE — Progress Notes (Signed)
Patient admited from ER. Patient is alert and on stretcher . Patient is able to transfer from stretcher to bed with little assistance. Patient denies pain. Patient is accompanied by spouse.

## 2017-02-17 NOTE — Progress Notes (Signed)
[facility]  [address] [place], [place] [address] Phone: [phone]  FAX: [phone]      Sydney [name] [id number] 21-Apr-1945  CARE TEAM:  PCP: Rory Percy, MD  Outpatient Care Team: Patient Care Team: Rory Percy, MD as PCP - General (Family Medicine) Michael Boston, MD as Consulting Physician (General Surgery) Danis, Kirke Corin, MD as Consulting Physician (Gastroenterology)  Inpatient Treatment Team: Treatment Team: Attending Provider: Michael Boston, MD; Consulting Physician: Michael Boston, MD; Technician: Reuel Derby, NT   Problem List:   Active Problems:   Obesity   Hypertension   Failure to thrive (0-17)   Esophageal obstruction - partial      * No surgery found *     Assessment  Failure to thrive with esophageal obstruction most likely due to postoperative edema versus distal food impaction  Plan:  -IV Decadron times 72 hours.  That usually helps edema resolve.  I almost wonder if she has some impaction distally but fluoroscopy feels this is more mucosal and not intraluminal.  We will see.   Rehydrate gently.  Do not want to overshoot but feels better with fluid.  Hypokalemia.  Correct and check magnesium.  I am skeptical that she truly has pneumonia but with her emesis events and abnormal left lower lobe, treat preemptively and regroup.  Hold off on any drainage of pleural effusion unless things do not improve in a few days.  Likely would do CT chest for 3D evaluation.  It is expected for her to have mediastinal and pleural fluid collections after her large hiatal hernia repair that should gradually resolve in the next few months.  Normally I would diurese her, but with her poor p.o. intake I worried she get too dehydrated.  Because she is having some obstructive symptoms, treat more aggressively than I normally would.  She could have some distal esophageal ischemia and stricturing, but that seems  early.  She may benefit from endoscopic balloon dilations in the future but this is much too soon to consider that.  I would definitely want to hold off.  She would like to avoid doing any more procedures or interventions at this time.  No evidence of any necrosis or perforation.  She does not look toxic.  She does not look like someone who has mediastinitis or major concerns.  -HTN control -VTE prophylaxis- SCDs, etc -mobilize as tolerated to help recovery  45 minutes spent in review, evaluation, examination, counseling, and coordination of care.  More than 50% of that time was spent in counseling.  [name], M.D., F.A.C.S. Gastrointestinal and Minimally Invasive Surgery [facility] [address] [place], [place] [address] [phone] Main / Paging   02/17/2017    Subjective: (Chief complaint)  Feeling tired.  Spitting up liquids.  Family in room.  Objective:  Vital signs:  Vitals:   02/16/17 2239 02/17/17 0142 02/17/17 0624 02/17/17 1000  BP: (!) 151/67 (!) 132/51 119/60 116/60  Pulse: (!) 101 85 86 89  Resp: 18 18 18 18   Temp: 98.5 F (36.9 C) 98.4 F (36.9 C) 98.8 F (37.1 C) 98.2 F (36.8 C)  TempSrc: Oral Oral Oral Oral  SpO2: 98% 93% 94% 96%  Weight: 77.4 kg (170 lb 10.2 oz)     Height: 5' (1.524 m)       Last BM Date: 02/15/17  Intake/Output   Yesterday:  12/07 0701 - 12/08 0700 In: 1972.9 [I.V.:972.9;  IV Piggyback:1000] Out: 0  This shift:  No intake/output data recorded.  Bowel function:  Flatus: YES  BM:  No  Drain: (No drain)   Physical Exam:  General: Pt awake/alert/oriented x4 in no acute distress Eyes: PERRL, normal EOM.  Sclera clear.  No icterus Neuro: CN II-XII intact w/o focal sensory/motor deficits. Lymph: No head/neck/groin lymphadenopathy Psych:  No delerium/psychosis/paranoia HENT: Normocephalic, Mucus membranes moist.  No thrush Neck: Supple, No tracheal deviation Chest: No  chest wall pain w good excursion CV:  Pulses intact.  Regular rhythm MS: Normal AROM mjr joints.  No obvious deformity  Abdomen: Soft.  Nondistended.  Mildly tender at incisions only.  No evidence of peritonitis.  No incarcerated hernias.  Ext:  No deformity.  No mjr edema.  No cyanosis Skin: No petechiae / purpura  Results:   Labs: Results for orders placed or performed during the hospital encounter of 02/16/17 (from the past 48 hour(s))  CBC with Differential     Status: Abnormal   Collection Time: 02/16/17  5:50 PM  Result Value Ref Range   WBC 10.4 4.0 - 10.5 K/uL   RBC 4.77 3.87 - 5.11 MIL/uL   Hemoglobin 13.7 12.0 - 15.0 g/dL   HCT 38.8 36.0 - 46.0 %   MCV 81.3 78.0 - 100.0 fL   MCH 28.7 26.0 - 34.0 pg   MCHC 35.3 30.0 - 36.0 g/dL   RDW 13.0 11.5 - 15.5 %   Platelets 614 (H) 150 - 400 K/uL   Neutrophils Relative % 78 %   Neutro Abs 8.0 (H) 1.7 - 7.7 K/uL   Lymphocytes Relative 14 %   Lymphs Abs 1.5 0.7 - 4.0 K/uL   Monocytes Relative 7 %   Monocytes Absolute 0.8 0.1 - 1.0 K/uL   Eosinophils Relative 1 %   Eosinophils Absolute 0.1 0.0 - 0.7 K/uL   Basophils Relative 0 %   Basophils Absolute 0.0 0.0 - 0.1 K/uL  Comprehensive metabolic panel     Status: Abnormal   Collection Time: 02/16/17  5:50 PM  Result Value Ref Range   Sodium 142 135 - 145 mmol/L   Potassium 3.4 (L) 3.5 - 5.1 mmol/L   Chloride 99 (L) 101 - 111 mmol/L   CO2 27 22 - 32 mmol/L   Glucose, Bld 107 (H) 65 - 99 mg/dL   BUN 19 6 - 20 mg/dL   Creatinine, Ser 0.62 0.44 - 1.00 mg/dL   Calcium 10.1 8.9 - 10.3 mg/dL   Total Protein 7.3 6.5 - 8.1 g/dL   Albumin 3.5 3.5 - 5.0 g/dL   AST 19 15 - 41 U/L   ALT 21 14 - 54 U/L   Alkaline Phosphatase 117 38 - 126 U/L   Total Bilirubin 1.6 (H) 0.3 - 1.2 mg/dL   GFR calc non Af Amer >60 >60 mL/min   GFR calc Af Amer >60 >60 mL/min    Comment: (NOTE) The eGFR has been calculated using the CKD EPI equation. This calculation has not been validated in all  clinical situations. eGFR's persistently <60 mL/min signify possible Chronic Kidney Disease.    Anion gap 16 (H) 5 - 15  Basic metabolic panel     Status: Abnormal   Collection Time: 02/17/17  4:34 AM  Result Value Ref Range   Sodium 141 135 - 145 mmol/L   Potassium 3.1 (L) 3.5 - 5.1 mmol/L   Chloride 105 101 - 111 mmol/L   CO2 27 22 - 32 mmol/L  Glucose, Bld 134 (H) 65 - 99 mg/dL   BUN 16 6 - 20 mg/dL   Creatinine, Ser 0.45 0.44 - 1.00 mg/dL   Calcium 9.0 8.9 - 10.3 mg/dL   GFR calc non Af Amer >60 >60 mL/min   GFR calc Af Amer >60 >60 mL/min    Comment: (NOTE) The eGFR has been calculated using the CKD EPI equation. This calculation has not been validated in all clinical situations. eGFR's persistently <60 mL/min signify possible Chronic Kidney Disease.    Anion gap 9 5 - 15    Imaging / Studies: Dg Chest 2 View  Result Date: 02/16/2017 CLINICAL DATA:  Dysphagia for 2 days. EXAM: CHEST  2 VIEW COMPARISON:  Upper GI study 02/01/2017. FINDINGS: A left pleural effusion and basilar airspace disease have increased. The heart is enlarged. A very small right pleural effusion is present. A fluid level is again noted within the stomach. IMPRESSION: 1. Left pleural effusion and lower lobe airspace disease. This is concerning for pneumonia or aspiration. 2. Small right pleural effusion. 3. Fluid level within the stomach as before. Electronically Signed   By: San Morelle M.D.   On: 02/16/2017 18:49   Dg Abdomen 1 View  Result Date: 02/16/2017 CLINICAL DATA:  Discussion x2 days with both foods and liquids. History of hiatal hernia repair x2 weeks ago. EXAM: ABDOMEN - 1 VIEW COMPARISON:  Same day CXR FINDINGS: The bowel gas pattern and stomach are unremarkable. No free air is identified. Bilateral left greater than right pleural effusions. No radio-opaque calculi is identified. Surgical clips in the right upper quadrant are in keeping with cholecystectomy. Dextroconvex curvature of  the lower lumbar spine with apex at L4-5 and multilevel lumbar degenerative disc disease. IMPRESSION: 1. Left greater than right pleural effusions partially included on this study. Please refer to the same day CXR report. 2. Unremarkable bowel gas pattern. No bowel obstruction or free air. 3. Degenerative change of the dorsal spine with dextroconvex curvature of the lower lumbar spine. Electronically Signed   By: Ashley Royalty M.D.   On: 02/16/2017 18:49   Dg Esophagus W/water Sol Cm  Result Date: 02/17/2017 CLINICAL DATA:  Previous Nissen fundoplication 3 weeks ago. Initially did well postop. Recent history of dysphagia with solids and liquids. EXAM: ESOPHOGRAM/BARIUM SWALLOW TECHNIQUE: Single contrast examination was performed using water-soluble contrast. FLUOROSCOPY TIME:  1 min ; 27 mGy COMPARISON:  02/01/2017 FINDINGS: Scout image demonstrates metallic clips from cholecystectomy. Patient swallowed the water-soluble contrast without difficulty. Esophagus is unremarkable proximally. Short-segment web-like narrowing of the lumen at the GE junction. There is a complex mucosal invagination at the level of the fundoplication just below the diaphragm which is new since previous examination, resulting in significant narrowing of the lumen. The lumen returns to near normal caliber at the distal level of the fundoplication. There is very limited passage of contrast material distally into the lumen of the stomach. There is a fluid level in the nondilated stomach. Barium tablet was not administered due to high grade obstruction. No evidence of leak. IMPRESSION: 1. Progressive near-obstructive complex narrowing of the distal esophageal lumen at the level of the fundoplication. 2.  Negative for leak. Electronically Signed   By: Lucrezia Europe M.D.   On: 02/17/2017 10:24    Medications / Allergies: per chart  Antibiotics: Anti-infectives (From admission, onward)   None        Note: Portions of this report may  have been transcribed using voice recognition software. Every effort  was made to ensure accuracy; however, inadvertent computerized transcription errors may be present.   Any transcriptional errors that result from this process are unintentional.     [name], M.D., F.A.C.S. Gastrointestinal and Minimally Invasive Surgery [facility] [address], [place], [place] [address] [phone] Main / Paging   02/17/2017

## 2017-02-17 NOTE — Progress Notes (Signed)
Initial Nutrition Assessment  DOCUMENTATION CODES:   Obesity unspecified  INTERVENTION:   Diet advancement per MD RD will monitor for plan and then provide appropriate nutrition intervention  NUTRITION DIAGNOSIS:   Inadequate oral intake related to dysphagia, vomiting(intolerance to any intake) as evidenced by per patient/family report.  GOAL:   Patient will meet greater than or equal to 90% of their needs  MONITOR:   PO intake, Diet advancement, Weight trends, Labs, I & O's  REASON FOR ASSESSMENT:   Malnutrition Screening Tool    ASSESSMENT:   She is status post robotic repair of a large hiatal hernia and a Nissen fundoplication 01/31/2017. She was able to be discharged on her third postoperative day. Over the past 48 hours, she developed progressive dysphagia such that now when she drinks anything eventually regurgitated back up. She feels the volume of regurgitation is actually greater than what she takes in.  Patient in room with family at bedside. Pt attempting to sip on some clear liquids during visit. Pt has only taken in a little cranberry juice. Pt states she had hiatal hernia repair and nissen fundoplication on 11/21. Pt has had poor appetite and has not been able to get liquids down at all since surgery. On 12/3, pt tried to swallow Tylenol and since then everything comes back up.  Pt tried nutrition supplements (Ensure/Boost), states these were "raunchy". Will trial alternative supplements if diet is advanced. Will monitor for plan to determine appropriate interventions.  Pt reports UBW is 175 lb. Per chart review, pt has lost 17 lb since 9/6 (10% wt loss x 3 months, significant for time frame).   Medications: IV Decadron BID, IV Reglan every 8 hours, IV Protonix daily, D5 and .9% NaCl w/ KCl infusion at 50 ml/hr -provides 204 kcal Labs reviewed: Low K Mg WNL   NUTRITION - FOCUSED PHYSICAL EXAM:    Most Recent Value  Orbital Region  No depletion  Upper Arm  Region  No depletion  Thoracic and Lumbar Region  No depletion  Buccal Region  No depletion  Temple Region  No depletion  Clavicle Bone Region  No depletion  Clavicle and Acromion Bone Region  No depletion  Scapular Bone Region  Unable to assess  Dorsal Hand  No depletion  Patellar Region  Unable to assess  Anterior Thigh Region  Unable to assess  Posterior Calf Region  Unable to assess  Edema (RD Assessment)  None       Diet Order:  Diet clear liquid Room service appropriate? Yes; Fluid consistency: Thin; Fluid restriction: 1200 mL Fluid  EDUCATION NEEDS:   Not appropriate for education at this time  Skin:  Skin Assessment: Skin Integrity Issues: Skin Integrity Issues:: Incisions Incisions: abdominal  Last BM:  12/6  Height:   Ht Readings from Last 1 Encounters:  02/16/17 5' (1.524 m)    Weight:   Wt Readings from Last 1 Encounters:  02/16/17 170 lb 10.2 oz (77.4 kg)    Ideal Body Weight:  45.5 kg  BMI:  Body mass index is 33.33 kg/m.  Estimated Nutritional Needs:   Kcal:  1700-1900  Protein:  70-80g  Fluid:  1.9L/day    Tilda Franco, MS, RD, LDN [facility] Inpatient Clinical Dietitian Pager: [phone] After Hours Pager: [phone]

## 2017-02-18 LAB — CBC
HEMATOCRIT: 35 % — AB (ref 36.0–46.0)
HEMOGLOBIN: 11.8 g/dL — AB (ref 12.0–15.0)
MCH: 27.9 pg (ref 26.0–34.0)
MCHC: 33.7 g/dL (ref 30.0–36.0)
MCV: 82.7 fL (ref 78.0–100.0)
Platelets: 534 10*3/uL — ABNORMAL HIGH (ref 150–400)
RBC: 4.23 MIL/uL (ref 3.87–5.11)
RDW: 13.1 % (ref 11.5–15.5)
WBC: 9.8 10*3/uL (ref 4.0–10.5)

## 2017-02-18 LAB — BASIC METABOLIC PANEL
ANION GAP: 8 (ref 5–15)
BUN: 10 mg/dL (ref 6–20)
CALCIUM: 9.3 mg/dL (ref 8.9–10.3)
CHLORIDE: 108 mmol/L (ref 101–111)
CO2: 26 mmol/L (ref 22–32)
Creatinine, Ser: 0.45 mg/dL (ref 0.44–1.00)
GFR calc non Af Amer: >60 mL/min (ref >60)
Glucose, Bld: 153 mg/dL — ABNORMAL HIGH (ref 65–99)
POTASSIUM: 3.7 mmol/L (ref 3.5–5.1)
Sodium: 142 mmol/L (ref 135–145)

## 2017-02-18 LAB — MAGNESIUM: MAGNESIUM: 1.8 mg/dL (ref 1.7–2.4)

## 2017-02-18 LAB — MRSA PCR SCREENING: MRSA by PCR: NEGATIVE

## 2017-02-18 MED ORDER — VANCOMYCIN HCL IN DEXTROSE 750-5 MG/150ML-% IV SOLN
750.0000 mg | INTRAVENOUS | Status: DC
  Administered 2017-02-18: 750 mg via INTRAVENOUS
  Filled 2017-02-18 (×2): qty 150

## 2017-02-18 MED ORDER — SODIUM CHLORIDE 3 % IN NEBU
4.0000 mL | INHALATION_SOLUTION | Freq: Once | RESPIRATORY_TRACT | Status: AC | PRN
  Administered 2017-02-18: 4 mL via RESPIRATORY_TRACT
  Filled 2017-02-18: qty 4

## 2017-02-18 NOTE — Progress Notes (Signed)
Paged Dr. Michaell Cowing re: sputum sample. VORB ok for respiratory to indicate and place order needed. Spoke with Angie in RT; relayed above. She states she will place order. Will watch for order and continue to monitor pt.

## 2017-02-18 NOTE — Progress Notes (Signed)
Pharmacy Antibiotic Note  Sydney [name] is a 71 y.o. female admitted on 02/16/2017 with dysphagia following hiatal hernia.  CT chest concerning for pneumonia.  Pharmacy has been consulted for Vancomycin dosing for pneumonia; MD has also ordered Cefepime x 8 days.  Plan:  Decrease vancomycin to 750mg  IV q24h for estimated AUC 468 with SCr rounded to 0.8  Follow renal function, cultures/sensitivities  Check vancomycin peak and trough at steady state to calculate AUC (goal 400-500)  Height: 5' (152.4 cm) Weight: 170 lb 10.2 oz (77.4 kg) IBW/kg (Calculated) : 45.5  Temp (24hrs), Avg:98.1 F (36.7 C), Min:97.7 F (36.5 C), Max:98.5 F (36.9 C)  Recent Labs  Lab 02/16/17 1750 02/17/17 0434 02/18/17 0521  WBC 10.4  --  9.8  CREATININE 0.62 0.45 0.45    Estimated Creatinine Clearance: 58.5 mL/min (by C-G formula based on SCr of 0.45 mg/dL).    No Known Allergies  Antimicrobials this admission:  12/8 vanc >>   12/8 cefepime >>    Dose adjustments this admission:  12/9 decreased to 750mg  q24h from 1250mg  q24h   Microbiology results:  12/8 BCx: sent 12/8 Sputum: sample not acceptable for testing 12/9 MRSA PCR:  Thank you for allowing pharmacy to be a part of this patient's care.  14/8, PharmD, BCPS Pager: [phone] 02/18/2017 10:22 AM

## 2017-02-18 NOTE — Progress Notes (Signed)
[facility]  [address] [place], [place] [address] Phone: [phone]  FAX: [phone] 1946-01-04  CARE TEAM:  PCP: Rory Percy, MD  Outpatient Care Team: Patient Care Team: Rory Percy, MD as PCP - General (Family Medicine) Michael Boston, MD as Consulting Physician (General Surgery) Rhine, Kirke Corin, MD as Consulting Physician (Gastroenterology)  Inpatient Treatment Team: Treatment Team: Attending Provider: Michael Boston, MD; Consulting Physician: Michael Boston, MD; Technician: Reuel Derby, NT; Registered Nurse: Darrell Jewel, RN   Problem List:   Principal Problem:   Esophageal obstruction - partial Active Problems:   Paraesophageal hiatal hernia s/p repair 01/31/2017   Obesity   Hypertension   Failure to thrive (0-17)   BPPV (benign paroxysmal positional vertigo), left   Pleural effusion, left      * No surgery found *     Assessment  Failure to thrive with esophageal obstruction most likely due to postoperative edema versus distal food impaction.  Feeling a little better.  Plan:  IV Decadron times 72 hours.  That usually helps edema resolve.  I almost wonder if she has some impaction distally but fluoroscopy feels this is more mucosal and not intraluminal.  We will see.   Rehydrate gently.  Do not want to overshoot but feels better with fluid.  Retry oral intake.  Liquids only for now.  Hypokalemia.  Corrected.  Follow with magnesium.  I am skeptical that she truly has pneumonia but with her emesis events and abnormal left lower lobe, treat preemptively and regroup.  Hold off on any drainage of pleural effusion unless things do not improve in a few days.  Likely would do CT chest for 3D evaluation.  It is expected for her to have mediastinal and pleural fluid collections after her large hiatal hernia repair that should gradually resolve in the next few months.  Normally I  would diurese her, but with her poor p.o. intake I worried she get too dehydrated.  Because she is having some obstructive symptoms, treat more aggressively than I normally would.  She could have some distal esophageal ischemia and stricturing, but that seems early.  She does not act toxic with mediastinitis.  She may benefit from endoscopic balloon dilations in the future but this is much too soon to consider that.  I would definitely want to hold off.  She would like to avoid doing any more procedures or interventions at this time.  No evidence of any necrosis or perforation.  She does not look toxic.  She does not look like someone who has mediastinitis or major concerns.  -HTN control -VTE prophylaxis- SCDs, etc -mobilize as tolerated to help recovery  45 minutes spent in review, evaluation, examination, counseling, and coordination of care.  More than 50% of that time was spent in counseling.  [name], M.D., F.A.C.S. Gastrointestinal and Minimally Invasive Surgery [facility] [address], [place], [place] [address] [phone] Main / Paging   02/18/2017    Subjective: (Chief complaint)  Feeling better. Washing up in bathroom.  Getting around better. Keeping popsicles and some liquids down now. Husband in room.    Objective:  Vital signs:  Vitals:   02/17/17 1405 02/17/17 1800 02/17/17 2037 02/18/17 0558  BP: (!) 141/71 122/67 126/61 (!) 113/58  Pulse: (!) 103 96 82 66  Resp: '16 16 16 16  '$ Temp: 98.2 F (36.8 C)  98.5 F (36.9 C) 97.8 F (36.6 C) 97.7 F (36.5 C)  TempSrc: Oral Oral Oral Oral  SpO2: 93% 96% 97% 96%  Weight:      Height:        Last BM Date: 02/15/17  Intake/Output   Yesterday:  12/08 0701 - 12/09 0700 In: 1495.8 [P.O.:180; I.V.:965.8; IV Piggyback:350] Out: 625 [Urine:625] This shift:  No intake/output data recorded.  Bowel function:  Flatus: No  BM:  YES  Drain: (No drain)   Physical  Exam:  General: Pt awake/alert/oriented x4 in no acute distress Eyes: PERRL, normal EOM.  Sclera clear.  No icterus Neuro: CN II-XII intact w/o focal sensory/motor deficits. Lymph: No head/neck/groin lymphadenopathy Psych:  No delerium/psychosis/paranoia HENT: Normocephalic, Mucus membranes moist.  No thrush Neck: Supple, No tracheal deviation Chest: No chest wall pain w good excursion CV:  Pulses intact.  Regular rhythm MS: Normal AROM mjr joints.  No obvious deformity  Abdomen: Soft.  Nondistended.  Nontender.  No evidence of peritonitis.  No incarcerated hernias.  Ext:  No deformity.  No mjr edema.  No cyanosis Skin: No petechiae / purpura  Results:   Labs: Results for orders placed or performed during the hospital encounter of 02/16/17 (from the past 48 hour(s))  CBC with Differential     Status: Abnormal   Collection Time: 02/16/17  5:50 PM  Result Value Ref Range   WBC 10.4 4.0 - 10.5 K/uL   RBC 4.77 3.87 - 5.11 MIL/uL   Hemoglobin 13.7 12.0 - 15.0 g/dL   HCT 38.8 36.0 - 46.0 %   MCV 81.3 78.0 - 100.0 fL   MCH 28.7 26.0 - 34.0 pg   MCHC 35.3 30.0 - 36.0 g/dL   RDW 13.0 11.5 - 15.5 %   Platelets 614 (H) 150 - 400 K/uL   Neutrophils Relative % 78 %   Neutro Abs 8.0 (H) 1.7 - 7.7 K/uL   Lymphocytes Relative 14 %   Lymphs Abs 1.5 0.7 - 4.0 K/uL   Monocytes Relative 7 %   Monocytes Absolute 0.8 0.1 - 1.0 K/uL   Eosinophils Relative 1 %   Eosinophils Absolute 0.1 0.0 - 0.7 K/uL   Basophils Relative 0 %   Basophils Absolute 0.0 0.0 - 0.1 K/uL  Comprehensive metabolic panel     Status: Abnormal   Collection Time: 02/16/17  5:50 PM  Result Value Ref Range   Sodium 142 135 - 145 mmol/L   Potassium 3.4 (L) 3.5 - 5.1 mmol/L   Chloride 99 (L) 101 - 111 mmol/L   CO2 27 22 - 32 mmol/L   Glucose, Bld 107 (H) 65 - 99 mg/dL   BUN 19 6 - 20 mg/dL   Creatinine, Ser 0.62 0.44 - 1.00 mg/dL   Calcium 10.1 8.9 - 10.3 mg/dL   Total Protein 7.3 6.5 - 8.1 g/dL   Albumin 3.5 3.5 -  5.0 g/dL   AST 19 15 - 41 U/L   ALT 21 14 - 54 U/L   Alkaline Phosphatase 117 38 - 126 U/L   Total Bilirubin 1.6 (H) 0.3 - 1.2 mg/dL   GFR calc non Af Amer >60 >60 mL/min   GFR calc Af Amer >60 >60 mL/min    Comment: (NOTE) The eGFR has been calculated using the CKD EPI equation. This calculation has not been validated in all clinical situations. eGFR's persistently <60 mL/min signify possible Chronic Kidney Disease.    Anion gap 16 (H) 5 - 15  Basic metabolic  panel     Status: Abnormal   Collection Time: 02/17/17  4:34 AM  Result Value Ref Range   Sodium 141 135 - 145 mmol/L   Potassium 3.1 (L) 3.5 - 5.1 mmol/L   Chloride 105 101 - 111 mmol/L   CO2 27 22 - 32 mmol/L   Glucose, Bld 134 (H) 65 - 99 mg/dL   BUN 16 6 - 20 mg/dL   Creatinine, Ser 0.45 0.44 - 1.00 mg/dL   Calcium 9.0 8.9 - 10.3 mg/dL   GFR calc non Af Amer >60 >60 mL/min   GFR calc Af Amer >60 >60 mL/min    Comment: (NOTE) The eGFR has been calculated using the CKD EPI equation. This calculation has not been validated in all clinical situations. eGFR's persistently <60 mL/min signify possible Chronic Kidney Disease.    Anion gap 9 5 - 15  Magnesium     Status: None   Collection Time: 02/17/17  4:34 AM  Result Value Ref Range   Magnesium 1.9 1.7 - 2.4 mg/dL  Strep pneumoniae urinary antigen     Status: None   Collection Time: 02/17/17  2:18 PM  Result Value Ref Range   Strep Pneumo Urinary Antigen NEGATIVE NEGATIVE    Comment: PERFORMED AT [facility]        Infection due to S. pneumoniae cannot be absolutely ruled out since the antigen present may be below the detection limit of the test.   Culture, sputum-assessment     Status: None   Collection Time: 02/17/17  3:10 PM  Result Value Ref Range   Specimen Description EXPECTORATED SPUTUM    Special Requests NONE    Sputum evaluation      Sputum specimen not acceptable for testing.  Please recollect.   CALLED B.RICHARDSON,RN [id number] '@[id number]'$  BY  V.WILKINS    Report Status 02/17/2017 FINAL   Basic metabolic panel     Status: Abnormal   Collection Time: 02/18/17  5:21 AM  Result Value Ref Range   Sodium 142 135 - 145 mmol/L   Potassium 3.7 3.5 - 5.1 mmol/L   Chloride 108 101 - 111 mmol/L   CO2 26 22 - 32 mmol/L   Glucose, Bld 153 (H) 65 - 99 mg/dL   BUN 10 6 - 20 mg/dL   Creatinine, Ser 0.45 0.44 - 1.00 mg/dL   Calcium 9.3 8.9 - 10.3 mg/dL   GFR calc non Af Amer >60 >60 mL/min   GFR calc Af Amer >60 >60 mL/min    Comment: (NOTE) The eGFR has been calculated using the CKD EPI equation. This calculation has not been validated in all clinical situations. eGFR's persistently <60 mL/min signify possible Chronic Kidney Disease.    Anion gap 8 5 - 15  CBC     Status: Abnormal   Collection Time: 02/18/17  5:21 AM  Result Value Ref Range   WBC 9.8 4.0 - 10.5 K/uL   RBC 4.23 3.87 - 5.11 MIL/uL   Hemoglobin 11.8 (L) 12.0 - 15.0 g/dL   HCT 35.0 (L) 36.0 - 46.0 %   MCV 82.7 78.0 - 100.0 fL   MCH 27.9 26.0 - 34.0 pg   MCHC 33.7 30.0 - 36.0 g/dL   RDW 13.1 11.5 - 15.5 %   Platelets 534 (H) 150 - 400 K/uL  Magnesium     Status: None   Collection Time: 02/18/17  5:21 AM  Result Value Ref Range   Magnesium 1.8 1.7 - 2.4 mg/dL  Imaging / Studies: Dg Chest 2 View  Result Date: 02/16/2017 CLINICAL DATA:  Dysphagia for 2 days. EXAM: CHEST  2 VIEW COMPARISON:  Upper GI study 02/01/2017. FINDINGS: A left pleural effusion and basilar airspace disease have increased. The heart is enlarged. A very small right pleural effusion is present. A fluid level is again noted within the stomach. IMPRESSION: 1. Left pleural effusion and lower lobe airspace disease. This is concerning for pneumonia or aspiration. 2. Small right pleural effusion. 3. Fluid level within the stomach as before. Electronically Signed   By: San Morelle M.D.   On: 02/16/2017 18:49   Dg Abdomen 1 View  Result Date: 02/16/2017 CLINICAL DATA:  Discussion x2 days with  both foods and liquids. History of hiatal hernia repair x2 weeks ago. EXAM: ABDOMEN - 1 VIEW COMPARISON:  Same day CXR FINDINGS: The bowel gas pattern and stomach are unremarkable. No free air is identified. Bilateral left greater than right pleural effusions. No radio-opaque calculi is identified. Surgical clips in the right upper quadrant are in keeping with cholecystectomy. Dextroconvex curvature of the lower lumbar spine with apex at L4-5 and multilevel lumbar degenerative disc disease. IMPRESSION: 1. Left greater than right pleural effusions partially included on this study. Please refer to the same day CXR report. 2. Unremarkable bowel gas pattern. No bowel obstruction or free air. 3. Degenerative change of the dorsal spine with dextroconvex curvature of the lower lumbar spine. Electronically Signed   By: Ashley Royalty M.D.   On: 02/16/2017 18:49   Dg Esophagus W/water Sol Cm  Result Date: 02/17/2017 CLINICAL DATA:  Previous Nissen fundoplication 3 weeks ago. Initially did well postop. Recent history of dysphagia with solids and liquids. EXAM: ESOPHOGRAM/BARIUM SWALLOW TECHNIQUE: Single contrast examination was performed using water-soluble contrast. FLUOROSCOPY TIME:  1 min ; 27 mGy COMPARISON:  02/01/2017 FINDINGS: Scout image demonstrates metallic clips from cholecystectomy. Patient swallowed the water-soluble contrast without difficulty. Esophagus is unremarkable proximally. Short-segment web-like narrowing of the lumen at the GE junction. There is a complex mucosal invagination at the level of the fundoplication just below the diaphragm which is new since previous examination, resulting in significant narrowing of the lumen. The lumen returns to near normal caliber at the distal level of the fundoplication. There is very limited passage of contrast material distally into the lumen of the stomach. There is a fluid level in the nondilated stomach. Barium tablet was not administered due to high grade  obstruction. No evidence of leak. IMPRESSION: 1. Progressive near-obstructive complex narrowing of the distal esophageal lumen at the level of the fundoplication. 2.  Negative for leak. Electronically Signed   By: Lucrezia Europe M.D.   On: 02/17/2017 10:24    Medications / Allergies: per chart  Antibiotics: Anti-infectives (From admission, onward)   Start     Dose/Rate Route Frequency Ordered Stop   02/18/17 1300  vancomycin (VANCOCIN) 1,250 mg in sodium chloride 0.9 % 250 mL IVPB     1,250 mg 166.7 mL/hr over 90 Minutes Intravenous Every 24 hours 02/17/17 1222     02/17/17 1300  ceFEPIme (MAXIPIME) 1 g in dextrose 5 % 50 mL IVPB     1 g 100 mL/hr over 30 Minutes Intravenous Every 8 hours 02/17/17 1208 02/25/17 1259   02/17/17 1300  vancomycin (VANCOCIN) 1,500 mg in sodium chloride 0.9 % 500 mL IVPB     1,500 mg 250 mL/hr over 120 Minutes Intravenous  Once 02/17/17 1222 02/17/17 1647  Note: Portions of this report may have been transcribed using voice recognition software. Every effort was made to ensure accuracy; however, inadvertent computerized transcription errors may be present.   Any transcriptional errors that result from this process are unintentional.     Donis Kotowski C. Conal Shetley, M.D., F.A.C.S. Gastrointestinal and Minimally Invasive Surgery [facility] [address], [address] [place], [place] [address] [phone] Main / Paging   02/18/2017  

## 2017-02-19 MED ORDER — POLYETHYLENE GLYCOL 3350 17 G PO PACK
17.0000 g | PACK | Freq: Two times a day (BID) | ORAL | Status: DC
  Administered 2017-02-19: 17 g via ORAL
  Filled 2017-02-19 (×2): qty 1

## 2017-02-19 MED ORDER — METOCLOPRAMIDE HCL 5 MG/ML IJ SOLN: 5.0000 mg | Freq: Four times a day (QID) | INTRAMUSCULAR | Status: DC | PRN

## 2017-02-19 MED ORDER — PANTOPRAZOLE SODIUM 40 MG PO TBEC
40.0000 mg | DELAYED_RELEASE_TABLET | Freq: Every day | ORAL | Status: DC
  Administered 2017-02-19: 40 mg via ORAL
  Filled 2017-02-19: qty 1

## 2017-02-19 MED ORDER — HYDROMORPHONE HCL 1 MG/ML IJ SOLN: 0.5000 mg | INTRAMUSCULAR | Status: DC | PRN

## 2017-02-19 MED ORDER — SIMETHICONE 40 MG/0.6ML PO SUSP
40.0000 mg | Freq: Four times a day (QID) | ORAL | Status: DC
  Administered 2017-02-19 – 2017-02-20 (×5): 40 mg via ORAL
  Filled 2017-02-19 (×6): qty 0.6

## 2017-02-19 NOTE — Progress Notes (Signed)
[facility]  [address] [place], [place] [address] Phone: [phone]  FAX: [place] [phone] [birth year]  CARE TEAM:  PCP: Rory Percy, MD  Outpatient Care Team: Patient Care Team: Rory Percy, MD as PCP - General (Family Medicine) Michael Boston, MD as Consulting Physician (General Surgery) [place], Kirke Corin, MD as Consulting Physician (Gastroenterology)  Inpatient Treatment Team: Treatment Team: Attending Provider: Michael Boston, MD; Consulting Physician: Michael Boston, MD; Technician: Reuel Derby, NT; Registered Nurse: Darrell Jewel, RN; Registered Nurse: Oleta Mouse, RN; Registered Nurse: Mickie Kay, RN; Registered Nurse: Charlyne Petrin, RN   Problem List:   Principal Problem:   Esophageal obstruction - partial Active Problems:   Paraesophageal hiatal hernia s/p repair 01/31/2017   Obesity   Hypertension   Failure to thrive (0-17)   BPPV (benign paroxysmal positional vertigo), left   Pleural effusion, left   Hypokalemia      * No surgery found *     Assessment  Failure to thrive with esophageal obstruction most likely due to postoperative edema versus distal food impaction.  Feeling a little better.  Plan:  IV Decadron times 72 hours.  That usually helps edema resolve.  I almost wonder if she has some impaction distally but fluoroscopy feels this is more mucosal and not intraluminal.  We will see.   Wean off IV fluids  Retry oral intake.  Liquids only for now.  Overall better although guarded with the heartburn last night  Hypokalemia.  Corrected.  Follow with magnesium.  I am skeptical that she truly has pneumonia but with her emesis events and abnormal left lower lobe, treat preemptively and regroup.  Hold off on any drainage of pleural effusion unless things do not improve in a few days.  Likely would do CT chest for 3D evaluation.  It is expected for her to have  mediastinal and pleural fluid collections after her large hiatal hernia repair that should gradually resolve in the next few months.  Normally I would diurese her, but with her poor p.o. intake I worried she get too dehydrated.  Because she is having some obstructive symptoms, treat more aggressively than I normally would.  She could have some distal esophageal ischemia and stricturing, but that seems early.  She does not act toxic with mediastinitis.  She may benefit from endoscopic balloon dilations in the future but this is much too soon to consider that.  I would definitely want to hold off.  She would like to avoid doing any more procedures or interventions at this time.  No evidence of any necrosis or perforation.  She does not look toxic.  She does not look like someone who has mediastinitis or major concerns.  -HTN control -GERD control -VTE prophylaxis- SCDs, etc -mobilize as tolerated to help recovery  25 minutes spent in review, evaluation, examination, counseling, and coordination of care.  More than 50% of that time was spent in counseling.  [name], M.D., F.A.C.S. Gastrointestinal and Minimally Invasive Surgery [facility] [address] [place], [place] [address] [phone] Main / Paging   02/19/2017    Subjective: (Chief complaint)  Tolerating liquids better overall but spit up some of the broth last night.  Tired of having liquids only. Walked 6 times in the hallways.  Energy level coming back. Husband in room.    Objective:  Vital signs:  Vitals:  02/18/17 0558 02/18/17 1355 02/18/17 2015 02/19/17 0622  BP: (!) 113/58 (!) 128/55 121/69 (!) 110/48  Pulse: 66 83 73 65  Resp: _0 Temp: 97.7 F (36.5 C) 98 F (36.7 C) 98.6 F (37 C) 98.4 F (36.9 C)  TempSrc: Oral Oral Oral Oral  SpO2: 96% 96% 95% 96%  Weight:      Height:        Last BM Date: 02/15/17  Intake/Output   Yesterday:  12/09 0701 -  12/10 0700 In: 2120 [P.O.:170; I.V.:1600; IV Piggyback:350] Out: 122 [Urine:875] This shift:  No intake/output data recorded.  Bowel function:  Flatus: No  BM:  No  Drain: (No drain)   Physical Exam:  General: Pt awake/alert/oriented x4 in no acute distress Eyes: PERRL, normal EOM.  Sclera clear.  No icterus Neuro: CN II-XII intact w/o focal sensory/motor deficits. Lymph: No head/neck/groin lymphadenopathy Psych:  No delerium/psychosis/paranoia HENT: Normocephalic, Mucus membranes moist.  No thrush Neck: Supple, No tracheal deviation Chest: No chest wall pain w good excursion.  No wheezing.  No conversational dyspnea. CV:  Pulses intact.  Regular rhythm MS: Normal AROM mjr joints.  No obvious deformity  Abdomen: Soft.  Nondistended.  Nontender.  No evidence of peritonitis.  No incarcerated hernias.  Ext:  No deformity.  No mjr edema.  No cyanosis Skin: No petechiae / purpura  Results:   Labs: Results for orders placed or performed during the hospital encounter of 02/16/17 (from the past 48 hour(s))  Culture, blood (routine x 2) Call MD if unable to obtain prior to antibiotics being given     Status: None (Preliminary result)   Collection Time: 02/17/17 12:19 PM  Result Value Ref Range   Specimen Description BLOOD LEFT ANTECUBITAL    Special Requests      BOTTLES DRAWN AEROBIC AND ANAEROBIC Blood Culture adequate volume   Culture      NO GROWTH 1 DAY Performed at [facility], [place] [address], [place], [place] [address]    Report Status PENDING   Culture, blood (routine x 2) Call MD if unable to obtain prior to antibiotics being given     Status: None (Preliminary result)   Collection Time: 02/17/17 12:26 PM  Result Value Ref Range   Specimen Description BLOOD BLOOD RIGHT HAND    Special Requests      BOTTLES DRAWN AEROBIC AND ANAEROBIC Blood Culture adequate volume   Culture      NO GROWTH 1 DAY Performed at [facility], [place] [address]  [place], [place] [address]    Report Status PENDING   Strep pneumoniae urinary antigen     Status: None   Collection Time: 02/17/17  2:18 PM  Result Value Ref Range   Strep Pneumo Urinary Antigen NEGATIVE NEGATIVE    Comment: PERFORMED AT [facility]        Infection due to S. pneumoniae cannot be absolutely ruled out since the antigen present may be below the detection limit of the test.   Culture, sputum-assessment     Status: None   Collection Time: 02/17/17  3:10 PM  Result Value Ref Range   Specimen Description EXPECTORATED SPUTUM    Special Requests NONE    Sputum evaluation      Sputum specimen not acceptable for testing.  Please recollect.   CALLED B.RICHARDSON,RN [id number] _1  BY V.WILKINS    Report Status 02/17/2017 FINAL   Basic metabolic panel     Status:  Abnormal   Collection Time: 02/18/17  5:21 AM  Result Value Ref Range   Sodium 142 135 - 145 mmol/L   Potassium 3.7 3.5 - 5.1 mmol/L   Chloride 108 101 - 111 mmol/L   CO2 26 22 - 32 mmol/L   Glucose, Bld 153 (H) 65 - 99 mg/dL   BUN 10 6 - 20 mg/dL   Creatinine, Ser 0.45 0.44 - 1.00 mg/dL   Calcium 9.3 8.9 - 10.3 mg/dL   GFR calc non Af Amer >60 >60 mL/min   GFR calc Af Amer >60 >60 mL/min    Comment: (NOTE) The eGFR has been calculated using the CKD EPI equation. This calculation has not been validated in all clinical situations. eGFR's persistently <60 mL/min signify possible Chronic Kidney Disease.    Anion gap 8 5 - 15  CBC     Status: Abnormal   Collection Time: 02/18/17  5:21 AM  Result Value Ref Range   WBC 9.8 4.0 - 10.5 K/uL   RBC 4.23 3.87 - 5.11 MIL/uL   Hemoglobin 11.8 (L) 12.0 - 15.0 g/dL   HCT 35.0 (L) 36.0 - 46.0 %   MCV 82.7 78.0 - 100.0 fL   MCH 27.9 26.0 - 34.0 pg   MCHC 33.7 30.0 - 36.0 g/dL   RDW 13.1 11.5 - 15.5 %   Platelets 534 (H) 150 - 400 K/uL  Magnesium     Status: None   Collection Time: 02/18/17  5:21 AM  Result Value Ref Range   Magnesium 1.8 1.7 - 2.4 mg/dL  MRSA  PCR Screening     Status: None   Collection Time: 02/18/17 10:20 AM  Result Value Ref Range   MRSA by PCR NEGATIVE NEGATIVE    Comment:        The GeneXpert MRSA Assay (FDA approved for NASAL specimens only), is one component of a comprehensive MRSA colonization surveillance program. It is not intended to diagnose MRSA infection nor to guide or monitor treatment for MRSA infections.     Imaging / Studies: Dg Esophagus W/water Sol Cm  Result Date: 02/17/2017 CLINICAL DATA:  Previous Nissen fundoplication 3 weeks ago. Initially did well postop. Recent history of dysphagia with solids and liquids. EXAM: ESOPHOGRAM/BARIUM SWALLOW TECHNIQUE: Single contrast examination was performed using water-soluble contrast. FLUOROSCOPY TIME:  1 min ; 27 mGy COMPARISON:  02/01/2017 FINDINGS: Scout image demonstrates metallic clips from cholecystectomy. Patient swallowed the water-soluble contrast without difficulty. Esophagus is unremarkable proximally. Short-segment web-like narrowing of the lumen at the GE junction. There is a complex mucosal invagination at the level of the fundoplication just below the diaphragm which is new since previous examination, resulting in significant narrowing of the lumen. The lumen returns to near normal caliber at the distal level of the fundoplication. There is very limited passage of contrast material distally into the lumen of the stomach. There is a fluid level in the nondilated stomach. Barium tablet was not administered due to high grade obstruction. No evidence of leak. IMPRESSION: 1. Progressive near-obstructive complex narrowing of the distal esophageal lumen at the level of the fundoplication. 2.  Negative for leak. Electronically Signed   By: Lucrezia Europe M.D.   On: 02/17/2017 10:24    Medications / Allergies: per chart  Antibiotics: Anti-infectives (From admission, onward)   Start     Dose/Rate Route Frequency Ordered Stop   02/18/17 1800  vancomycin (VANCOCIN)  IVPB 750 mg/150 ml premix     750 mg 150 mL/hr over  60 Minutes Intravenous Every 24 hours 02/18/17 1022     02/18/17 1300  vancomycin (VANCOCIN) 1,250 mg in sodium chloride 0.9 % 250 mL IVPB  Status:  Discontinued     1,250 mg 166.7 mL/hr over 90 Minutes Intravenous Every 24 hours 02/17/17 1222 02/18/17 1022   02/17/17 1300  ceFEPIme (MAXIPIME) 1 g in dextrose 5 % 50 mL IVPB     1 g 100 mL/hr over 30 Minutes Intravenous Every 8 hours 02/17/17 1208 02/25/17 1259   02/17/17 1300  vancomycin (VANCOCIN) 1,500 mg in sodium chloride 0.9 % 500 mL IVPB     1,500 mg 250 mL/hr over 120 Minutes Intravenous  Once 02/17/17 1222 02/17/17 1647        Note: Portions of this report may have been transcribed using voice recognition software. Every effort was made to ensure accuracy; however, inadvertent computerized transcription errors may be present.   Any transcriptional errors that result from this process are unintentional.     [name], M.D., F.A.C.S. Gastrointestinal and Minimally Invasive Surgery [facility] [address] [place], [place] [address] [phone] Main / Paging   02/19/2017

## 2017-02-20 MED ORDER — CHLORTHALIDONE 25 MG PO TABS
25.0000 mg | ORAL_TABLET | Freq: Every day | ORAL | Status: DC | PRN
  Filled 2017-02-20: qty 1

## 2017-02-20 MED ORDER — FLUCONAZOLE 100 MG PO TABS
200.0000 mg | ORAL_TABLET | Freq: Once | ORAL | Status: AC
  Administered 2017-02-20: 200 mg via ORAL
  Filled 2017-02-20: qty 2

## 2017-02-20 MED ORDER — FAMOTIDINE 20 MG PO TABS
20.0000 mg | ORAL_TABLET | Freq: Two times a day (BID) | ORAL | Status: DC
  Administered 2017-02-20: 20 mg via ORAL
  Filled 2017-02-20: qty 1

## 2017-02-20 MED ORDER — ONDANSETRON HCL 4 MG PO TABS: 4.0000 mg | ORAL_TABLET | Freq: Three times a day (TID) | ORAL | 5 refills | Status: DC | PRN

## 2017-02-20 MED ORDER — SIMETHICONE 40 MG/0.6ML PO SUSP: 40.0000 mg | Freq: Four times a day (QID) | ORAL | 0 refills | Status: DC

## 2017-02-20 MED ORDER — DEXAMETHASONE SODIUM PHOSPHATE 4 MG/ML IJ SOLN
8.0000 mg | Freq: Once | INTRAMUSCULAR | Status: AC
  Administered 2017-02-20: 8 mg via INTRAVENOUS
  Filled 2017-02-20: qty 2

## 2017-02-20 NOTE — Discharge Instructions (Signed)
DRINK ONLY THIN LIQUIDS UNTIL SEEN IN CLINIC NEXT WEEK Crush medications or split them as needed to able to swallow them.  EATING AFTER YOUR ESOPHAGEAL SURGERY (Stomach Fundoplication, Hiatal Hernia repair, Achalasia surgery, etc)  ######################################################################  EAT Start with a THIN liquid diet (see below)  WALK Walk an hour a day.  Control your pain to do that.    CONTROL PAIN Control pain so that you can walk, sleep, tolerate sneezing/coughing, go up/down stairs.  HAVE A BOWEL MOVEMENT DAILY Keep your bowels regular to avoid problems.  OK to try a laxative to override constipation.  OK to use an antidairrheal to slow down diarrhea.  Call if not better after 2 tries  CALL IF YOU HAVE PROBLEMS/CONCERNS Call if you are still struggling despite following these instructions. Call if you have concerns not answered by these instructions  ######################################################################   After your esophageal surgery, expect some sticking with swallowing over the next 1-2 months.    If food sticks when you eat, it is called "dysphagia".  This is due to swelling around your esophagus at the wrap & hiatal diaphragm repair.  It will gradually ease off over the next few months.  To help you through this temporary phase, we start you out on a THIN LIQUID diet.   Some BASIC RULES to follow are:  Maintain an upright position whenever eating or drinking.  Take small sips.  Eat slowly.  It may also help to eat only one food at a time.  Consider nibbling through smaller, more frequent meals & avoid the urge to eat BIG meals  Do not push through feelings of fullness, nausea, or bloatedness  Do not mix solid foods and liquids in the same mouthful  Try not to "wash foods down" with large gulps of liquids.  Avoid carbonated (bubbly/fizzy) drinks.    Avoid foods that make you feel gassy or bloated.  Start with bland foods  first.  Wait on trying greasy, fried, or spicy meals until you are tolerating more bland solids well.  Understand that it will be hard to burp and belch at first.  This gradually improves with time.  Expect to be more gassy/flatulent/bloated initially.  Walking will help your body manage it better.  Consider using medications for bloating that contain simethicone such as  Maalox or Gas-X   Eat in a relaxed atmosphere & minimize distractions.  Avoid talking while eating.    Do not use straws.  Following each meal, sit in an upright position (90 degree angle) for 60 to 90 minutes.  Going for a short walk can help as well  If food does stick, don't panic.  Try to relax and let the food pass on its own.  Sipping WARM LIQUID such as strong hot black tea can also help slide it down.   Be gradual in changes & use common sense:  -If you easily tolerating a certain "level" of foods, advance to the next level gradually -If you are having trouble swallowing a particular food, then avoid it.   -If food is sticking when you advance your diet, go back to thinner previous diet (the lower LEVEL) for 1-2 days.

## 2017-02-20 NOTE — Care Management Important Message (Signed)
Important Message  Patient Details  Name: NELIAH CUYLER MRN: [id number] Date of Birth: [birth year]   Medicare Important Message Given:  Yes    Caren Macadam 02/20/2017, 12:10 PMImportant Message  Patient Details  Name: KASSADI PRESSWOOD MRN: [id number] Date of Birth: 1946-02-28   Medicare Important Message Given:  Yes    Caren Macadam 02/20/2017, 12:10 PM

## 2017-02-20 NOTE — Discharge Summary (Signed)
Physician Discharge Summary  Patient ID: Sydney [name] MRN: [id number] DOB/AGE: 1945/03/23  71 y.o.  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  She initially recovered and went home postoperative day 2.  Then she had worsening dysphasia.  Readmitted.  Upper GI swallow study concerning for distal esophageal swelling in near obstruction.  Left-sided pleural effusion.  Admitted.  Placed on IV antibiotics.  Placed on IV steroids.  Dysphasia decreased.  Breathing improved.  Constipation resolved.  Able to tolerate normal volume of thin liquids without difficulty.   Based on meeting discharge criteria and continuing to recover, I felt it was safe for the patient to be discharged from the hospital to further recover with close followup. Postoperative recommendations were discussed in detail.  They are written as well.  Discharged Condition: good  Disposition:  Follow-up Information    Michael Boston, MD. Schedule an appointment as soon as possible for a visit in 1 week(s).   Specialty:  General Surgery Contact information: [address] [place] [address] [phone]           01-Home or Self Care  Discharge  Instructions    Call MD for:   Complete by:  As directed    Temperature > 101.58F   Call MD for:  extreme fatigue   Complete by:  As directed    Call MD for:  hives   Complete by:  As directed    Call MD for:  persistant nausea and vomiting   Complete by:  As directed    Call MD for:  redness, tenderness, or signs of infection (pain, swelling, redness, odor or green/yellow discharge around incision site)   Complete by:  As directed    Call MD for:  severe uncontrolled pain   Complete by:  As directed    Diet general   Complete by:  As directed    Start out on a Burns. Crush or split pills as needed to help with swallowing Expect some sticking with swallowing over the next 1-2 months.   This is due to swelling around your esophagus at the wrap & hiatal diaphragm repair.  It will gradually ease off over the next few months.   Discharge instructions   Complete by:  As directed    Please see discharge instruction sheets.   Also refer to any handouts/printouts that may have been given from the [facility] surgery office (if you visited [place] there before surgery) Please call our office if you have any questions or concerns [phone]   Driving Restrictions   Complete by:  As directed    No driving until off narcotics and can safely swerve away without  pain during an emergency   Increase activity slowly   Complete by:  As directed    Lifting restrictions   Complete by:  As directed    Avoid heavy lifting initially, <20 pounds at first.   Do not push through pain.   You have no specific weight limit: If it hurts to do, DON'T DO IT.    If you feel no pain, you are not injuring anything.  Pain will protect you from injury.   Coughing and sneezing are far more stressful to your incision than any lifting.   Avoid resuming heavy lifting (>50 pounds) or other intense activity until off all narcotic pain medications.   When want to exercise more, give yourself 2 weeks to gradually get back  to full intense exercise/activity.   May shower / Bathe   Complete by:  As directed    [place].  It is fine for dressings or wounds to be washed/rinsed.  Use gentle soap & water.  This will help the incisions and/or wounds get clean & minimize infection.   May walk up steps   Complete by:  As directed    Remove dressing in 72 hours   Complete by:  As directed    You have closed incisions: Shower and bathe over these incisions with soap and water every day.  It is OK to wash over the dressings: they are waterproof. Remove all surgical dressings on postoperative day #3.  You do not need to replace dressings over the closed incisions unless you feel more comfortable with a Band-Aid covering it.   Please call our office [phone] if you have further questions.   Sexual Activity Restrictions   Complete by:  As directed    Sexual activity as tolerated.  Do not push through pain.  Pain will protect you from injury.   Walk with assistance   Complete by:  As directed    Walk over an hour a day.  May use a walker/cane/companion to help with balance and stamina.      Allergies as of 02/20/2017   No Known Allergies     Medication List    TAKE these medications   acetaminophen 500 MG tablet Commonly known as:  TYLENOL Take 1,000 mg every 6 (six) hours as needed by mouth (for pain.).   chlorthalidone 25 MG tablet Commonly known as:  HYGROTON Take 25 mg daily as needed by mouth (for fluid retention/swelling.).   ondansetron 4 MG tablet Commonly known as:  ZOFRAN Take 1 tablet (4 mg total) by mouth every 8 (eight) hours as needed for nausea.   oxyCODONE 5 MG immediate release tablet Commonly known as:  Oxy IR/ROXICODONE Take 1-2 tablets (5-10 mg total) by mouth every 6 (six) hours as needed for severe pain or breakthrough pain.   promethazine 25 MG suppository Commonly known as:  PHENERGAN Place 1 suppository (25 mg total) rectally every 6 (six) hours as needed for  nausea.   ranitidine 300 MG tablet Commonly known as:  ZANTAC Take 300 mg by mouth 2 (two) times daily as needed for heartburn.   simethicone 40 MG/0.6ML drops Commonly known as:  MYLICON Take 0.6 mLs (40 mg total) by mouth 4 (four) times daily.       Significant Diagnostic Studies:  Results for orders placed or performed during the hospital encounter of 02/16/17 (from the past 72 hour(s))  Culture, blood (routine x 2) Call MD if unable to obtain prior to antibiotics being given  Status: None (Preliminary result)   Collection Time: 02/17/17 12:19 PM  Result Value Ref Range   Specimen Description BLOOD LEFT ANTECUBITAL    Special Requests      BOTTLES DRAWN AEROBIC AND ANAEROBIC Blood Culture adequate volume   Culture      NO GROWTH 2 DAYS Performed at [facility], [place] [address], [place], [place] [address]    Report Status PENDING   Culture, blood (routine x 2) Call MD if unable to obtain prior to antibiotics being given     Status: None (Preliminary result)   Collection Time: 02/17/17 12:26 PM  Result Value Ref Range   Specimen Description BLOOD BLOOD RIGHT HAND    Special Requests      BOTTLES DRAWN AEROBIC AND ANAEROBIC Blood Culture adequate volume   Culture      NO GROWTH 2 DAYS Performed at [facility], [place] [address] [place], [place] [address]    Report Status PENDING   Strep pneumoniae urinary antigen     Status: None   Collection Time: 02/17/17  2:18 PM  Result Value Ref Range   Strep Pneumo Urinary Antigen NEGATIVE NEGATIVE    Comment: PERFORMED AT [facility]        Infection due to S. pneumoniae cannot be absolutely ruled out since the antigen present may be below the detection limit of the test.   Culture, sputum-assessment     Status: None   Collection Time: 02/17/17  3:10 PM  Result Value Ref Range   Specimen Description EXPECTORATED SPUTUM    Special Requests NONE    Sputum evaluation      Sputum specimen not acceptable  for testing.  Please recollect.   CALLED B.RICHARDSON,RN [id number] @1559  BY V.WILKINS    Report Status 02/17/2017 FINAL   Basic metabolic panel     Status: Abnormal   Collection Time: 02/18/17  5:21 AM  Result Value Ref Range   Sodium 142 135 - 145 mmol/L   Potassium 3.7 3.5 - 5.1 mmol/L   Chloride 108 101 - 111 mmol/L   CO2 26 22 - 32 mmol/L   Glucose, Bld 153 (H) 65 - 99 mg/dL   BUN 10 6 - 20 mg/dL   Creatinine, Ser 0.45 0.44 - 1.00 mg/dL   Calcium 9.3 8.9 - 10.3 mg/dL   GFR calc non Af Amer >60 >60 mL/min   GFR calc Af Amer >60 >60 mL/min    Comment: (NOTE) The eGFR has been calculated using the CKD EPI equation. This calculation has not been validated in all clinical situations. eGFR's persistently <60 mL/min signify possible Chronic Kidney Disease.    Anion gap 8 5 - 15  CBC     Status: Abnormal   Collection Time: 02/18/17  5:21 AM  Result Value Ref Range   WBC 9.8 4.0 - 10.5 K/uL   RBC 4.23 3.87 - 5.11 MIL/uL   Hemoglobin 11.8 (L) 12.0 - 15.0 g/dL   HCT 35.0 (L) 36.0 - 46.0 %   MCV 82.7 78.0 - 100.0 fL   MCH 27.9 26.0 - 34.0 pg   MCHC 33.7 30.0 - 36.0 g/dL   RDW 13.1 11.5 - 15.5 %   Platelets 534 (H) 150 - 400 K/uL  Magnesium     Status: None   Collection Time: 02/18/17  5:21 AM  Result Value Ref Range   Magnesium 1.8 1.7 - 2.4 mg/dL  MRSA PCR Screening     Status: None  Collection Time: 02/18/17 10:20 AM  Result Value Ref Range   MRSA by PCR NEGATIVE NEGATIVE    Comment:        The GeneXpert MRSA Assay (FDA approved for NASAL specimens only), is one component of a comprehensive MRSA colonization surveillance program. It is not intended to diagnose MRSA infection nor to guide or monitor treatment for MRSA infections.     Dg Chest 2 View  Result Date: 02/16/2017 CLINICAL DATA:  Dysphagia for 2 days. EXAM: CHEST  2 VIEW COMPARISON:  Upper GI study 02/01/2017. FINDINGS: A left pleural effusion and basilar airspace disease have increased. The heart is  enlarged. A very small right pleural effusion is present. A fluid level is again noted within the stomach. IMPRESSION: 1. Left pleural effusion and lower lobe airspace disease. This is concerning for pneumonia or aspiration. 2. Small right pleural effusion. 3. Fluid level within the stomach as before. Electronically Signed   By: San Morelle M.D.   On: 02/16/2017 18:49   Dg Abdomen 1 View  Result Date: 02/16/2017 CLINICAL DATA:  Discussion x2 days with both foods and liquids. History of hiatal hernia repair x2 weeks ago. EXAM: ABDOMEN - 1 VIEW COMPARISON:  Same day CXR FINDINGS: The bowel gas pattern and stomach are unremarkable. No free air is identified. Bilateral left greater than right pleural effusions. No radio-opaque calculi is identified. Surgical clips in the right upper quadrant are in keeping with cholecystectomy. Dextroconvex curvature of the lower lumbar spine with apex at L4-5 and multilevel lumbar degenerative disc disease. IMPRESSION: 1. Left greater than right pleural effusions partially included on this study. Please refer to the same day CXR report. 2. Unremarkable bowel gas pattern. No bowel obstruction or free air. 3. Degenerative change of the dorsal spine with dextroconvex curvature of the lower lumbar spine. Electronically Signed   By: Ashley Royalty M.D.   On: 02/16/2017 18:49   Dg Esophagus W/water Sol Cm  Result Date: 02/17/2017 CLINICAL DATA:  Previous Nissen fundoplication 3 weeks ago. Initially did well postop. Recent history of dysphagia with solids and liquids. EXAM: ESOPHOGRAM/BARIUM SWALLOW TECHNIQUE: Single contrast examination was performed using water-soluble contrast. FLUOROSCOPY TIME:  1 min ; 27 mGy COMPARISON:  02/01/2017 FINDINGS: Scout image demonstrates metallic clips from cholecystectomy. Patient swallowed the water-soluble contrast without difficulty. Esophagus is unremarkable proximally. Short-segment web-like narrowing of the lumen at the GE junction.  There is a complex mucosal invagination at the level of the fundoplication just below the diaphragm which is new since previous examination, resulting in significant narrowing of the lumen. The lumen returns to near normal caliber at the distal level of the fundoplication. There is very limited passage of contrast material distally into the lumen of the stomach. There is a fluid level in the nondilated stomach. Barium tablet was not administered due to high grade obstruction. No evidence of leak. IMPRESSION: 1. Progressive near-obstructive complex narrowing of the distal esophageal lumen at the level of the fundoplication. 2.  Negative for leak. Electronically Signed   By: Lucrezia Europe M.D.   On: 02/17/2017 10:24    Discharge Exam: Blood pressure (!) 118/58, pulse 63, temperature 98 F (36.7 C), temperature source Oral, resp. rate 16, height 5' (1.524 m), weight 77.4 kg (170 lb 10.2 oz), SpO2 96 %.  General: Pt awake/alert/oriented x4 in No acute distress Eyes: PERRL, normal EOM.  Sclera clear.  No icterus Neuro: CN II-XII intact w/o focal sensory/motor deficits. Lymph: No head/neck/groin lymphadenopathy Psych:  No delerium/psychosis/paranoia  HENT: Normocephalic, Mucus membranes moist.  No thrush Neck: Supple, No tracheal deviation Chest: No chest wall pain w good excursion CV:  Pulses intact.  Regular rhythm MS: Normal AROM mjr joints.  No obvious deformity Abdomen: Soft.  Nondistended.  Nontender.  Port site incisions well-healed without hernia.  No evidence of peritonitis.  No incarcerated hernias. Ext:  SCDs BLE.  No mjr edema.  No cyanosis Skin: No petechiae / purpura  Past Medical History:  Diagnosis Date  . Arthritis   . Hiatal hernia with obstruction but no gangrene   . Hypertension   . Obesity   . Seasonal allergies   . Thyroid nodule   . Vertigo     Past Surgical History:  Procedure Laterality Date  . CATARACT EXTRACTION, BILATERAL  2016  . CHOLECYSTECTOMY    . ESOPHAGEAL  MANOMETRY N/A 01/03/2017   Procedure: ESOPHAGEAL MANOMETRY (EM);  Surgeon: Mauri Pole, MD;  Location: [facility];  Service: Endoscopy;  Laterality: N/A;  . INSERTION OF MESH N/A 01/31/2017   Procedure: INSERTION OF MESH;  Surgeon: Michael Boston, MD;  Location: [facility];  Service: General;  Laterality: N/A;  . MOUTH SURGERY    . TUBAL LIGATION      Social History   Socioeconomic History  . Marital status: Married    Spouse name: Not on file  . Number of children: 2  . Years of education: Not on file  . Highest education level: Not on file  Social Needs  . Financial resource strain: Not on file  . Food insecurity - worry: Not on file  . Food insecurity - inability: Not on file  . Transportation needs - medical: Not on file  . Transportation needs - non-medical: Not on file  Occupational History  . Occupation: retired  Tobacco Use  . Smoking status: Never Smoker  . Smokeless tobacco: Never Used  Substance and Sexual Activity  . Alcohol use: No  . Drug use: No  . Sexual activity: Not on file  Other Topics Concern  . Not on file  Social History Narrative   4 grandsons, 5 great grands    Family History  Problem Relation Age of Onset  . Hypertension Mother   . Macular degeneration Mother        legally blind  . Heart Problems Mother        skips a beat  . Heart attack Father   . Leukemia Father   . Colon cancer Neg Hx   . Rectal cancer Neg Hx   . Throat cancer Neg Hx   . Esophageal cancer Neg Hx   . Stomach cancer Neg Hx     Current Facility-Administered Medications  Medication Dose Route Frequency Provider Last Rate Last Dose  . alum & mag hydroxide-simeth (MAALOX/MYLANTA) 200-200-20 MG/5ML suspension 30 mL  30 mL Oral Q6H PRN Michael Boston, MD      . bisacodyl (DULCOLAX) suppository 10 mg  10 mg Rectal Q12H PRN Michael Boston, MD      . ceFEPIme (MAXIPIME) 1 g in dextrose 5 % 50 mL IVPB  1 g Intravenous Tor Netters, MD 100 mL/hr at 02/20/17 0635 1 g  at 02/20/17 0635  . chlorproMAZINE (THORAZINE) 25 mg in sodium chloride 0.9 % 25 mL IVPB  25 mg Intravenous Q6H PRN Michael Boston, MD      . chlorthalidone (HYGROTON) tablet 25 mg  25 mg Oral Daily PRN Michael Boston, MD      . dexamethasone (DECADRON) injection  4 mg  4 mg Intravenous Gorden Harms, MD   4 mg at 02/19/17 2110  . dexamethasone (DECADRON) injection 8 mg  8 mg Intravenous Once Michael Boston, MD      . diphenhydrAMINE (BENADRYL) injection 12.5-25 mg  12.5-25 mg Intravenous Q6H PRN Michael Boston, MD      . enoxaparin (LOVENOX) injection 40 mg  40 mg Subcutaneous QHS Jackolyn Confer, MD   40 mg at 02/19/17 2109  . famotidine (PEPCID) tablet 20 mg  20 mg Oral BID Michael Boston, MD      . fluconazole (DIFLUCAN) tablet 200 mg  200 mg Oral Once Michael Boston, MD      . guaiFENesin-dextromethorphan (ROBITUSSIN DM) 100-10 MG/5ML syrup 10 mL  10 mL Oral Q4H PRN Michael Boston, MD      . hydrALAZINE (APRESOLINE) injection 5-10 mg  5-10 mg Intravenous Q4H PRN Michael Boston, MD      . hydrocortisone (ANUSOL-HC) 2.5 % rectal cream 1 application  1 application Topical QID PRN Michael Boston, MD      . hydrocortisone cream 1 % 1 application  1 application Topical TID PRN Michael Boston, MD      . HYDROmorphone (DILAUDID) injection 0.5-2 mg  0.5-2 mg Intravenous Q3H PRN Michael Boston, MD      . lip balm (CARMEX) ointment 1 application  1 application Topical BID Michael Boston, MD   1 application at 61/44/31 2111  . magic mouthwash  15 mL Oral QID PRN Michael Boston, MD      . menthol-cetylpyridinium (CEPACOL) lozenge 3 mg  1 lozenge Oral PRN Michael Boston, MD      . methocarbamol (ROBAXIN) 1,000 mg in dextrose 5 % 50 mL IVPB  1,000 mg Intravenous Q6H PRN Michael Boston, MD      . metoCLOPramide (REGLAN) injection 5-10 mg  5-10 mg Intravenous Q6H PRN Michael Boston, MD      . metoprolol tartrate (LOPRESSOR) injection 5 mg  5 mg Intravenous Q6H PRN Michael Boston, MD      . ondansetron [facility]) injection  4 mg  4 mg Intravenous Q6H PRN Michael Boston, MD   4 mg at 02/19/17 0159   Or  . ondansetron (ZOFRAN) 8 mg in sodium chloride 0.9 % 50 mL IVPB  8 mg Intravenous Q6H PRN Michael Boston, MD      . ondansetron (ZOFRAN-ODT) disintegrating tablet 4 mg  4 mg Oral Q6H PRN Rosenbower, Sherren Mocha, MD      . phenol (CHLORASEPTIC) mouth spray 1-2 spray  1-2 spray Mouth/Throat PRN Michael Boston, MD      . polyethylene glycol (MIRALAX / GLYCOLAX) packet 17 g  17 g Oral BID Michael Boston, MD   17 g at 02/19/17 1025  . prochlorperazine (COMPAZINE) injection 5-10 mg  5-10 mg Intravenous Q4H PRN Michael Boston, MD      . promethazine (PHENERGAN) suppository 12.5 mg  12.5 mg Rectal Q6H PRN Michael Boston, MD      . simethicone (MYLICON) 40 VQ/0.0QQ suspension 40 mg  40 mg Oral QID Michael Boston, MD   40 mg at 02/19/17 2109     No Known Allergies  Signed: Morton Peters, M.D., F.A.C.S. Gastrointestinal and Minimally Invasive Surgery [facility] [address], [place],  [address] [phone] Main / Paging   02/20/2017, 9:30 AM

## 2017-02-21 LAB — HIV ANTIBODY (ROUTINE TESTING W REFLEX): HIV SCREEN 4TH GENERATION: NONREACTIVE

## 2017-02-22 LAB — CULTURE, BLOOD (ROUTINE X 2)
Culture: NO GROWTH
Culture: NO GROWTH
SPECIAL REQUESTS: ADEQUATE
Special Requests: ADEQUATE

## 2017-03-26 DIAGNOSIS — I10 Essential (primary) hypertension: Secondary | ICD-10-CM | POA: Diagnosis not present

## 2017-03-26 DIAGNOSIS — Z0000 Encounter for general adult medical examination without abnormal findings: Secondary | ICD-10-CM | POA: Diagnosis not present

## 2017-03-26 DIAGNOSIS — E041 Nontoxic single thyroid nodule: Secondary | ICD-10-CM | POA: Diagnosis not present

## 2017-03-29 DIAGNOSIS — Z0001 Encounter for general adult medical examination with abnormal findings: Secondary | ICD-10-CM | POA: Diagnosis not present

## 2017-04-10 ENCOUNTER — Inpatient Hospital Stay (HOSPITAL_COMMUNITY): Payer: Medicare Other

## 2017-04-10 ENCOUNTER — Inpatient Hospital Stay (HOSPITAL_COMMUNITY)
Admission: AD | Admit: 2017-04-10 | Discharge: 2017-04-20 | DRG: 335 | Disposition: A | Discharge status: Home / Self Care | Payer: Medicare Other | Source: Ambulatory Visit | Attending: Surgery | Admitting: Surgery

## 2017-04-10 ENCOUNTER — Other Ambulatory Visit: Payer: Self-pay | Admitting: Surgery

## 2017-04-10 ENCOUNTER — Encounter (HOSPITAL_COMMUNITY): Payer: Self-pay | Admitting: *Deleted

## 2017-04-10 DIAGNOSIS — K660 Peritoneal adhesions (postprocedural) (postinfection): Secondary | ICD-10-CM | POA: Diagnosis not present

## 2017-04-10 DIAGNOSIS — K219 Gastro-esophageal reflux disease without esophagitis: Secondary | ICD-10-CM | POA: Diagnosis present

## 2017-04-10 DIAGNOSIS — E871 Hypo-osmolality and hyponatremia: Secondary | ICD-10-CM | POA: Diagnosis not present

## 2017-04-10 DIAGNOSIS — K760 Fatty (change of) liver, not elsewhere classified: Secondary | ICD-10-CM | POA: Diagnosis not present

## 2017-04-10 DIAGNOSIS — K440 Diaphragmatic hernia with obstruction, without gangrene: Secondary | ICD-10-CM | POA: Diagnosis not present

## 2017-04-10 DIAGNOSIS — Z79899 Other long term (current) drug therapy: Secondary | ICD-10-CM

## 2017-04-10 DIAGNOSIS — Z9884 Bariatric surgery status: Secondary | ICD-10-CM | POA: Diagnosis not present

## 2017-04-10 DIAGNOSIS — K223 Perforation of esophagus: Secondary | ICD-10-CM | POA: Diagnosis not present

## 2017-04-10 DIAGNOSIS — Y838 Other surgical procedures as the cause of abnormal reaction of the patient, or of later complication, without mention of misadventure at the time of the procedure: Secondary | ICD-10-CM | POA: Diagnosis present

## 2017-04-10 DIAGNOSIS — M1990 Unspecified osteoarthritis, unspecified site: Secondary | ICD-10-CM | POA: Diagnosis not present

## 2017-04-10 DIAGNOSIS — Z9842 Cataract extraction status, left eye: Secondary | ICD-10-CM

## 2017-04-10 DIAGNOSIS — K449 Diaphragmatic hernia without obstruction or gangrene: Secondary | ICD-10-CM | POA: Diagnosis not present

## 2017-04-10 DIAGNOSIS — K2289 Other specified disease of esophagus: Secondary | ICD-10-CM

## 2017-04-10 DIAGNOSIS — J90 Pleural effusion, not elsewhere classified: Secondary | ICD-10-CM | POA: Diagnosis not present

## 2017-04-10 DIAGNOSIS — Z9841 Cataract extraction status, right eye: Secondary | ICD-10-CM

## 2017-04-10 DIAGNOSIS — E441 Mild protein-calorie malnutrition: Secondary | ICD-10-CM

## 2017-04-10 DIAGNOSIS — K562 Volvulus: Secondary | ICD-10-CM | POA: Diagnosis not present

## 2017-04-10 DIAGNOSIS — R627 Adult failure to thrive: Secondary | ICD-10-CM | POA: Diagnosis not present

## 2017-04-10 DIAGNOSIS — T8189XA Other complications of procedures, not elsewhere classified, initial encounter: Secondary | ICD-10-CM | POA: Diagnosis present

## 2017-04-10 DIAGNOSIS — I10 Essential (primary) hypertension: Secondary | ICD-10-CM | POA: Diagnosis present

## 2017-04-10 DIAGNOSIS — E440 Moderate protein-calorie malnutrition: Secondary | ICD-10-CM | POA: Diagnosis not present

## 2017-04-10 DIAGNOSIS — R739 Hyperglycemia, unspecified: Secondary | ICD-10-CM | POA: Diagnosis present

## 2017-04-10 DIAGNOSIS — Z8249 Family history of ischemic heart disease and other diseases of the circulatory system: Secondary | ICD-10-CM | POA: Diagnosis not present

## 2017-04-10 DIAGNOSIS — K222 Esophageal obstruction: Secondary | ICD-10-CM | POA: Diagnosis not present

## 2017-04-10 DIAGNOSIS — K319 Disease of stomach and duodenum, unspecified: Secondary | ICD-10-CM | POA: Diagnosis not present

## 2017-04-10 DIAGNOSIS — R933 Abnormal findings on diagnostic imaging of other parts of digestive tract: Secondary | ICD-10-CM | POA: Diagnosis not present

## 2017-04-10 DIAGNOSIS — R6251 Failure to thrive (child): Secondary | ICD-10-CM | POA: Diagnosis present

## 2017-04-10 DIAGNOSIS — K567 Ileus, unspecified: Secondary | ICD-10-CM | POA: Diagnosis not present

## 2017-04-10 DIAGNOSIS — E876 Hypokalemia: Secondary | ICD-10-CM | POA: Diagnosis present

## 2017-04-10 DIAGNOSIS — R1310 Dysphagia, unspecified: Secondary | ICD-10-CM | POA: Diagnosis not present

## 2017-04-10 DIAGNOSIS — K228 Other specified diseases of esophagus: Secondary | ICD-10-CM

## 2017-04-10 DIAGNOSIS — E669 Obesity, unspecified: Secondary | ICD-10-CM | POA: Diagnosis present

## 2017-04-10 DIAGNOSIS — R0609 Other forms of dyspnea: Secondary | ICD-10-CM

## 2017-04-10 DIAGNOSIS — Z6830 Body mass index (BMI) 30.0-30.9, adult: Secondary | ICD-10-CM | POA: Diagnosis not present

## 2017-04-10 DIAGNOSIS — R112 Nausea with vomiting, unspecified: Secondary | ICD-10-CM | POA: Diagnosis present

## 2017-04-10 DIAGNOSIS — Z931 Gastrostomy status: Secondary | ICD-10-CM

## 2017-04-10 HISTORY — DX: Other diseases of stomach and duodenum: K31.89

## 2017-04-10 LAB — COMPREHENSIVE METABOLIC PANEL
ALK PHOS: 82 U/L (ref 38–126)
ALT: 14 U/L (ref 14–54)
ANION GAP: 15 (ref 5–15)
AST: 21 U/L (ref 15–41)
Albumin: 3.3 g/dL — ABNORMAL LOW (ref 3.5–5.0)
BILIRUBIN TOTAL: 1.5 mg/dL — AB (ref 0.3–1.2)
BUN: 13 mg/dL (ref 6–20)
CALCIUM: 9.5 mg/dL (ref 8.9–10.3)
CO2: 31 mmol/L (ref 22–32)
CREATININE: 0.6 mg/dL (ref 0.44–1.00)
Chloride: 93 mmol/L — ABNORMAL LOW (ref 101–111)
GFR calc Af Amer: >60 mL/min (ref >60)
GFR calc non Af Amer: >60 mL/min (ref >60)
GLUCOSE: 127 mg/dL — AB (ref 65–99)
Potassium: 2.7 mmol/L — CL (ref 3.5–5.1)
SODIUM: 139 mmol/L (ref 135–145)
TOTAL PROTEIN: 7.1 g/dL (ref 6.5–8.1)

## 2017-04-10 LAB — CBC
HEMATOCRIT: 41 % (ref 36.0–46.0)
HEMOGLOBIN: 14.1 g/dL (ref 12.0–15.0)
MCH: 27.2 pg (ref 26.0–34.0)
MCHC: 34.4 g/dL (ref 30.0–36.0)
MCV: 79.2 fL (ref 78.0–100.0)
Platelets: 418 10*3/uL — ABNORMAL HIGH (ref 150–400)
RBC: 5.18 MIL/uL — AB (ref 3.87–5.11)
RDW: 14.8 % (ref 11.5–15.5)
WBC: 10.5 10*3/uL (ref 4.0–10.5)

## 2017-04-10 MED ORDER — DEXAMETHASONE SODIUM PHOSPHATE 4 MG/ML IJ SOLN
4.0000 mg | Freq: Two times a day (BID) | INTRAMUSCULAR | Status: AC
Start: 2017-04-10 — End: 2017-04-13
  Administered 2017-04-10 – 2017-04-12 (×5): 4 mg via INTRAVENOUS
  Filled 2017-04-10 (×6): qty 1

## 2017-04-10 MED ORDER — LACTATED RINGERS IV BOLUS (SEPSIS)
1000.0000 mL | Freq: Three times a day (TID) | INTRAVENOUS | Status: AC | PRN
  Administered 2017-04-11: 1000 mL via INTRAVENOUS

## 2017-04-10 MED ORDER — ENOXAPARIN SODIUM 40 MG/0.4ML ~~LOC~~ SOLN
40.0000 mg | SUBCUTANEOUS | Status: DC
  Administered 2017-04-10 – 2017-04-11 (×2): 40 mg via SUBCUTANEOUS
  Filled 2017-04-10 (×2): qty 0.4

## 2017-04-10 MED ORDER — HYDROCORTISONE ACE-PRAMOXINE 2.5-1 % RE CREA: 1.0000 "application " | TOPICAL_CREAM | Freq: Four times a day (QID) | RECTAL | Status: DC | PRN

## 2017-04-10 MED ORDER — HYDRALAZINE HCL 20 MG/ML IJ SOLN: 5.0000 mg | INTRAMUSCULAR | Status: DC | PRN

## 2017-04-10 MED ORDER — LIP MEDEX EX OINT
1.0000 | TOPICAL_OINTMENT | Freq: Two times a day (BID) | CUTANEOUS | Status: DC
Start: 2017-04-10 — End: 2017-04-20
  Administered 2017-04-10 – 2017-04-19 (×18): 1 via TOPICAL
  Filled 2017-04-10 (×6): qty 7

## 2017-04-10 MED ORDER — ALUM & MAG HYDROXIDE-SIMETH 200-200-20 MG/5ML PO SUSP
30.0000 mL | Freq: Four times a day (QID) | ORAL | Status: DC | PRN
  Administered 2017-04-20: 30 mL via ORAL
  Filled 2017-04-10: qty 30

## 2017-04-10 MED ORDER — PHENOL 1.4 % MT LIQD: 1.0000 | OROMUCOSAL | Status: DC | PRN

## 2017-04-10 MED ORDER — ACETAMINOPHEN 650 MG RE SUPP: 650.0000 mg | Freq: Four times a day (QID) | RECTAL | Status: DC | PRN

## 2017-04-10 MED ORDER — SODIUM CHLORIDE 0.9 % IV SOLN
25.0000 mg | Freq: Four times a day (QID) | INTRAVENOUS | Status: DC | PRN
  Filled 2017-04-10: qty 1

## 2017-04-10 MED ORDER — ONDANSETRON HCL 4 MG/2ML IJ SOLN
4.0000 mg | Freq: Four times a day (QID) | INTRAMUSCULAR | Status: DC
  Administered 2017-04-10 – 2017-04-16 (×18): 4 mg via INTRAVENOUS
  Filled 2017-04-10 (×19): qty 2

## 2017-04-10 MED ORDER — HYDROCORTISONE 1 % EX CREA: 1.0000 "application " | TOPICAL_CREAM | Freq: Three times a day (TID) | CUTANEOUS | Status: DC | PRN

## 2017-04-10 MED ORDER — PROCHLORPERAZINE EDISYLATE 5 MG/ML IJ SOLN
5.0000 mg | INTRAMUSCULAR | Status: DC | PRN
  Administered 2017-04-15: 10 mg via INTRAVENOUS
  Filled 2017-04-10: qty 2

## 2017-04-10 MED ORDER — HYDROCORTISONE 2.5 % RE CREA: 1.0000 "application " | TOPICAL_CREAM | Freq: Four times a day (QID) | RECTAL | Status: DC | PRN

## 2017-04-10 MED ORDER — METOCLOPRAMIDE HCL 5 MG/ML IJ SOLN: 10.0000 mg | Freq: Four times a day (QID) | INTRAMUSCULAR | Status: DC | PRN

## 2017-04-10 MED ORDER — ACETAMINOPHEN 325 MG PO TABS: 650.0000 mg | ORAL_TABLET | Freq: Four times a day (QID) | ORAL | Status: DC | PRN

## 2017-04-10 MED ORDER — LACTATED RINGERS IV SOLN
INTRAVENOUS | Status: DC
  Administered 2017-04-10 – 2017-04-13 (×4): via INTRAVENOUS

## 2017-04-10 MED ORDER — MAGIC MOUTHWASH
15.0000 mL | Freq: Four times a day (QID) | ORAL | Status: DC | PRN
  Filled 2017-04-10: qty 15

## 2017-04-10 MED ORDER — PROCHLORPERAZINE EDISYLATE 5 MG/ML IJ SOLN: 5.0000 mg | INTRAMUSCULAR | Status: DC | PRN

## 2017-04-10 MED ORDER — ONDANSETRON HCL 4 MG/2ML IJ SOLN
4.0000 mg | Freq: Four times a day (QID) | INTRAMUSCULAR | Status: DC | PRN
  Administered 2017-04-15: 4 mg via INTRAVENOUS
  Filled 2017-04-10: qty 2

## 2017-04-10 MED ORDER — LACTATED RINGERS IV BOLUS (SEPSIS): 1000.0000 mL | Freq: Once | INTRAVENOUS | Status: AC

## 2017-04-10 MED ORDER — MENTHOL 3 MG MT LOZG: 1.0000 | LOZENGE | OROMUCOSAL | Status: DC | PRN

## 2017-04-10 MED ORDER — ONDANSETRON 4 MG PO TBDP: 4.0000 mg | ORAL_TABLET | Freq: Four times a day (QID) | ORAL | Status: DC | PRN

## 2017-04-10 MED ORDER — FENTANYL CITRATE (PF) 100 MCG/2ML IJ SOLN
25.0000 ug | INTRAMUSCULAR | Status: DC | PRN
Start: 2017-04-10 — End: 2017-04-20
  Administered 2017-04-13: 25 ug via INTRAVENOUS
  Administered 2017-04-14 (×5): 50 ug via INTRAVENOUS
  Filled 2017-04-10 (×6): qty 2

## 2017-04-10 MED ORDER — METHOCARBAMOL 1000 MG/10ML IJ SOLN
1000.0000 mg | Freq: Four times a day (QID) | INTRAVENOUS | Status: DC | PRN
  Filled 2017-04-10: qty 10

## 2017-04-10 MED ORDER — BISACODYL 10 MG RE SUPP: 10.0000 mg | Freq: Two times a day (BID) | RECTAL | Status: DC | PRN

## 2017-04-10 MED ORDER — DIPHENHYDRAMINE HCL 12.5 MG/5ML PO ELIX: 12.5000 mg | ORAL_SOLUTION | Freq: Four times a day (QID) | ORAL | Status: DC | PRN

## 2017-04-10 MED ORDER — SIMETHICONE 80 MG PO CHEW: 40.0000 mg | CHEWABLE_TABLET | Freq: Four times a day (QID) | ORAL | Status: DC | PRN

## 2017-04-10 MED ORDER — GUAIFENESIN-DM 100-10 MG/5ML PO SYRP: 10.0000 mL | ORAL_SOLUTION | ORAL | Status: DC | PRN

## 2017-04-10 MED ORDER — METOPROLOL TARTRATE 5 MG/5ML IV SOLN: 5.0000 mg | Freq: Four times a day (QID) | INTRAVENOUS | Status: DC | PRN

## 2017-04-10 MED ORDER — DIPHENHYDRAMINE HCL 50 MG/ML IJ SOLN: 12.5000 mg | Freq: Four times a day (QID) | INTRAMUSCULAR | Status: DC | PRN

## 2017-04-10 NOTE — Progress Notes (Signed)
Patient arrived on unit direct admit.  No family at bedside.

## 2017-04-10 NOTE — Progress Notes (Signed)
Pt gave permission to ask questions on nursing admission history while daughter, son in law, and husband were in room. Briscoe Burns BSN, RN-BC Admissions RN 04/10/2017 7:14 PM

## 2017-04-10 NOTE — H&P (Signed)
Sydney [name] Documented: 04/10/2017 11:23 AM Location: [facility] Patient #: [id number] DOB: 09-Feb-1946 Married / Language: [place] / Race: White Female  History of Present Illness [name] MD; 04/10/2017 12:16 PM) The patient is a 72 year old female who presents with nausea. Note for "Nausea": ` ` ` The patient returns s/p robotic paraesophageal incarcerated hiatal hernia repair and fundoplication.  Date of procedure: 01/31/2017  The returns to Tuesday clinic after surgery, struggling. She had a tight wrap then improved with IV steroids. She had recurrent dysphagia with swelling. Readmitted. Hydrated. IV steroids. Felt better. That was 6 weeks ago. I saw her 3 weeks ago. She was tolerating full liquids and some soft foods. I recommend she advance her diet.  2 weeks later, she felt like food was sticking. She felt constipated, but she took some laxatives. Then had some hemorrhoidal pain and swelling. Preparation H has helped. She had some diarrhea. However she's not been able to keep any solid foods down for several days. She called Friday, concerned. I recommended oral steroids and nausea meds and oral fluids. She did not get the oral steroid Decadron filled. She has not been able to keep much down this weekend. Spitting up even liquids and water. She feels like she is losing weight. She is urinating but she is feeling tired. She denies any crampy abdominal pain. She denies any shortness of breath or difficulty breathing.   ` ` ` 01/31/2017  12:36 PM  PATIENT: Sydney [name] 72 y.o. female  Patient Care Team: Selinda Flavin, MD as PCP - General (Family Medicine) Karie Soda, MD as Consulting Physician (General Surgery) Danis, Andreas Blower, MD as Consulting Physician (Gastroenterology)  PRE-OPERATIVE DIAGNOSIS: PARAESOPHAGEAL HIATAL HERNIA WITH ORGANOAXIAL VOLVULUS, REFRACTORY TO MEDICAL MANAGEMENT  POST-OPERATIVE DIAGNOSIS:  PARAESOPHAGEAL HIATAL HERNIA WITH ORGANOAXIAL VOLVULUS, REFRACTORY TO MEDICAL MANAGEMENT  PROCEDURE:   1. Robotic Laparoscopic reduction of paraesophageal hiatal hernia 2. Type II mediastinal dissection. 3. Primary repair of hiatal hernia over pledgets. 3.5 Mesh reinforcement (Phasix) of hiatal hernia repair 4. Anterior & posterior gastropexy. 5. Nissen fundoplication 2 cm over a 56-French bougie  SURGEON: [name], MD  ASSIST: Marin Olp, MD  ANESTHESIA: local and general  EBL: Total I/O In: 1000 [I.V.:1000] Out: 50 [Blood:50]  Delay start of Pharmacological VTE agent (>24hrs) due to surgical blood loss or risk of bleeding: no  ANESTHESIA: 1. General anesthesia. 2. Local anesthetic in a field block around all port sites.  SPECIMEN: Mediastinal hernia sac (not sent).  DRAINS: A 19-French Blake drain goes from the right upper quadrant along the lesser curvature of the stomach into the mediastinum.  COUNTS: YES  PLAN OF CARE: Admit to inpatient  PATIENT DISPOSITION: PACU - hemodynamically stable.  INDICATION:  Patient with symptomatic paraesophageal hiatal hernia. Organoaxial volvulus rotation. No delayed gastric emptying. Manometry with no atypical peristalsis. The patient has had extensive work-up & we feel the patient will benefit from repair:  The anatomy & physiology of the foregut and anti-reflux mechanism was discussed. The pathophysiology of hiatal herniation and GERD was discussed. Natural history risks without surgery was discussed. The patient's symptoms are not adequately controlled by medicines and other non-operative treatments. I feel the risks of no intervention will lead to serious problems that outweigh the operative risks; therefore, I recommended surgery to reduce the hiatal hernia out of the chest and fundoplication to rebuild the anti-reflux valve and control reflux better. Need for a thorough workup to rule out the  differential diagnosis and plan treatment was explained. I explained laparoscopic techniques with possible need for an open approach.  Risks such as bleeding, infection, abscess, leak, need for further treatment, heart attack, death, and other risks were discussed. I noted a good likelihood this will help address the problem. Goals of post-operative recovery were discussed as well. Possibility that this will not correct all symptoms was explained. Post-operative dysphagia, need for short-term liquid & pureed diet, inability to vomit, possibility of reherniation, possible need for medicines to help control symptoms in addition to surgery were discussed. We will work to minimize complications. Educational handouts further explaining the pathology, treatment options, and dysphagia diet was given as well. Questions were answered. The patient expresses understanding & wishes to proceed with surgery.  OR FINDINGS:  Moderate-sized paraesophageal hiatal hernia with 100% of the stomach and duodenal bulb in the mediastinum. There was a 10 x 10 cm hiatal defect.  It is a primary repair over pledgets.  Mesh was used to reinforce the primary repair over pledgets. 15 x 10 cm Phasix Mesh (a knitted monofilament mesh scaffold using Poly-4-hydroxybutyrate (P4HB), a biologically derived, fully resorbable material)  The patient has a 2 cm Nissen fundoplication that was done over 56-French bougie. The patient has had anterior and posterior gastropexies.  DESCRIPTION:  Informed consent was confirmed. The patient received IV antibiotics prior to incision. The underwent general anesthesia without difficulty. A Foley catheter sterilely placed. The patient was positioned in split leg with arms tucked. The abdomen was prepped and draped in the sterile fashion. Surgical time-out confirmed our plan.  I induced capnoperitoneum using a Varess technique. Placed to a millimeter port in the anterior axillary  line in the left subcostal region Camera inspection revealed no injury. Under direct visualization, I placed additional ports in the abdomen including assist port in the right lower quadrant. I also placed a 5 mm port in the left subxiphoid region under direct visualization. I removed that and placed an Omega-shaped rigid Nathanson liver retractor to lift the left lateral sector of the liver anteriorly to expose the esophageal hiatus. Fortunately it was relatively small. This was secured to the bed using the iron man system. The Xi robot was carefully docked and positioned.  I confirmed a very large hiatal hernia with all the stomach and the duodenal bulb incarcerated up in the mediastinum. It would not spontaneously reduced down. Therefore decided to dissect the hernia sac off the mediastinum to help reduce the contents. I started in the right anterior crus to grasp the anterior mediastinal sac at the apex of the crus. I scored through that and got into the anterior mediastinum. I was able to free the mediastinal sac from its attachments to the pericardium and bilateral pleura using primarily focused gentle blunt dissection as well as focused ultrasonic Harmonic dissection. I transected phrenoesophageal attachments to the inner right crus, preserving a two centimeter cuff of mediastinal sac until I found the base of the crura. I then came around anteriorly on the left side and freed up the phrenoesophageal attachments of the mediastinal sac on the medial part of the left crus on the superior half. I did careful mediastinal dissection to free the mediastinal sac. It was quite large especially in the right mediastinum. With that, we could relieve the suction cup affect of the hernia sac and help reduce the stomach back down into the abdomen, flipped back approriately.   We ligated the short gastrics along the lesser curvature of the stomach about  a third way down and then came up proximally  over the fundus. We released the attachments of the stomach to the retroperitoneum until we were able to connect with the prior dissection on the left crus. We completed the release of phrenoesophageal attachments to the medial part of the left crus down to its base. With this, we had circumferential mobilization. I had to do further dissection to ultimately free off adhesions of omentum and thick hernia sac at the base of both crura. I did release the liver attachments to the anterior diaphragm and right crus to help release some tension. Also did a relaxing incision on the left diaphragm parallel to the left crus. This helped the hiatus seem to come together which much less tension. I did have breech of the thinned out left diaphragm into the left pleural cavity with a clean opening. This was later closed with a horizontal mattress suture & patched / covered by the mesh (see below).  We placed the stomach and esophagus on axial tension. I then did a Type II mediastinal dissection where I freed the esophagus from its attachments to the aorta, spine, pleura, and pericardium using primarily gentle blunt as well as focused bipolar vessel sealer dissection. We saw the anterior & posterior vagus nerves worked to keep them intact. I procedded to dissect about 25 cm proximally into the mediastinum. Came up close to the azygos vein definitely in the mid mediastinum. With that eventually I could free out the esophagus and get much more length. With that I could straighten out the esophagus and get 4 cm of intra-abdominal length of the esophagus at a best estimation.  I freed the anterior mediastinal sac off the esophagus & stomach. Carefully skeletonized. We saw the anterior vagus nerve and freed the sac off of the vagus. Greater omentum was rather adherent to the posterior hernia sac, so this took some time to carefully free off. I dissected out & removed the fatty epiphrenic pads at the  esophagogastric junction. With that, I could better define the esophagogastric junction. I confirmed the the patient had 4 cm of intra-abdominal esophageal length off tension. After inspection the anterior vagus nerves was then brought intact. Posterior vagus was more wispy in the mid mediastinum but again intact.  I brought the fundus of the stomach posterior to the esophagus over to the right side. The wrap was mobile with the classic shoeshine maneuver. Wrap became together gently. We reflected the stomach left laterally and closed the esophageal hiatus using 0 Ethibond stitch using horizontal mattress stitches with pledgets on both sides. I did that x3 stitches. The crura had good substance and they came together well without any tension.  He also did a reinforcement mesh repair using Phasix mesh. 15 x 10 cm sheet. Cut a conical defect to form a broad U. 5 cm right, 10 cm left side. I rolled that mesh in. I positioned it on top of the crural closure. The more narrow tail overlying the right crura. The broader tail between the diaphragm and spleen. Cured that to the diaphragm using interrupted Ethibond sutures. I closed the small left diaphragmatic releasing incision defect with a 0 Ethibond horizontal mattress suture and out the mesh to help reinforce the closure. I made sure that there was a gap anteriorly to avoid circumferential tightening or stricturing.  I reset up the and Nissen wrap and did a posterior gastropexy by taking of 0 Ethibond stitches x 3 to the posterior part of the right  side of the wrap and thru the crural closure and tied that down for good posterior gastropexy up against the hiatal closure. I then did a left anterior gastropexy taking the apex of the left side of the wrap to the left anterior crura just posterior to the phrenic vein. In a similar suture in a mirror image fashion on the right side for the right posterior part of the stomach reactive to the anterior  right corona. I left the stitches untied.  Next, Anesthesia passed a 56-French bougie transorally. It was a lighted bougie and we did do this under axial tension. It passed down easily without resistance. I tied the anterior gastropexy sutures down. Because she had normal esophageal motility by manometry, I decided to have a good antireflux valve reconstruction with a classic 2 cm Nissen fundoplication on the true esophagus above the cardia using Ethibond stitch in the left side of the wrap, then anterior esophagus, and then right side of the wrap and tied that down. Did 3 stitches. I measured it and it was 2 cm in length. We removed the bougie. It was intact.   The wrap was soft and floppy. I placed a drain as noted above. I did irrigation and ensured hemostasis. I saw no evidence of any leak or perforation or other abnormality. I removed the [facility] liver retractor under direct visualization. I evacuated carbon dioxide and removed the ports. Counts were correct on the needles, sponges, and instruments. The skin was closed with Monocryl and sterile dressings applied. The largest port was along the left subcostal region and slid up above the rib cage, so I did not feel it needed more aggressive closure.  The patient is being extubated and brought back to the recovery room. I discussed postop care in detail with the patient and family in in the office. Discussed again with the patient in the holding area. I discussed operative findings, updated the patient's status, discussed probable steps to recovery, and gave postoperative recommendations to the patient's family. Recommendations were made. Questions were answered. They expressed understanding & appreciation.  [name], M.D., F.A.C.S. Gastrointestinal and Minimally Invasive Surgery [facility] [address] [place], [place] [address] [phone] Main / Paging   Allergies (Tanisha  A. Manson Passey, RMA; 04/10/2017 11:23 AM) No Known Drug Allergies [12/06/2016]: Allergies Reconciled  Medication History (Tanisha A. Manson Passey, RMA; 04/10/2017 11:23 AM) Chlorthalidone (25MG  Tablet, Oral) Active. RaNITidine HCl (300MG  Tablet, Oral) Active. Medications Reconciled    Vitals (Tanisha A. Brown RMA; 04/10/2017 11:23 AM) 04/10/2017 11:23 AM Weight: 128.2 lb Height: 60in Body Surface Area: 1.55 m Body Mass Index: 25.04 kg/m  Temp.: 98.41F  Pulse: 102 (Regular)  BP: 136/86 (Sitting, Left Arm, Standard)      Physical Exam 04/12/2017 MD; 04/10/2017 12:09 PM)  General Mental Status-Alert. General Appearance-Not in acute distress. Voice-Normal. Note: Relaxed. Nontoxic.  Integumentary Global Assessment Upon inspection and palpation of skin surfaces of the - Distribution of scalp and body hair is normal. General Characteristics Overall examination of the patient's skin reveals - no rashes and no suspicious lesions.  Head and Neck Head-normocephalic, atraumatic with no lesions or palpable masses. Face Global Assessment - atraumatic, no absence of expression. Neck Global Assessment - no abnormal movements, no decreased range of motion. Trachea-midline. Thyroid Gland Characteristics - non-tender.  Eye Eyeball - Left-Extraocular movements intact, No Nystagmus. Eyeball - Right-Extraocular movements intact, No Nystagmus. Upper Eyelid - Left-No Cyanotic. Upper Eyelid - Right-No Cyanotic.  Chest and Lung Exam Inspection Accessory muscles - No use of accessory muscles in breathing.  Abdomen Note: Abdomen soft and flat. Nontender. Nondistended. Incisions with normal healing ridges. No active bleeding. No cellulitis. No guarding/rebound tenderness  Peripheral Vascular Upper Extremity Inspection - Left - Not Gangrenous, No Petechiae. Right - Not Gangrenous, No Petechiae.  Neurologic Neurologic evaluation reveals -normal  attention span and ability to concentrate, able to name objects and repeat phrases. Appropriate fund of knowledge and normal coordination.  Neuropsychiatric Mental status exam performed with findings of-able to articulate well with normal speech/language, rate, volume and coherence and no evidence of hallucinations, delusions, obsessions or homicidal/suicidal ideation. Orientation-oriented X3.  Musculoskeletal Global Assessment Gait and Station - normal gait and station.  Lymphatic General Lymphatics Description - No Generalized lymphadenopathy.    Assessment & Plan [name] MD; 04/10/2017 12:08 PM)  NAUSEA AND VOMITING IN ADULT (R11.2) Impression: Current nausea vomiting of uncertain etiology.  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 May benefit from balloon dilatation.  Admit.  IV fluids.  IV Decadron steroids.  Esophagram to evaluate anatomy. Make sure she does not have a recurrent hiatal hernia  If she has evidence of a tight strictured areas and may consider balloon dilatation. It is been more than 2 months. Usually some balloon dilation and Kenalog injections can help this resolve. Like to hold off on reoperative fundoplication if possible.  Current Plans Pt Education - [facility] Free Text Education/Instructions: discussed with patient and provided information. Follow up with [place] in the office in 2 weeks.  Call us sooner as needed.  Follow up if no improvement or if symptoms worsen  [name], M.D., F.A.C.S. Gastrointestinal and Minimally Invasive Surgery [facility] [address] [place], [place] [address] [phone] Main / Paging

## 2017-04-11 ENCOUNTER — Inpatient Hospital Stay (HOSPITAL_COMMUNITY): Payer: Medicare Other

## 2017-04-11 ENCOUNTER — Encounter (HOSPITAL_COMMUNITY): Payer: Self-pay | Admitting: Surgery

## 2017-04-11 DIAGNOSIS — E441 Mild protein-calorie malnutrition: Secondary | ICD-10-CM

## 2017-04-11 MED ORDER — POTASSIUM CHLORIDE 10 MEQ/100ML IV SOLN
10.0000 meq | INTRAVENOUS | Status: AC
  Filled 2017-04-11 (×8): qty 100

## 2017-04-11 MED ORDER — PIPERACILLIN-TAZOBACTAM 3.375 G IVPB
3.3750 g | Freq: Three times a day (TID) | INTRAVENOUS | Status: AC
  Administered 2017-04-11 – 2017-04-15 (×9): 3.375 g via INTRAVENOUS
  Filled 2017-04-11 (×9): qty 50

## 2017-04-11 MED ORDER — ORAL CARE MOUTH RINSE
15.0000 mL | Freq: Two times a day (BID) | OROMUCOSAL | Status: DC
  Administered 2017-04-11 – 2017-04-18 (×7): 15 mL via OROMUCOSAL

## 2017-04-11 MED ORDER — SIMETHICONE 40 MG/0.6ML PO SUSP
40.0000 mg | Freq: Four times a day (QID) | ORAL | Status: DC
  Administered 2017-04-11 – 2017-04-13 (×5): 40 mg via ORAL
  Filled 2017-04-11 (×13): qty 0.6

## 2017-04-11 MED ORDER — IOPAMIDOL (ISOVUE-300) INJECTION 61%
100.0000 mL | Freq: Once | INTRAVENOUS | Status: AC | PRN
  Administered 2017-04-11: 100 mL via INTRAVENOUS

## 2017-04-11 MED ORDER — CHLORHEXIDINE GLUCONATE 0.12 % MT SOLN
15.0000 mL | Freq: Two times a day (BID) | OROMUCOSAL | Status: DC
  Administered 2017-04-11 – 2017-04-17 (×12): 15 mL via OROMUCOSAL
  Filled 2017-04-11 (×12): qty 15

## 2017-04-11 MED ORDER — IOPAMIDOL (ISOVUE-300) INJECTION 61%
INTRAVENOUS | Status: AC
  Filled 2017-04-11: qty 100

## 2017-04-11 MED ORDER — IOPAMIDOL (ISOVUE-300) INJECTION 61%
INTRAVENOUS | Status: AC
  Administered 2017-04-11: 10 mL via ORAL
  Filled 2017-04-11: qty 150

## 2017-04-11 MED ORDER — FAMOTIDINE IN NACL 20-0.9 MG/50ML-% IV SOLN
20.0000 mg | Freq: Two times a day (BID) | INTRAVENOUS | Status: DC
Start: 2017-04-11 — End: 2017-04-14
  Administered 2017-04-11 – 2017-04-13 (×5): 20 mg via INTRAVENOUS
  Filled 2017-04-11 (×8): qty 50

## 2017-04-11 MED ORDER — BOOST / RESOURCE BREEZE PO LIQD CUSTOM
1.0000 | Freq: Two times a day (BID) | ORAL | Status: DC
  Administered 2017-04-11: 1 via ORAL

## 2017-04-11 MED ORDER — POTASSIUM CHLORIDE 20 MEQ/15ML (10%) PO SOLN
60.0000 meq | Freq: Once | ORAL | Status: DC
  Filled 2017-04-11: qty 45

## 2017-04-11 MED ORDER — IOPAMIDOL (ISOVUE-300) INJECTION 61%
INTRAVENOUS | Status: AC
  Administered 2017-04-11: 30 mL via ORAL
  Filled 2017-04-11: qty 30

## 2017-04-11 NOTE — Progress Notes (Signed)
CRITICAL VALUE ALERT  Critical Value:  K+   2.7  Date & Time Notied:  [id number]  2300  Provider Notified: Yes Orders Received/Actions taken: Yes

## 2017-04-11 NOTE — Care Management Note (Signed)
Case Management Note  Patient Details  Name: Sydney [name] MRN: [id number] Date of Birth: 1946/01/11  Subjective/Objective:                  S/p abd surg, nausea and vomiting at home  Action/Plan: Date: April 11, 2017 Marcelle Smiling, BSN, [place], [place] [phone] Chart and notes review for patient progress and needs. Will follow for case management and discharge needs. No cm or discharge needs present at time of this review. Next review date: [id number]  Expected Discharge Date:  (unknown)               Expected Discharge Plan:  Home/Self Care  In-House Referral:     Discharge planning Services  CM Consult  Post Acute Care Choice:    Choice offered to:     DME Arranged:    DME Agency:     HH Arranged:    HH Agency:     Status of Service:  In process, will continue to follow  If discussed at Long Length of Stay Meetings, dates discussed:    Additional Comments:  Golda Acre, RN 04/11/2017, 8:54 AM

## 2017-04-11 NOTE — Progress Notes (Signed)
Critical lab value K+  2.7.  [facility] Attending notified, new orders for oral solution and 4 runs IV. Pt unable to tolerate oral solution so attending increased IV bags. Will continue to monitor.

## 2017-04-11 NOTE — Progress Notes (Signed)
PHARMACY NOTE -  Zosyn  Pharmacy has been assisting with dosing of Zosyn for IAI. Zosyn 3.375gm IV q8h to be infused over 4hrs has been ordered.  Pt has good renal function.  Need for further dosage adjustment appears unlikely at present.    Will sign off at this time.  Please reconsult if a change in clinical status warrants re-evaluation of dosage.  Junita Push, PharmD, BCPS 04/11/2017@5 :51 PM

## 2017-04-11 NOTE — Progress Notes (Signed)
Note: Portions of this report may have been transcribed using voice recognition software. Every effort was made to ensure accuracy; however, inadvertent computerized transcription errors may be present.   Any transcriptional errors that result from this process are unintentional.             Sydney [name]  12-Feb-1946 [id number]  Patient Care Team: Selinda Flavin, MD as PCP - General (Family Medicine) Karie Soda, MD as Consulting Physician (General Surgery) Danis, Andreas Blower, MD as Consulting Physician (Gastroenterology)  Called by Dr Jena Gauss.  radiology with esophagram.  Very suspicious for leak.  Extravasation into a contained pocket.  I ordered CT of the chest/abd.  Looks like this is infradiaphragmatic at the level of the Nissen fundoplication wrap..  Fluid tracks posteriorly.  To my view, looks like there is a leak at the level of the fundoplication.  It would seem unlikely to happen posteriorly.  Most likely it is anteriorly trickling posteriorly.  Looks like this is an contained inflamed pocket.  I wonder if really she is leaking into the wrap and a stricture down.  Nonetheless, she is not extravasating into the pleural cavities door into the chest.  She is not frankly extravasating into the peritoneal cavity.  It seems contained.  Patient not toxic.   NPO  Will start antibiotics.  Discussed my partner, Dr. Dwain Sarna.  Will discuss with gastroenterology.  I feel like because she does not have any evidence of mediastinitis or decline, would try to manage this with stenting & time.  See if we can get across it with a Polyflex or covered stent to give time for the stricture to close on its own.  Will discuss with gastroenterology and see if can do combined case.  If that fails, may need to do reoperation & patch the leak.  Not any ideal time with all the adhesions plus her being malnourished and deconditioned.  Final option would be esophagectomy.  This would not be an ideal time to go  back in with adhesions.  Risk of greater tears or other problems higher.  Risk of repairs breaking down worse than the time of surgery.  She is not toxic requiring emergency intervention tonight, but will work on a more definitive plan this admission.  Patient Active Problem List   Diagnosis Date Noted  . 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 Malnutrition of moderate degree 04/11/2017  . Nausea & vomiting 04/10/2017  . Esophageal obstruction - partial 02/17/2017  . Pleural effusion, left 02/17/2017  . Hypokalemia 02/17/2017  . Failure to thrive (0-17) 02/16/2017  . Obesity   . Hypertension   . Gastroesophageal reflux disease   . BPPV (benign paroxysmal positional vertigo), left 10/30/2016    Past Medical History:  Diagnosis Date  . Arthritis   . Hiatal hernia with obstruction but no gangrene   . Hypertension   . Obesity   . 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 Seasonal allergies   . Thyroid nodule   . Vertigo     Past Surgical History:  Procedure Laterality Date  . CATARACT EXTRACTION, BILATERAL  2016  . CHOLECYSTECTOMY    . ESOPHAGEAL MANOMETRY N/A 01/03/2017   Procedure: ESOPHAGEAL MANOMETRY (EM);  Surgeon: Napoleon Form, MD;  Location: [facility];  Service: Endoscopy;  Laterality: N/A;  . INSERTION OF MESH N/A 01/31/2017   Procedure: INSERTION OF MESH;  Surgeon: Karie Soda, MD;  Location: [facility];  Service: General;  Laterality: N/A;  . MOUTH SURGERY    . TUBAL LIGATION      Social History   Socioeconomic History  . Marital status: Married    Spouse name: Not on file  . Number of children: 2  . Years of education: Not on file  . Highest education level: Not on file  Social Needs  . Financial resource strain: Not on file  . Food insecurity - worry: Not on file  . Food insecurity - inability: Not on file  . Transportation needs - medical: Not on file  . Transportation needs - non-medical: Not on  file  Occupational History  . Occupation: retired  Tobacco Use  . Smoking status: Never Smoker  . Smokeless tobacco: Never Used  Substance and Sexual Activity  . Alcohol use: No  . Drug use: No  . Sexual activity: Not on file  Other Topics Concern  . Not on file  Social History Narrative   4 grandsons, 5 great grands    Family History  Problem Relation Age of Onset  . Hypertension Mother   . Macular degeneration Mother        legally blind  . Heart Problems Mother        skips a beat  . Heart attack Father   . Leukemia Father   . Colon cancer Neg Hx   . Rectal cancer Neg Hx   . Throat cancer Neg Hx   . Esophageal cancer Neg Hx   . Stomach cancer Neg Hx     Current Facility-Administered Medications  Medication Dose Route Frequency Provider Last Rate Last Dose  . acetaminophen (TYLENOL) tablet 650 mg  650 mg Oral Q6H PRN Karie Soda, MD       Or  . acetaminophen (TYLENOL) suppository 650 mg  650 mg Rectal Q6H PRN Karie Soda, MD      . alum & mag hydroxide-simeth (MAALOX/MYLANTA) 200-200-20 MG/5ML suspension 30 mL  30 mL Oral Q6H PRN Karie Soda, MD      . bisacodyl (DULCOLAX) suppository 10 mg  10 mg Rectal Q12H PRN Karie Soda, MD      . chlorhexidine (PERIDEX) 0.12 % solution 15 mL  15 mL Mouth Rinse BID Karie Soda, MD   15 mL at 04/11/17 0952  . chlorproMAZINE (THORAZINE) 25 mg in sodium chloride 0.9 % 25 mL IVPB  25 mg Intravenous Q6H PRN Karie Soda, MD      . dexamethasone (DECADRON) injection 4 mg  4 mg Intravenous Catha Gosselin, MD   4 mg at 04/11/17 1000  . diphenhydrAMINE (BENADRYL) 12.5 MG/5ML elixir 12.5 mg  12.5 mg Oral Q6H PRN Karie Soda, MD       Or  . diphenhydrAMINE (BENADRYL) injection 12.5 mg  12.5 mg Intravenous Q6H PRN Karie Soda, MD      . enoxaparin (LOVENOX) injection 40 mg  40 mg Subcutaneous Q24H Karie Soda, MD   40 mg at 04/10/17 2251  . famotidine (PEPCID) IVPB 20 mg premix  20 mg Intravenous Catha Gosselin, MD    Stopped at 04/11/17 1030  . feeding supplement (BOOST / RESOURCE BREEZE) liquid 1 Container  1 Container Oral BID BM Karie Soda, MD   1 Container at 04/11/17 1517  . fentaNYL (SUBLIMAZE) injection 25-50 mcg  25-50 mcg Intravenous Q1H PRN Karie Soda, MD      . guaiFENesin-dextromethorphan (ROBITUSSIN DM) 100-10 MG/5ML syrup 10 mL  10 mL Oral Q4H PRN Karie Soda, MD      .  hydrALAZINE (APRESOLINE) injection 5-10 mg  5-10 mg Intravenous Q4H PRN Karie Soda, MD      . hydrocortisone (ANUSOL-HC) 2.5 % rectal cream 1 application  1 application Topical QID PRN Karie Soda, MD      . hydrocortisone cream 1 % 1 application  1 application Topical TID PRN Karie Soda, MD      . hydrocortisone-pramoxine [facility]) 2.5-1 % rectal cream 1 application  1 application Rectal Q6H PRN Karie Soda, MD      . iopamidol (ISOVUE-300) 61 % injection           . iopamidol (ISOVUE-300) 61 % injection           . lactated ringers bolus 1,000 mL  1,000 mL Intravenous Once Karie Soda, MD      . lactated ringers bolus 1,000 mL  1,000 mL Intravenous Q8H PRN Karie Soda, MD      . lactated ringers infusion   Intravenous Continuous Karie Soda, MD 125 mL/hr at 04/11/17 [phone]    . lip balm (CARMEX) ointment 1 application  1 application Topical BID Karie Soda, MD   1 application at 04/11/17 1033  . magic mouthwash  15 mL Oral QID PRN Karie Soda, MD      . MEDLINE mouth rinse  15 mL Mouth Rinse q12n4p Karie Soda, MD   15 mL at 04/11/17 1200  . menthol-cetylpyridinium (CEPACOL) lozenge 3 mg  1 lozenge Oral PRN Karie Soda, MD      . methocarbamol (ROBAXIN) 1,000 mg in dextrose 5 % 50 mL IVPB  1,000 mg Intravenous Q6H PRN Karie Soda, MD      . metoCLOPramide (REGLAN) injection 10 mg  10 mg Intravenous Q6H PRN Karie Soda, MD      . metoprolol tartrate (LOPRESSOR) injection 5 mg  5 mg Intravenous Q6H PRN Karie Soda, MD      . ondansetron (ZOFRAN-ODT) disintegrating tablet 4 mg  4 mg Oral Q6H  PRN Karie Soda, MD       Or  . ondansetron (ZOFRAN) injection 4 mg  4 mg Intravenous Q6H PRN Karie Soda, MD      . ondansetron [facility]) injection 4 mg  4 mg Intravenous Trecia Rogers, MD   4 mg at 04/11/17 1318  . phenol (CHLORASEPTIC) mouth spray 1-2 spray  1-2 spray Mouth/Throat PRN Karie Soda, MD      . piperacillin-tazobactam (ZOSYN) IVPB 3.375 g  3.375 g Intravenous Q8H Phylliss Blakes, RPH      . potassium chloride 20 MEQ/15ML (10%) solution 60 mEq  60 mEq Oral Once Almond Lint, MD      . prochlorperazine (COMPAZINE) injection 5-10 mg  5-10 mg Intravenous Q4H PRN Karie Soda, MD      . simethicone (MYLICON) 40 MG/0.6ML suspension 40 mg  40 mg Oral QID Karie Soda, MD   40 mg at 04/11/17 1324  . simethicone (MYLICON) chewable tablet 40 mg  40 mg Oral Q6H PRN Karie Soda, MD         No Known Allergies  BP 130/63 (BP Location: Left Arm)   Pulse 74   Temp 98.2 F (36.8 C) (Oral)   Resp 18   Ht 4\' 10"  (1.473 m)   Wt 60.1 kg (132 lb 7.9 oz)   SpO2 97%   BMI 27.69 kg/m   Ct Chest W Contrast  Result Date: 04/11/2017 CLINICAL DATA:  Status post hiatal hernia repair with anastomotic breakdown. EXAM: CT CHEST AND ABDOMEN WITH CONTRAST TECHNIQUE:  Multidetector CT imaging of the chest and abdomen was performed following the standard protocol during bolus administration of intravenous contrast. CONTRAST:  10mL ISOVUE-300 IOPAMIDOL (ISOVUE-300) INJECTION 61%, ISOVUE-300 IOPAMIDOL (ISOVUE-300) INJECTION 61% COMPARISON:  Swallow study from earlier today. FINDINGS: CT CHEST FINDINGS Cardiovascular: Normal heart size. No pericardial effusion. Mild aortic atherosclerosis. Mediastinum/Nodes: The trachea appears patent and is midline. Dilated and contrast esophagus containing identified. Postoperative changes from hiatal hernia repair are identified at the level of the GE junction. There is surrounding fluid, inflammation and extraluminal enteric contrast material and gas  identified. Although most of the enteric contrast material passes into the gastric fundus the extravasated contrast material from swallow study can be seen within the surrounding soft tissues just below the diaphragmatic hiatus, image 98 of series 6 and image 45 of series 2. Lungs/Pleura: A small left pleural effusion is identified. Subsegmental atelectasis noted within the medial right lower lobe. Musculoskeletal: No suspicious bone lesions. CT ABDOMEN FINDINGS Hepatobiliary: Mild hepatic steatosis. Focal area of low attenuation within the medial segment of left lobe of liver adjacent to the falciform ligament measures 4 cm, image 17 of series 7. Previous cholecystectomy. No biliary dilatation. Pancreas: Unremarkable. No pancreatic ductal dilatation or surrounding inflammatory changes. Spleen: Normal in size without focal abnormality. Adrenals/Urinary Tract: The adrenal glands appear normal. Unremarkable appearance of both kidneys. Stomach/Bowel: Postsurgical changes from previous hiatal hernia repair noted. Just below the diaphragmatic hiatus there is evidence of posterior extravasation of contrast material and extraluminal gas with surrounding inflammatory changes. The visualized upper abdominal bowel loops are nondilated. Vascular/Lymphatic: Aortic atherosclerosis. No aneurysm. No upper abdominal adenopathy. Other: None Musculoskeletal: Scoliosis and degenerative disc disease within the lumbar spine. IMPRESSION: 1. Examination again demonstrates extravasation of contrast material just below the level of the diaphragmatic hiatus from the Nissen fundoplication site compatible with breakdown of hiatal hernia repair. On the sagittal images the extravasation appears to extend posteriorly. On the coronal images there is lateral and medial extravasation of contrast. Emergent results were called by telephone at the time of interpretation on 04/11/2017 at 5:40 pm to Dr. Karie Soda , who verbally acknowledged these  results. 2. Hepatic steatosis. Focal low-attenuation area within the medial left lobe of liver measures 4 mm. Indeterminate but favored to represent an area of focal fatty deposition. Suggest followup imaging with liver protocol MRI to confirm. 3. Small left effusion. 4.  Aortic Atherosclerosis (ICD10-I70.0). Electronically Signed   By: Signa Kell M.D.   On: 04/11/2017 17:42   Ct Abdomen W Contrast  Result Date: 04/11/2017 CLINICAL DATA:  Status post hiatal hernia repair with anastomotic breakdown. EXAM: CT CHEST AND ABDOMEN WITH CONTRAST TECHNIQUE: Multidetector CT imaging of the chest and abdomen was performed following the standard protocol during bolus administration of intravenous contrast. CONTRAST:  10mL ISOVUE-300 IOPAMIDOL (ISOVUE-300) INJECTION 61%, ISOVUE-300 IOPAMIDOL (ISOVUE-300) INJECTION 61% COMPARISON:  Swallow study from earlier today. FINDINGS: CT CHEST FINDINGS Cardiovascular: Normal heart size. No pericardial effusion. Mild aortic atherosclerosis. Mediastinum/Nodes: The trachea appears patent and is midline. Dilated and contrast esophagus containing identified. Postoperative changes from hiatal hernia repair are identified at the level of the GE junction. There is surrounding fluid, inflammation and extraluminal enteric contrast material and gas identified. Although most of the enteric contrast material passes into the gastric fundus the extravasated contrast material from swallow study can be seen within the surrounding soft tissues just below the diaphragmatic hiatus, image 98 of series 6 and image 45 of series 2. Lungs/Pleura: A small left  pleural effusion is identified. Subsegmental atelectasis noted within the medial right lower lobe. Musculoskeletal: No suspicious bone lesions. CT ABDOMEN FINDINGS Hepatobiliary: Mild hepatic steatosis. Focal area of low attenuation within the medial segment of left lobe of liver adjacent to the falciform ligament measures 4 cm, image 17 of  series 7. Previous cholecystectomy. No biliary dilatation. Pancreas: Unremarkable. No pancreatic ductal dilatation or surrounding inflammatory changes. Spleen: Normal in size without focal abnormality. Adrenals/Urinary Tract: The adrenal glands appear normal. Unremarkable appearance of both kidneys. Stomach/Bowel: Postsurgical changes from previous hiatal hernia repair noted. Just below the diaphragmatic hiatus there is evidence of posterior extravasation of contrast material and extraluminal gas with surrounding inflammatory changes. The visualized upper abdominal bowel loops are nondilated. Vascular/Lymphatic: Aortic atherosclerosis. No aneurysm. No upper abdominal adenopathy. Other: None Musculoskeletal: Scoliosis and degenerative disc disease within the lumbar spine. IMPRESSION: 1. Examination again demonstrates extravasation of contrast material just below the level of the diaphragmatic hiatus from the Nissen fundoplication site compatible with breakdown of hiatal hernia repair. On the sagittal images the extravasation appears to extend posteriorly. On the coronal images there is lateral and medial extravasation of contrast. Emergent results were called by telephone at the time of interpretation on 04/11/2017 at 5:40 pm to Dr. Karie Soda , who verbally acknowledged these results. 2. Hepatic steatosis. Focal low-attenuation area within the medial left lobe of liver measures 4 mm. Indeterminate but favored to represent an area of focal fatty deposition. Suggest followup imaging with liver protocol MRI to confirm. 3. Small left effusion. 4.  Aortic Atherosclerosis (ICD10-I70.0). Electronically Signed   By: Signa Kell M.D.   On: 04/11/2017 17:42   Dg Abd Acute W/chest  Result Date: 04/10/2017 CLINICAL DATA:  Unresolved dysphagia since a hiatal hernia repair in November of 2018. Recent admission in December of 2018 for the same. EXAM: DG ABDOMEN ACUTE W/ 1V CHEST COMPARISON:  Chest and abdomen 02/16/2017  FINDINGS: Normal heart size. No pulmonary vascular congestion or edema. No focal consolidation. Mild scarring or atelectasis in the left lung base. No blunting of costophrenic angles. No pneumothorax. Scattered gas and stool in the colon. No small or large bowel distention. No free intra-abdominal air. No abnormal air-fluid levels. Surgical clips in the right upper quadrant. No radiopaque stones. Degenerative changes in the spine. Vascular calcifications. IMPRESSION: No evidence of active pulmonary disease. Mild scarring or atelectasis in the left lung base. Normal nonobstructive bowel gas pattern. Electronically Signed   By: Burman Nieves M.D.   On: 04/10/2017 22:42   Dg Esophagus W/water Sol Cm  Result Date: 04/11/2017 CLINICAL DATA:  Dysphagia.  Recent hiatal hernia repair. EXAM: ESOPHOGRAM TECHNIQUE: Single contrast examination was performed using water soluble contrast. FLUOROSCOPY TIME:  Fluoroscopy Time:  2.9 minutes Radiation Exposure Index (if provided by the fluoroscopic device): 47.70 mGy Number of Acquired Spot Images: 0 COMPARISON:  Esophagrams dated 02/01/2017 and 02/17/2017 FINDINGS: The patient took small sips of water soluble contrast in upright position. There is a visible leak from the distal esophagus at the level of the diaphragm extending to the right of midline. There is also marked narrowing of esophagus just below this leak but some contrast does pass through the area of narrowing into the distal esophagus and into the stomach. There appears to be a communication of the distal esophageal leak back into the fundus of the stomach to the right of the esophagus. IMPRESSION: 1. Extravasation of contrast from the distal esophagus to a the right of midline and extending around  the Nissen fundoplication. Contrast also appears to have a second communication from the area of extravasation back into the fundus of the stomach. 2. There are 2 areas of narrowing of the esophagus, one just distal to  the extravasation and one at the gastroesophageal junction. Electronically Signed   By: Francene Boyers M.D.   On: 04/11/2017 09:23

## 2017-04-11 NOTE — Progress Notes (Signed)
Initial Nutrition Assessment  DOCUMENTATION CODES:   Non-severe (moderate) malnutrition in context of acute illness/injury  INTERVENTION:    Boost Breeze po BID, each supplement provides 250 kcal and 9 grams of protein  Monitor for diet advancement/toleration  NUTRITION DIAGNOSIS:   Moderate Malnutrition related to acute illness(nissen fundoplication complications) as evidenced by 16.9% weight loss in 3 months, energy intake < or equal to 50% for > or equal to 1 month, mild fat depletion, mild muscle depletion.  GOAL:   Patient will meet greater than or equal to 90% of their needs   MONITOR:   PO intake, Supplement acceptance, Diet advancement, Weight trends, Labs  REASON FOR ASSESSMENT:   Malnutrition Screening Tool    ASSESSMENT:   Pt with PMH significant for large hiatal hernia s/p robotic repair with Nissen fundoplication 01/31/2017. Was hospitalized 12/8 for progressive dysphagia with regurgitation with anything PO. Directly admitted 1/29 for same symptoms.    Spoke with pt at bedside. Reports not being able to keep anything down x1 week related to feelings of food being stuck in her esophagus. With anything PO pt would regurgitate white frothy liquid. Intake has remained poor since surgery in November. Pt has not been able to tolerate regular consistency food since that date. Pt observed to be eating popsicle at bedside without any complication. Discussed clear supplements on clear liquid diet, pt amendable to try.   Pt reports a UBW of 165 lb, the last time being at that weight right before her surgery in November. Records indicate pt weighed 159 lb 01/31/17 and 132 lb this admission. This shows a 16.9% wt loss in 2 months.   Nutrition-Focused physical exam completed.   Medications reviewed and include: 20 mEq KCl, 20 mg pepcid, LR @ 125 ml/hr Labs reviewed: K 2.7 (L) Cl 93 (L)   NUTRITION - FOCUSED PHYSICAL EXAM:    Most Recent Value  Orbital Region  Mild  depletion  Upper Arm Region  Mild depletion  Thoracic and Lumbar Region  Unable to assess  Buccal Region  No depletion  Temple Region  Mild depletion  Clavicle Bone Region  Mild depletion  Clavicle and Acromion Bone Region  Moderate depletion  Scapular Bone Region  Unable to assess  Dorsal Hand  Mild depletion  Patellar Region  Mild depletion  Anterior Thigh Region  Mild depletion  Posterior Calf Region  Mild depletion  Edema (RD Assessment)  None  Hair  Reviewed  Eyes  Reviewed  Mouth  Reviewed  Skin  Reviewed  Nails  Reviewed     Diet Order:  Diet clear liquid Room service appropriate? Yes; Fluid consistency: Thin; Fluid restriction: 1500 mL Fluid  EDUCATION NEEDS:   Education needs have been addressed  Skin:  Skin Assessment: Reviewed RN Assessment  Last BM:  PTA  Height:   Ht Readings from Last 1 Encounters:  04/11/17 4\' 10"  (1.473 m)    Weight:   Wt Readings from Last 1 Encounters:  04/11/17 132 lb 7.9 oz (60.1 kg)    Ideal Body Weight:  45.5 kg  BMI:  Body mass index is 27.69 kg/m.  Estimated Nutritional Needs:   Kcal:  1600-1800 kcal/day  Protein:  80-90 g/day  Fluid:  >1.6 L/day    04/13/17 RD, LDN Clinical Nutrition Pager # - [phone]

## 2017-04-11 NOTE — Progress Notes (Signed)
Received report from Dwana Curd RN that patient received 4 runs of IV potassium on night shift 04/11/17. It seems that on the [facility] it was not documented. This RN witnessed IV potassium finishing up at shift change this am.

## 2017-04-11 NOTE — Progress Notes (Signed)
[facility]  [address] [place], [place] [address] Phone: [phone]  FAX: [phone]      Sydney [name] [id number] 04/23/1945  CARE TEAM:  PCP: Rory Percy, MD  Outpatient Care Team: Patient Care Team: Rory Percy, MD as PCP - General (Family Medicine) Michael Boston, MD as Consulting Physician (General Surgery) Danis, Kirke Corin, MD as Consulting Physician (Gastroenterology)  Inpatient Treatment Team: Treatment Team: Attending Provider: Michael Boston, MD; Registered Nurse: Aldean Ast, RN; Technician: Mamie Nick, NT   Problem List:   Active Problems:   Nausea & vomiting      * No surgery found *     Assessment  Failure to thrive with esophageal obstruction most likely due to ischemic stricture versus distal food impaction.  Feeling  better.  Plan:  IV Decadron times 72 hours.  That usually helps edema resolve.  I almost wonder if she has some impaction distally but fluoroscopy feels this is more mucosal and not intraluminal.  We will see.   Esophagram this morning.  Help delineate anatomy.  If evidence of a stricture is suspect, consider balloon dilations and/or stenting.  He does not appear toxic with mediastinitis nor pleuritic pain or pruritus.  No more effusions are noted no evidence of pneumonia.  There is no obvious pneumothorax or gas bubble to suspect major leak.  But would like to be diligent and check  Liquids only for now.  Overall better although guarded with the heartburn last night  Hypokalemia.  Corrected.  Follow with magnesium.  -HTN control -GERD control -VTE prophylaxis- SCDs, etc -mobilize as tolerated to help recovery  25 minutes spent in review, evaluation, examination, counseling, and coordination of care.  More than 50% of that time was spent in counseling.  [name], M.D., F.A.C.S. Gastrointestinal and Minimally Invasive Surgery [facility] [address] [place], [place] [address] [phone] Main / Paging   04/11/2017    Subjective: (Chief complaint)  Feeling better since rehydrated.  Tolerating some sips but still with significant dysphasia.  Rested better.  No fevers or chills.  Not retching or vomiting  Objective:  Vital signs:  Vitals:   04/10/17 1837 04/10/17 2109 04/10/17 2349 04/11/17 0428  BP:  134/67 (!) 128/55 (!) 122/52  Pulse:  90 84 76  Resp:  15 15 16   Temp:  (!) 97.3 F (36.3 C) 97.9 F (36.6 C) 98.2 F (36.8 C)  TempSrc:  Oral Oral Oral  SpO2:  97% 95% 94%  Height: 4' 10"  (1.473 m)          Intake/Output   Yesterday:  01/29 0701 - 01/30 0700 In: 120 [P.O.:120] Out: 200 [Urine:200] This shift:  No intake/output data recorded.  Bowel function:  Flatus: YES  BM:  No  Drain: (No drain)   Physical Exam:  General: Pt awake/alert/oriented x4 in no acute distress.  Smiling this morning Eyes: PERRL, normal EOM.  Sclera clear.  No icterus Neuro: CN II-XII intact w/o focal sensory/motor deficits. Lymph: No head/neck/groin lymphadenopathy Psych:  No delerium/psychosis/paranoia HENT: Normocephalic, Mucus membranes moist.  No thrush Neck: Supple, No tracheal deviation Chest: No chest wall pain w good excursion.  No wheezing.  Lungs clear .  no conversational dyspnea. CV:  Pulses intact.  Regular rhythm MS: Normal AROM mjr joints.  No obvious deformity  Abdomen: Soft.  Nondistended.  Nontender.  No evidence of peritonitis.  No incarcerated hernias.  Ext:  No deformity.  No mjr edema.  No cyanosis Skin: No petechiae / purpura  Results:   Labs: Results for orders placed or performed during the hospital encounter of 04/10/17 (from the past 48 hour(s))  CBC     Status: Abnormal   Collection Time: 04/10/17  6:46 PM  Result Value Ref Range   WBC 10.5 4.0 - 10.5 K/uL   RBC 5.18 (H) 3.87 - 5.11 MIL/uL   Hemoglobin 14.1 12.0 - 15.0 g/dL   HCT 41.0 36.0 -  46.0 %   MCV 79.2 78.0 - 100.0 fL   MCH 27.2 26.0 - 34.0 pg   MCHC 34.4 30.0 - 36.0 g/dL   RDW 14.8 11.5 - 15.5 %   Platelets 418 (H) 150 - 400 K/uL  Comprehensive metabolic panel     Status: Abnormal   Collection Time: 04/10/17  6:46 PM  Result Value Ref Range   Sodium 139 135 - 145 mmol/L   Potassium 2.7 (LL) 3.5 - 5.1 mmol/L    Comment: CRITICAL RESULT CALLED TO, READ BACK BY AND VERIFIED WITH: YOUNG,C AT 1932 ON 04/10/2017 BY MOSLEY,J    Chloride 93 (L) 101 - 111 mmol/L   CO2 31 22 - 32 mmol/L   Glucose, Bld 127 (H) 65 - 99 mg/dL   BUN 13 6 - 20 mg/dL   Creatinine, Ser 0.60 0.44 - 1.00 mg/dL   Calcium 9.5 8.9 - 10.3 mg/dL   Total Protein 7.1 6.5 - 8.1 g/dL   Albumin 3.3 (L) 3.5 - 5.0 g/dL   AST 21 15 - 41 U/L   ALT 14 14 - 54 U/L   Alkaline Phosphatase 82 38 - 126 U/L   Total Bilirubin 1.5 (H) 0.3 - 1.2 mg/dL   GFR calc non Af Amer >60 >60 mL/min   GFR calc Af Amer >60 >60 mL/min    Comment: (NOTE) The eGFR has been calculated using the CKD EPI equation. This calculation has not been validated in all clinical situations. eGFR's persistently <60 mL/min signify possible Chronic Kidney Disease.    Anion gap 15 5 - 15    Imaging / Studies: Dg Abd Acute W/chest  Result Date: 04/10/2017 CLINICAL DATA:  Unresolved dysphagia since a hiatal hernia repair in November of 2018. Recent admission in December of 2018 for the same. EXAM: DG ABDOMEN ACUTE W/ 1V CHEST COMPARISON:  Chest and abdomen 02/16/2017 FINDINGS: Normal heart size. No pulmonary vascular congestion or edema. No focal consolidation. Mild scarring or atelectasis in the left lung base. No blunting of costophrenic angles. No pneumothorax. Scattered gas and stool in the colon. No small or large bowel distention. No free intra-abdominal air. No abnormal air-fluid levels. Surgical clips in the right upper quadrant. No radiopaque stones. Degenerative changes in the spine. Vascular calcifications. IMPRESSION: No evidence of  active pulmonary disease. Mild scarring or atelectasis in the left lung base. Normal nonobstructive bowel gas pattern. Electronically Signed   By: Lucienne Capers M.D.   On: 04/10/2017 22:42    Medications / Allergies: per chart  Antibiotics: Anti-infectives (From admission, onward)   None        Note: Portions of this report may have been transcribed using voice recognition software. Every effort was made to ensure accuracy; however, inadvertent computerized transcription errors may be present.   Any transcriptional errors that result from this process are unintentional.     [name], M.D., F.A.C.S. Gastrointestinal and Minimally Invasive Surgery [facility] [address]  [address] [place], [place] [address] [phone] Main / Paging   04/11/2017

## 2017-04-12 ENCOUNTER — Encounter (HOSPITAL_COMMUNITY): Payer: Self-pay | Admitting: Registered Nurse

## 2017-04-12 ENCOUNTER — Encounter (HOSPITAL_COMMUNITY)
Admission: AD | Disposition: A | Discharge status: Home / Self Care | Payer: Self-pay | Source: Ambulatory Visit | Attending: Surgery

## 2017-04-12 ENCOUNTER — Inpatient Hospital Stay (HOSPITAL_COMMUNITY): Payer: Medicare Other | Admitting: Registered Nurse

## 2017-04-12 DIAGNOSIS — R1310 Dysphagia, unspecified: Secondary | ICD-10-CM

## 2017-04-12 DIAGNOSIS — K223 Perforation of esophagus: Secondary | ICD-10-CM

## 2017-04-12 DIAGNOSIS — K222 Esophageal obstruction: Principal | ICD-10-CM

## 2017-04-12 DIAGNOSIS — E871 Hypo-osmolality and hyponatremia: Secondary | ICD-10-CM

## 2017-04-12 DIAGNOSIS — K228 Other specified diseases of esophagus: Secondary | ICD-10-CM

## 2017-04-12 DIAGNOSIS — K2289 Other specified disease of esophagus: Secondary | ICD-10-CM

## 2017-04-12 DIAGNOSIS — K760 Fatty (change of) liver, not elsewhere classified: Secondary | ICD-10-CM

## 2017-04-12 HISTORY — PX: ESOPHAGOGASTRODUODENOSCOPY: SHX5428

## 2017-04-12 HISTORY — DX: Other specified disease of esophagus: K22.89

## 2017-04-12 LAB — BASIC METABOLIC PANEL
Anion gap: 9 (ref 5–15)
BUN: 5 mg/dL — AB (ref 6–20)
CALCIUM: 8.6 mg/dL — AB (ref 8.9–10.3)
CO2: 30 mmol/L (ref 22–32)
Chloride: 99 mmol/L — ABNORMAL LOW (ref 101–111)
Creatinine, Ser: 0.44 mg/dL (ref 0.44–1.00)
GFR calc Af Amer: >60 mL/min (ref >60)
GLUCOSE: 129 mg/dL — AB (ref 65–99)
Potassium: 3 mmol/L — ABNORMAL LOW (ref 3.5–5.1)
Sodium: 138 mmol/L (ref 135–145)

## 2017-04-12 LAB — MAGNESIUM: Magnesium: 1.6 mg/dL — ABNORMAL LOW (ref 1.7–2.4)

## 2017-04-12 SURGERY — EGD (ESOPHAGOGASTRODUODENOSCOPY)
Anesthesia: Monitor Anesthesia Care

## 2017-04-12 MED ORDER — LIDOCAINE HCL (CARDIAC) 20 MG/ML IV SOLN
INTRAVENOUS | Status: DC | PRN
  Administered 2017-04-12: 75 mg via INTRAVENOUS
  Administered 2017-04-12: 25 mg via INTRATRACHEAL

## 2017-04-12 MED ORDER — PROPOFOL 10 MG/ML IV BOLUS
INTRAVENOUS | Status: DC | PRN
  Administered 2017-04-12: 100 mg via INTRAVENOUS

## 2017-04-12 MED ORDER — LACTATED RINGERS IV SOLN
INTRAVENOUS | Status: DC | PRN
  Administered 2017-04-12: 13:00:00 via INTRAVENOUS

## 2017-04-12 MED ORDER — FENTANYL CITRATE (PF) 100 MCG/2ML IJ SOLN: INTRAMUSCULAR | Status: DC | PRN

## 2017-04-12 MED ORDER — LACTATED RINGERS IV SOLN
INTRAVENOUS | Status: DC
  Administered 2017-04-12: 13:00:00 via INTRAVENOUS

## 2017-04-12 MED ORDER — ONDANSETRON HCL 4 MG/2ML IJ SOLN
INTRAMUSCULAR | Status: DC | PRN
  Administered 2017-04-12: 4 mg via INTRAVENOUS

## 2017-04-12 MED ORDER — PROPOFOL 10 MG/ML IV BOLUS
INTRAVENOUS | Status: AC
  Filled 2017-04-12: qty 60

## 2017-04-12 MED ORDER — PHENYLEPHRINE HCL 10 MG/ML IJ SOLN
INTRAMUSCULAR | Status: DC | PRN
  Administered 2017-04-12: 80 ug via INTRAVENOUS

## 2017-04-12 MED ORDER — SUCCINYLCHOLINE CHLORIDE 20 MG/ML IJ SOLN
INTRAMUSCULAR | Status: DC | PRN
  Administered 2017-04-12: 100 mg via INTRAVENOUS

## 2017-04-12 MED ORDER — IOPAMIDOL (ISOVUE-300) INJECTION 61%
30.0000 mL | Freq: Once | INTRAVENOUS | Status: AC | PRN
  Administered 2017-04-11: 30 mL via ORAL

## 2017-04-12 MED ORDER — FENTANYL CITRATE (PF) 100 MCG/2ML IJ SOLN
INTRAMUSCULAR | Status: AC
  Filled 2017-04-12: qty 2

## 2017-04-12 MED ORDER — FENTANYL CITRATE (PF) 100 MCG/2ML IJ SOLN
INTRAMUSCULAR | Status: DC | PRN
  Administered 2017-04-12: 50 ug via INTRAVENOUS

## 2017-04-12 NOTE — Progress Notes (Signed)
[facility]  [address] [place], [place] [address] Phone: [phone]  FAX: [place] [phone] 03/25/1945  CARE TEAM:  PCP: Rory Percy, MD  Outpatient Care Team: Patient Care Team: Rory Percy, MD as PCP - General (Family Medicine) Michael Boston, MD as Consulting Physician (General Surgery) Geyser, Kirke Corin, MD as Consulting Physician (Gastroenterology)  Inpatient Treatment Team: Treatment Team: Attending Provider: Michael Boston, MD; Technician: Mamie Nick, NT; Technician: Gardiner Ramus, NT   Problem List:   Principal Problem:   Nausea & vomiting Active Problems:   Paraesophageal hiatal hernia s/p repair 01/31/2017   Gastroesophageal reflux disease   Hypertension   Failure to thrive (0-17)   Esophageal obstruction - partial   Hypokalemia   Malnutrition of moderate degree      * No surgery found *     Assessment  Failure to thrive with esophageal obstruction most likely due to ischemic stricture versus distal food impaction.  Feeling  better.  Plan:  Esophagram and CT of chest and abdomen is suspicious for delayed esophageal leak at the level of the fundoplication which is intra-abdominal.  It is contained and not causing her sepsis or mediastinitis or peritonitis.  However she does have significant dysphagia from esophageal obstructive and leaking symptoms.  Discussed the case with [facility] gastroenterology.  Dr. Oretha Caprice.  Discussed with my partners.  I am hopeful that a covered stent could be placed across this region to patch up the leak and control it.  Resolved the stricturing and resultant dysphasia.  Possible need for gastrostomy tube for tube feedings etc. if this is prolonged.  Give it a chance to resolve without reoperation. If that fails, may need to do reoperation & patch the leak.  Not any ideal time with all the adhesions plus her being malnourished and  deconditioned.    Most likely would have to take down the wrap to more aggressively find a leak in perhaps switch to an anterior wrap if it is an anterior perforation.  Omental patching.  Possible gastrostomy tube.  Final option would be esophagectomy.    She is not sickly or toxic at this time and it seems to be a focal contained leak, so I am hopeful the lesser graft of options will handle this.  This would not be an ideal time to go back with surgery given her deconditioned state and postoperative adhesions.  Risk of greater tears or other problems higher.  Risk of repairs breaking down worse than the time of surgery.  She is not toxic requiring emergency intervention tonight, but will work on a more definitive plan this admission.  IV Decadron times 72 hours.  That usually helps edema resolve.    IV antibiotics to help minimize infection progression  N.p.o. for now.  Restart liquids and advance perhaps to dysphasia 1 diet if stent placed and leak patched/controlled   Hypokalemia.  Corrected.  Follow with magnesium.  -HTN control -GERD control -VTE prophylaxis- SCDs, etc -mobilize as tolerated to help recovery  25 minutes spent in review, evaluation, examination, counseling, and coordination of care.  More than 50% of that time was spent in counseling.  [name], M.D., F.A.C.S. Gastrointestinal and Minimally Invasive Surgery [facility] [address] [place], [place] [address] [phone] Main / Paging   04/12/2017    Subjective: (Chief complaint)  Feeling better since rehydrated. Rested better.  No  fevers or chills.  Not retching or vomiting  Objective:  Vital signs:  Vitals:   04/11/17 1400 04/11/17 1800 04/11/17 2248 04/12/17 0635  BP: 130/63 139/72 129/68 (!) 106/57  Pulse: 74 80 69 64  Resp: 18 18 18 18   Temp: 98.2 F (36.8 C) 97.9 F (36.6 C) 98 F (36.7 C) 97.6 F (36.4 C)  TempSrc: Oral Oral Oral Oral  SpO2: 97%  97% 96% 97%  Weight:      Height:           Intake/Output   Yesterday:  01/30 0701 - 01/31 0700 In: 16392.4 [P.O.:800.7; I.V.:4041.7; IV [id number]:42706] Out: 925 [Urine:925] This shift:  No intake/output data recorded.  Bowel function:  Flatus: YES  BM:  No  Drain: (No drain)   Physical Exam:  General: Pt awake/alert/oriented x4 in no acute distress.  Smiling this morning Eyes: PERRL, normal EOM.  Sclera clear.  No icterus Neuro: CN II-XII intact w/o focal sensory/motor deficits. Lymph: No head/neck/groin lymphadenopathy Psych:  No delerium/psychosis/paranoia HENT: Normocephalic, Mucus membranes moist.  No thrush Neck: Supple, No tracheal deviation Chest: No chest wall pain w good excursion.  No wheezing.  Lungs clear .  no conversational dyspnea. CV:  Pulses intact.  Regular rhythm MS: Normal AROM mjr joints.  No obvious deformity  Abdomen: Soft.  Nondistended.  Nontender.  No evidence of peritonitis.  No incarcerated hernias.  Ext:  No deformity.  No mjr edema.  No cyanosis Skin: No petechiae / purpura  Results:   Labs: Results for orders placed or performed during the hospital encounter of 04/10/17 (from the past 48 hour(s))  CBC     Status: Abnormal   Collection Time: 04/10/17  6:46 PM  Result Value Ref Range   WBC 10.5 4.0 - 10.5 K/uL   RBC 5.18 (H) 3.87 - 5.11 MIL/uL   Hemoglobin 14.1 12.0 - 15.0 g/dL   HCT 41.0 36.0 - 46.0 %   MCV 79.2 78.0 - 100.0 fL   MCH 27.2 26.0 - 34.0 pg   MCHC 34.4 30.0 - 36.0 g/dL   RDW 14.8 11.5 - 15.5 %   Platelets 418 (H) 150 - 400 K/uL  Comprehensive metabolic panel     Status: Abnormal   Collection Time: 04/10/17  6:46 PM  Result Value Ref Range   Sodium 139 135 - 145 mmol/L   Potassium 2.7 (LL) 3.5 - 5.1 mmol/L    Comment: CRITICAL RESULT CALLED TO, READ BACK BY AND VERIFIED WITH: YOUNG,C AT 1932 ON 04/10/2017 BY MOSLEY,J    Chloride 93 (L) 101 - 111 mmol/L   CO2 31 22 - 32 mmol/L   Glucose, Bld 127 (H) 65 - 99  mg/dL   BUN 13 6 - 20 mg/dL   Creatinine, Ser 0.60 0.44 - 1.00 mg/dL   Calcium 9.5 8.9 - 10.3 mg/dL   Total Protein 7.1 6.5 - 8.1 g/dL   Albumin 3.3 (L) 3.5 - 5.0 g/dL   AST 21 15 - 41 U/L   ALT 14 14 - 54 U/L   Alkaline Phosphatase 82 38 - 126 U/L   Total Bilirubin 1.5 (H) 0.3 - 1.2 mg/dL   GFR calc non Af Amer >60 >60 mL/min   GFR calc Af Amer >60 >60 mL/min    Comment: (NOTE) The eGFR has been calculated using the CKD EPI equation. This calculation has not been validated in all clinical situations. eGFR's persistently <60 mL/min signify possible Chronic Kidney Disease.    Anion gap  15 5 - 15    Imaging / Studies: Ct Chest W Contrast  Result Date: 04/11/2017 CLINICAL DATA:  Status post hiatal hernia repair with anastomotic breakdown. EXAM: CT CHEST AND ABDOMEN WITH CONTRAST TECHNIQUE: Multidetector CT imaging of the chest and abdomen was performed following the standard protocol during bolus administration of intravenous contrast. CONTRAST:  36m ISOVUE-300 IOPAMIDOL (ISOVUE-300) INJECTION 61%, 1035mISOVUE-300 IOPAMIDOL (ISOVUE-300) INJECTION 61% COMPARISON:  Swallow study from earlier today. FINDINGS: CT CHEST FINDINGS Cardiovascular: Normal heart size. No pericardial effusion. Mild aortic atherosclerosis. Mediastinum/Nodes: The trachea appears patent and is midline. Dilated and contrast esophagus containing identified. Postoperative changes from hiatal hernia repair are identified at the level of the GE junction. There is surrounding fluid, inflammation and extraluminal enteric contrast material and gas identified. Although most of the enteric contrast material passes into the gastric fundus the extravasated contrast material from swallow study can be seen within the surrounding soft tissues just below the diaphragmatic hiatus, image 98 of series 6 and image 45 of series 2. Lungs/Pleura: A small left pleural effusion is identified. Subsegmental atelectasis noted within the medial right  lower lobe. Musculoskeletal: No suspicious bone lesions. CT ABDOMEN FINDINGS Hepatobiliary: Mild hepatic steatosis. Focal area of low attenuation within the medial segment of left lobe of liver adjacent to the falciform ligament measures 4 cm, image 17 of series 7. Previous cholecystectomy. No biliary dilatation. Pancreas: Unremarkable. No pancreatic ductal dilatation or surrounding inflammatory changes. Spleen: Normal in size without focal abnormality. Adrenals/Urinary Tract: The adrenal glands appear normal. Unremarkable appearance of both kidneys. Stomach/Bowel: Postsurgical changes from previous hiatal hernia repair noted. Just below the diaphragmatic hiatus there is evidence of posterior extravasation of contrast material and extraluminal gas with surrounding inflammatory changes. The visualized upper abdominal bowel loops are nondilated. Vascular/Lymphatic: Aortic atherosclerosis. No aneurysm. No upper abdominal adenopathy. Other: None Musculoskeletal: Scoliosis and degenerative disc disease within the lumbar spine. IMPRESSION: 1. Examination again demonstrates extravasation of contrast material just below the level of the diaphragmatic hiatus from the Nissen fundoplication site compatible with breakdown of hiatal hernia repair. On the sagittal images the extravasation appears to extend posteriorly. On the coronal images there is lateral and medial extravasation of contrast. Emergent results were called by telephone at the time of interpretation on 04/11/2017 at 5:40 pm to Dr. STMichael Boston who verbally acknowledged these results. 2. Hepatic steatosis. Focal low-attenuation area within the medial left lobe of liver measures 4 mm. Indeterminate but favored to represent an area of focal fatty deposition. Suggest followup imaging with liver protocol MRI to confirm. 3. Small left effusion. 4.  Aortic Atherosclerosis (ICD10-I70.0). Electronically Signed   By: TaKerby Moors.D.   On: 04/11/2017 17:42   Ct  Abdomen W Contrast  Result Date: 04/11/2017 CLINICAL DATA:  Status post hiatal hernia repair with anastomotic breakdown. EXAM: CT CHEST AND ABDOMEN WITH CONTRAST TECHNIQUE: Multidetector CT imaging of the chest and abdomen was performed following the standard protocol during bolus administration of intravenous contrast. CONTRAST:  1072mSOVUE-300 IOPAMIDOL (ISOVUE-300) INJECTION 61%, 100m19mOVUE-300 IOPAMIDOL (ISOVUE-300) INJECTION 61% COMPARISON:  Swallow study from earlier today. FINDINGS: CT CHEST FINDINGS Cardiovascular: Normal heart size. No pericardial effusion. Mild aortic atherosclerosis. Mediastinum/Nodes: The trachea appears patent and is midline. Dilated and contrast esophagus containing identified. Postoperative changes from hiatal hernia repair are identified at the level of the GE junction. There is surrounding fluid, inflammation and extraluminal enteric contrast material and gas identified. Although most of the enteric contrast material passes into the  gastric fundus the extravasated contrast material from swallow study can be seen within the surrounding soft tissues just below the diaphragmatic hiatus, image 98 of series 6 and image 45 of series 2. Lungs/Pleura: A small left pleural effusion is identified. Subsegmental atelectasis noted within the medial right lower lobe. Musculoskeletal: No suspicious bone lesions. CT ABDOMEN FINDINGS Hepatobiliary: Mild hepatic steatosis. Focal area of low attenuation within the medial segment of left lobe of liver adjacent to the falciform ligament measures 4 cm, image 17 of series 7. Previous cholecystectomy. No biliary dilatation. Pancreas: Unremarkable. No pancreatic ductal dilatation or surrounding inflammatory changes. Spleen: Normal in size without focal abnormality. Adrenals/Urinary Tract: The adrenal glands appear normal. Unremarkable appearance of both kidneys. Stomach/Bowel: Postsurgical changes from previous hiatal hernia repair noted. Just below  the diaphragmatic hiatus there is evidence of posterior extravasation of contrast material and extraluminal gas with surrounding inflammatory changes. The visualized upper abdominal bowel loops are nondilated. Vascular/Lymphatic: Aortic atherosclerosis. No aneurysm. No upper abdominal adenopathy. Other: None Musculoskeletal: Scoliosis and degenerative disc disease within the lumbar spine. IMPRESSION: 1. Examination again demonstrates extravasation of contrast material just below the level of the diaphragmatic hiatus from the Nissen fundoplication site compatible with breakdown of hiatal hernia repair. On the sagittal images the extravasation appears to extend posteriorly. On the coronal images there is lateral and medial extravasation of contrast. Emergent results were called by telephone at the time of interpretation on 04/11/2017 at 5:40 pm to Dr. Michael Boston , who verbally acknowledged these results. 2. Hepatic steatosis. Focal low-attenuation area within the medial left lobe of liver measures 4 mm. Indeterminate but favored to represent an area of focal fatty deposition. Suggest followup imaging with liver protocol MRI to confirm. 3. Small left effusion. 4.  Aortic Atherosclerosis (ICD10-I70.0). Electronically Signed   By: Kerby Moors M.D.   On: 04/11/2017 17:42   Dg Abd Acute W/chest  Result Date: 04/10/2017 CLINICAL DATA:  Unresolved dysphagia since a hiatal hernia repair in November of 2018. Recent admission in December of 2018 for the same. EXAM: DG ABDOMEN ACUTE W/ 1V CHEST COMPARISON:  Chest and abdomen 02/16/2017 FINDINGS: Normal heart size. No pulmonary vascular congestion or edema. No focal consolidation. Mild scarring or atelectasis in the left lung base. No blunting of costophrenic angles. No pneumothorax. Scattered gas and stool in the colon. No small or large bowel distention. No free intra-abdominal air. No abnormal air-fluid levels. Surgical clips in the right upper quadrant. No radiopaque  stones. Degenerative changes in the spine. Vascular calcifications. IMPRESSION: No evidence of active pulmonary disease. Mild scarring or atelectasis in the left lung base. Normal nonobstructive bowel gas pattern. Electronically Signed   By: Lucienne Capers M.D.   On: 04/10/2017 22:42   Dg Esophagus W/water Sol Cm  Result Date: 04/11/2017 CLINICAL DATA:  Dysphagia.  Recent hiatal hernia repair. EXAM: ESOPHOGRAM TECHNIQUE: Single contrast examination was performed using water soluble contrast. FLUOROSCOPY TIME:  Fluoroscopy Time:  2.9 minutes Radiation Exposure Index (if provided by the fluoroscopic device): 47.70 mGy Number of Acquired Spot Images: 0 COMPARISON:  Esophagrams dated 02/01/2017 and 02/17/2017 FINDINGS: The patient took small sips of water soluble contrast in upright position. There is a visible leak from the distal esophagus at the level of the diaphragm extending to the right of midline. There is also marked narrowing of esophagus just below this leak but some contrast does pass through the area of narrowing into the distal esophagus and into the stomach. There appears to be a  communication of the distal esophageal leak back into the fundus of the stomach to the right of the esophagus. IMPRESSION: 1. Extravasation of contrast from the distal esophagus to a the right of midline and extending around the Nissen fundoplication. Contrast also appears to have a second communication from the area of extravasation back into the fundus of the stomach. 2. There are 2 areas of narrowing of the esophagus, one just distal to the extravasation and one at the gastroesophageal junction. Electronically Signed   By: Lorriane Shire M.D.   On: 04/11/2017 09:23    Medications / Allergies: per chart  Antibiotics: Anti-infectives (From admission, onward)   Start     Dose/Rate Route Frequency Ordered Stop   04/11/17 1800  piperacillin-tazobactam (ZOSYN) IVPB 3.375 g     3.375 g 12.5 mL/hr over 240 Minutes  Intravenous Every 8 hours 04/11/17 1750          Note: Portions of this report may have been transcribed using voice recognition software. Every effort was made to ensure accuracy; however, inadvertent computerized transcription errors may be present.   Any transcriptional errors that result from this process are unintentional.     [name], M.D., F.A.C.S. Gastrointestinal and Minimally Invasive Surgery [facility] [address] [place], [place] [address] [phone] Main / Paging   04/12/2017

## 2017-04-12 NOTE — Interval H&P Note (Signed)
History and Physical Interval Note:  04/12/2017 12:58 PM  Sydney [name]  has presented today for surgery, with the diagnosis of dysphagia, s/p nissen fundoplication.  suspected esophageal leak post op  The various methods of treatment have been discussed with the patient and family. After consideration of risks, benefits and other options for treatment, the patient has consented to  Procedure(s): ESOPHAGOGASTRODUODENOSCOPY (EGD) (N/A) ESOPHAGEAL STENT PLACEMENT (N/A) as a surgical intervention .  The patient's history has been reviewed, patient examined, no change in status, stable for surgery.  I have reviewed the patient's chart and labs.  Questions were answered to the patient's satisfaction.     Rachael Fee

## 2017-04-12 NOTE — Transfer of Care (Signed)
Immediate Anesthesia Transfer of Care Note  Patient: Sydney [name]  Procedure(s) Performed: ESOPHAGOGASTRODUODENOSCOPY (EGD) (N/A )  Patient Location: PACU  Anesthesia Type:General  Level of Consciousness: awake, alert , oriented and patient cooperative  Airway & Oxygen Therapy: Patient Spontanous Breathing and Patient connected to face mask oxygen  Post-op Assessment: Report given to RN, Post -op Vital signs reviewed and stable and Patient moving all extremities X 4  Post vital signs: stable  Last Vitals:  Vitals:   04/12/17 1235 04/12/17 1350  BP: (!) 155/59 (!) 123/45  Pulse: 73 82  Resp: 12 16  Temp: 36.4 C 36.6 C  SpO2: 99% 100%    Last Pain:  Vitals:   04/12/17 1350  TempSrc: Oral  PainSc:          Complications: No apparent anesthesia complications

## 2017-04-12 NOTE — Op Note (Addendum)
[facility] Patient Name: Sydney [name] Procedure Date: 04/12/2017 MRN: [id number] Attending MD: Rachael Fee , MD Date of Birth: 05-06-1945 CSN: [id number] Age: 72 Admit Type: Inpatient Procedure:                Upper GI endoscopy Indications:              Dysphagia, Abnormal UGI series, Abnormal CT of the                            GI tract Providers:                Rachael Fee, MD, Leandrew Koyanagi, RN,                            Kandice Robinsons, Technician, Madalyn Rob,                            Technician, Tomma Rakers, RN, Harold Barban, RN Referring MD:             Estelle Grumbles, MD Medicines:                General Anesthesia Complications:            No immediate complications. Estimated blood loss:                            None. Estimated Blood Loss:     Estimated blood loss: none. Procedure:                Pre-Anesthesia Assessment:                           - Prior to the procedure, a History and Physical                            was performed, and patient medications and                            allergies were reviewed. The patient's tolerance of                            previous anesthesia was also reviewed. The risks                            and benefits of the procedure and the sedation                            options and risks were discussed with the patient.                            All questions were answered, and informed consent                            was obtained. Prior Anticoagulants: The patient has  taken Lovenox (enoxaparin), last dose was 1 day                            prior to procedure. ASA Grade Assessment: III - A                            patient with severe systemic disease. After                            reviewing the risks and benefits, the patient was                            deemed in satisfactory condition to undergo the                            procedure.                   After obtaining informed consent, the endoscope was                            passed under direct vision. Throughout the                            procedure, the patient's blood pressure, pulse, and                            oxygen saturations were monitored continuously. The                            [id number]) scope was introduced through the                            mouth, and advanced to the middle third of                            esophagus. The procedure was aborted due to                            presence of foreign body. Scope In: Scope Out: Findings:      There was a surgical foreign body in the distal esophagus that appeared       to be sutured to the wall of the esophagus.      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      There was a 'defect' in the wall of the esophagus, about 7-53mm across,       adjacent to the pinpoint stricture. I could visualize slightly into the       defect and this did not appear to be esophageal lumen, contained       whitish, viscous exudate that may be contrast.      I spoke with Dr. Michaell Cowing during the procedure about the findings, and       elected to terminate the case. Impression:               There was a surgical foreign body  in the distal                            esophagus that appeared to be sutured to the wall                            of the esophagus. 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 I think this leads to the esophageal                            lumen. It is pinpoint and I do not think it would                            accept an esophageal stent.                           There was a 'defect' in the wall of the esophagus,                            about 7-40mm across, adjacent to the pinpoint                            stricture. I could visualize slightly into the                             defect and this did not appear to be esophageal                            lumen, contained whitish, viscous exudate that may                            be contrast. I think this is the site of                            extravasation.                           I will discuss this case with my colleagues and                            surgery again, consider further options. Moderate Sedation:      N/A- Per Anesthesia Care Recommendation:           - Patient has a contact number available for                            emergencies. The signs and symptoms of potential  delayed complications were discussed with the                            patient. Return to normal activities tomorrow.                            Written discharge instructions were provided to the                            patient.                           - NPO for now. Procedure Code(s):        --- Professional ---                           [phone], Esophagogastroduodenoscopy, flexible,                            transoral; diagnostic, including collection of                            specimen(s) by brushing or washing, when performed                            (separate procedure) Diagnosis Code(s):        --- Professional ---                           Z53.8, Procedure and treatment not carried out for                            other reasons                           Z97.8, Presence of other specified devices                           R13.10, Dysphagia, unspecified                           R93.3, Abnormal findings on diagnostic imaging of                            other parts of digestive tract CPT copyright 2016 [employer]. All rights reserved. The codes documented in this report are preliminary and upon coder review may  be revised to meet current compliance requirements. Rachael Fee, MD 04/12/2017 1:55:18 PM This report has been signed  electronically. Number of Addenda: 0

## 2017-04-12 NOTE — Anesthesia Procedure Notes (Addendum)
Procedure Name: Intubation Date/Time: 04/12/2017 1:11 PM Performed by: Lissa Morales, CRNA Pre-anesthesia Checklist: Patient identified, Emergency Drugs available, Suction available and Patient being monitored Patient Re-evaluated:Patient Re-evaluated prior to induction Oxygen Delivery Method: Circle system utilized Preoxygenation: Pre-oxygenation with 100% oxygen Induction Type: IV induction, Cricoid Pressure applied and Rapid sequence Laryngoscope Size: Mac and 3 Grade View: Grade II Tube type: Oral Number of attempts: 1 Airway Equipment and Method: Stylet and Oral airway Placement Confirmation: ETT inserted through vocal cords under direct vision,  positive ETCO2 and breath sounds checked- equal and bilateral Secured at: 21 cm Tube secured with: Tape Dental Injury: Teeth and Oropharynx as per pre-operative assessment

## 2017-04-12 NOTE — Anesthesia Postprocedure Evaluation (Signed)
Anesthesia Post Note  Patient: Sydney [name]  Procedure(s) Performed: ESOPHAGOGASTRODUODENOSCOPY (EGD) (N/A )     Patient location during evaluation: Endoscopy Anesthesia Type: General Level of consciousness: awake and alert Pain management: pain level controlled Vital Signs Assessment: post-procedure vital signs reviewed and stable Respiratory status: spontaneous breathing, nonlabored ventilation, respiratory function stable and patient connected to nasal cannula oxygen Cardiovascular status: blood pressure returned to baseline and stable Postop Assessment: no apparent nausea or vomiting Anesthetic complications: no    Last Vitals:  Vitals:   04/12/17 1415 04/12/17 1420  BP:  (!) 126/47  Pulse: 87 70  Resp: 19 12  Temp:    SpO2: 100% 96%    Last Pain:  Vitals:   04/12/17 1350  TempSrc: Oral  PainSc:                  Trevor Iha

## 2017-04-12 NOTE — Consult Note (Signed)
Sydney [name]: 9:30 AM 04/12/2017  LOS: 2 days    Referring Provider: Dr Michaell Cowing  Primary Care Physician:  Selinda Flavin, MD Primary Gastroenterologist:  Dr. Myrtie Neither    Reason for Consultation:  Complications after fundoplication.     HPI: Sydney [name] is a 72 y.o. female.  Hx obesity.  HTN. Previous colonoscopy x 2 in [place] (last ~ 10 yr ago) with finding of hemorrhoids.  Constipation.      GERD.  Large HH.   EGD 11/16/16:  Large HH, 2/3rds to 3/4 of stomach is intrathoracic. GES 12/08/16: Normal 4 hour study S/p 01/31/17 1.RoboticLaparoscopic reduction of paraesophageal hiatal hernia 2. Type II mediastinal dissection.  3. Primary repair of hiatal hernia over pledgets.  3.  5 Mesh reinforcement (Phasix) of hiatal hernia repair  4. Anterior & posterior gastropexy.  5. Nissen fundoplication 2 cm over a 56-French bougie Esophagram #1 02/01/17:   Swelling in region of fundoplication with associated GEJx narrowing.  Mild esophageal retention of swallowed contrast, clears after dry swallows.  No leak. Treated with IV steroids.   Discharge POD 2 on puree diet.     Readmitted 12/7 - 02/20/17 due to dysphagia, FTT.   Esophagram #2 02/17/17:   Progressive near-obstructive complex narrowing of the distal esophageal lumen at the level of the fundoplication. No leak Treated with IV abx, IV steroids and discharged on thin liquid diet.  Zantac 300 mg BID.    Has never tolerated solids.  Progressive dysphagia and recently only able to take small sips of liquids..  Liquid PO quickly leads to regurgitation.  Bad taste in mouth.  No nausea, no abdominal or chest pain.  No sweats or chills.  No dyspnea, infact the surgery led to improvement in her breathing. Treated constipation with laxatives and had limited diarrhea.   Treated hemorrhoidal swelling and pain with Preparation H.  As of last Friday, after phone call to surgeon, he RXd Decadron but this was not filled by pt.  Seen at office Tuesday 1/29 and admitted from there.  A year ago she weighed 167 pounds.  Currently weighs 132 pounds.  Esophagram #3 04/11/17:   1. Extravasation of contrast from the distal esophagus to the right of midline and extending around the Nissen fundoplication. Contrast also appears to have a second communication from the area of extravasation back into the fundus of the stomach. 2. There are 2 areas of narrowing of the esophagus, one just distal to the extravasation and one at the gastroesophageal junction. CT abdomen 04/11/17:   extravasation of contrast material just below the level of the diaphragmatic hiatus from the fundoplication site compatible with breakdown of hiatal hernia repair. On the sagittal images the extravasation appears to extend posteriorly. On the coronal images there is lateral and medial extravasation of contrast. 04/10/17 AAS: no active dz.  Scarring vs atx in left base.  NOBGP  Labs with hypokalemia.  Normal WBCs and Hgb.  Hyperglycemia.     Past Medical History:  Diagnosis Date  . Arthritis   . Hiatal hernia with  obstruction but no gangrene   . Hypertension   . Obesity   . 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 Seasonal allergies   . Thyroid nodule   . Vertigo     Past Surgical History:  Procedure Laterality Date  . CATARACT EXTRACTION, BILATERAL  2016  . CHOLECYSTECTOMY    . ESOPHAGEAL MANOMETRY N/A 01/03/2017   Procedure: ESOPHAGEAL MANOMETRY (EM);  Surgeon: Napoleon Form, MD;  Location: [facility];  Service: Endoscopy;  Laterality: N/A;  . INSERTION OF MESH N/A 01/31/2017   Procedure: INSERTION OF MESH;  Surgeon: Karie Soda, MD;  Location: [facility];  Service: General;  Laterality: N/A;  . MOUTH SURGERY    . TUBAL LIGATION      Prior to Admission medications   Medication  Sig Start Date End Date Taking? Authorizing Provider  acetaminophen (TYLENOL) 500 MG tablet Take 500 mg by mouth every 4 (four) hours as needed for mild pain or headache (for pain.).    Yes [provider]  ondansetron (ZOFRAN) 4 MG tablet Take 1 tablet (4 mg total) by mouth every 8 (eight) hours as needed for nausea. 02/20/17  Yes Karie Soda, MD  promethazine (PHENERGAN) 25 MG suppository Place 1 suppository (25 mg total) rectally every 6 (six) hours as needed for nausea. 01/31/17  Yes Karie Soda, MD  chlorthalidone (HYGROTON) 25 MG tablet Take 25 mg daily as needed by mouth (for fluid retention/swelling.).     [provider]  ranitidine (ZANTAC) 300 MG tablet Take 300 mg by mouth 2 (two) times daily as needed for heartburn.     [provider]  simethicone (MYLICON) 40 MG/0.6ML drops Take 0.6 mLs (40 mg total) by mouth 4 (four) times daily. Patient not taking: Reported on 04/10/2017 02/20/17   Karie Soda, MD    Scheduled Meds: . chlorhexidine  15 mL Mouth Rinse BID  . dexamethasone  4 mg Intravenous Q12H  . enoxaparin (LOVENOX) injection  40 mg Subcutaneous Q24H  . feeding supplement  1 Container Oral BID BM  . lip balm  1 application Topical BID  . mouth rinse  15 mL Mouth Rinse q12n4p  . ondansetron  4 mg Intravenous Q6H  . potassium chloride  60 mEq Oral Once  . simethicone  40 mg Oral QID   Infusions: . chlorproMAZINE (THORAZINE) IV    . famotidine (PEPCID) IV 20 mg (04/11/17 2106)  . lactated ringers    . lactated ringers 1,000 mL (04/11/17 1951)  . lactated ringers 125 mL/hr at 04/11/17 0959  . methocarbamol (ROBAXIN)  IV    . piperacillin-tazobactam (ZOSYN)  IV 3.375 g (04/12/17 0257)   PRN Meds: acetaminophen **OR** acetaminophen, alum & mag hydroxide-simeth, bisacodyl, chlorproMAZINE (THORAZINE) IV, diphenhydrAMINE **OR** diphenhydrAMINE, fentaNYL (SUBLIMAZE) injection, guaiFENesin-dextromethorphan, hydrALAZINE, hydrocortisone,  hydrocortisone cream, hydrocortisone-pramoxine, lactated ringers, magic mouthwash, menthol-cetylpyridinium, methocarbamol (ROBAXIN)  IV, metoCLOPramide (REGLAN) injection, metoprolol tartrate, ondansetron **OR** ondansetron (ZOFRAN) IV, phenol, prochlorperazine, simethicone   Allergies as of 04/10/2017  . (No Known Allergies)    Family History  Problem Relation Age of Onset  . Hypertension Mother   . Macular degeneration Mother        legally blind  . Heart Problems Mother        skips a beat  . Heart attack Father   . Leukemia Father   . Colon cancer Neg Hx   . Rectal cancer Neg Hx   . Throat cancer Neg Hx   . Esophageal cancer Neg Hx   . Stomach  cancer Neg Hx     Social History   Socioeconomic History  . Marital status: Married    Spouse name: Not on file  . Number of children: 2  . Years of education: Not on file  . Highest education level: Not on file  Social Needs  . Financial resource strain: Not on file  . Food insecurity - worry: Not on file  . Food insecurity - inability: Not on file  . Transportation needs - medical: Not on file  . Transportation needs - non-medical: Not on file  Occupational History  . Occupation: retired  Tobacco Use  . Smoking status: Never Smoker  . Smokeless tobacco: Never Used  Substance and Sexual Activity  . Alcohol use: No  . Drug use: No  . Sexual activity: Not on file  Other Topics Concern  . Not on file  Social History Narrative   4 grandsons, 5 great grands    REVIEW OF SYSTEMS: Constitutional: Weakness, some fatigue but this is not profound.  Weakness has improved after IV fluids. ENT:  No nose bleeds.  No discharge Pulm:  Per HPI CV:  No palpitations, no LE edema.  GU:  No hematuria, no frequency GI:  Per HPI Heme: Denies excessive or unusual bleeding/bruising Transfusions: None Neuro:  No headaches, no peripheral tingling or numbness Derm:  No itching, no rash or sores.  Endocrine:  No sweats or chills.  No  polyuria or dysuria Immunization: Did not inquire as to recent immunizations. Travel:  None beyond local counties in last few months.    PHYSICAL EXAM: Vital signs in last 24 hours: Vitals:   04/11/17 2248 04/12/17 0635  BP: 129/68 (!) 106/57  Pulse: 69 64  Resp: 18 18  Temp: 98 F (36.7 C) 97.6 F (36.4 C)  SpO2: 96% 97%   Wt Readings from Last 3 Encounters:  04/11/17 60.1 kg (132 lb 7.9 oz)  02/16/17 77.4 kg (170 lb 10.2 oz)  01/31/17 72.1 kg (159 lb)    General: Very pleasant, alert.  She looks well, surprisingly so given history. Head: No facial asymmetry or swelling.  No signs of head trauma. Eyes: No conjunctival pallor.  No scleral icterus. Ears: Not hard of hearing Nose: No congestion or discharge. Mouth: Oral mucosa moist, pink, clear.  Tongue midline. Neck: No JVD, no masses, no thyromegaly. Lungs: Clear bilaterally.  No cough.  No labored breathing. Heart: RRR.  No MRG.  S1, S2 present. Abdomen: Soft.  Nontender.  No masses, no HSM.  Bowel sounds active.  No distention.  Laparoscopic surgical scars are hardly visible..   Rectal: Deferred Musc/Skeltl: No joint erythema, swelling or significant deformity. Extremities: No CCE. Neurologic: Alert.  Fully oriented x3.  Moves all 4 limbs, strength not tested.  No tremor.  No gross deficits.  Excellent historian. Skin: No rashes, no sores, no telangiectasia. Tattoos: None Nodes: No cervical adenopathy. Psych: Calm, cooperative.  Very pleasant demeanor.    Intake/Output from previous day: 01/30 0701 - 01/31 0700 In: 16392.4 [P.O.:800.7; I.V.:4041.7; IV Piggyback:11550] Out: 925 [Urine:925] Intake/Output this shift: No intake/output data recorded.  LAB RESULTS: Recent Labs    04/10/17 1846  WBC 10.5  HGB 14.1  HCT 41.0  PLT 418*   BMET Lab Results  Component Value Date   NA 138 04/12/2017   NA 139 04/10/2017   NA 142 02/18/2017   K 3.0 (L) 04/12/2017   K 2.7 (LL) 04/10/2017   K 3.7 02/18/2017   CL  99 (  L) 04/12/2017   CL 93 (L) 04/10/2017   CL 108 02/18/2017   CO2 30 04/12/2017   CO2 31 04/10/2017   CO2 26 02/18/2017   GLUCOSE 129 (H) 04/12/2017   GLUCOSE 127 (H) 04/10/2017   GLUCOSE 153 (H) 02/18/2017   BUN 5 (L) 04/12/2017   BUN 13 04/10/2017   BUN 10 02/18/2017   CREATININE 0.44 04/12/2017   CREATININE 0.60 04/10/2017   CREATININE 0.45 02/18/2017   CALCIUM 8.6 (L) 04/12/2017   CALCIUM 9.5 04/10/2017   CALCIUM 9.3 02/18/2017   LFT Recent Labs    04/10/17 1846  PROT 7.1  ALBUMIN 3.3*  AST 21  ALT 14  ALKPHOS 82  BILITOT 1.5*     RADIOLOGY STUDIES: Ct Chest W Contrast  Result Date: 04/11/2017 CLINICAL DATA:  Status post hiatal hernia repair with anastomotic breakdown. EXAM: CT CHEST AND ABDOMEN WITH CONTRAST TECHNIQUE: Multidetector CT imaging of the chest and abdomen was performed following the standard protocol during bolus administration of intravenous contrast. CONTRAST:  10mL ISOVUE-300 IOPAMIDOL (ISOVUE-300) INJECTION 61%, ISOVUE-300 IOPAMIDOL (ISOVUE-300) INJECTION 61% COMPARISON:  Swallow study from earlier today. FINDINGS: CT CHEST FINDINGS Cardiovascular: Normal heart size. No pericardial effusion. Mild aortic atherosclerosis. Mediastinum/Nodes: The trachea appears patent and is midline. Dilated and contrast esophagus containing identified. Postoperative changes from hiatal hernia repair are identified at the level of the GE junction. There is surrounding fluid, inflammation and extraluminal enteric contrast material and gas identified. Although most of the enteric contrast material passes into the gastric fundus the extravasated contrast material from swallow study can be seen within the surrounding soft tissues just below the diaphragmatic hiatus, image 98 of series 6 and image 45 of series 2. Lungs/Pleura: A small left pleural effusion is identified. Subsegmental atelectasis noted within the medial right lower lobe. Musculoskeletal: No suspicious bone  lesions. CT ABDOMEN FINDINGS Hepatobiliary: Mild hepatic steatosis. Focal area of low attenuation within the medial segment of left lobe of liver adjacent to the falciform ligament measures 4 cm, image 17 of series 7. Previous cholecystectomy. No biliary dilatation. Pancreas: Unremarkable. No pancreatic ductal dilatation or surrounding inflammatory changes. Spleen: Normal in size without focal abnormality. Adrenals/Urinary Tract: The adrenal glands appear normal. Unremarkable appearance of both kidneys. Stomach/Bowel: Postsurgical changes from previous hiatal hernia repair noted. Just below the diaphragmatic hiatus there is evidence of posterior extravasation of contrast material and extraluminal gas with surrounding inflammatory changes. The visualized upper abdominal bowel loops are nondilated. Vascular/Lymphatic: Aortic atherosclerosis. No aneurysm. No upper abdominal adenopathy. Other: None Musculoskeletal: Scoliosis and degenerative disc disease within the lumbar spine. IMPRESSION: 1. Examination again demonstrates extravasation of contrast material just below the level of the diaphragmatic hiatus from the Nissen fundoplication site compatible with breakdown of hiatal hernia repair. On the sagittal images the extravasation appears to extend posteriorly. On the coronal images there is lateral and medial extravasation of contrast. Emergent results were called by telephone at the time of interpretation on 04/11/2017 at 5:40 pm to Dr. Karie Soda , who verbally acknowledged these results. 2. Hepatic steatosis. Focal low-attenuation area within the medial left lobe of liver measures 4 mm. Indeterminate but favored to represent an area of focal fatty deposition. Suggest followup imaging with liver protocol MRI to confirm. 3. Small left effusion. 4.  Aortic Atherosclerosis (ICD10-I70.0). Electronically Signed   By: Signa Kell M.D.   On: 04/11/2017 17:42   Ct Abdomen W Contrast  Result Date: 04/11/2017 CLINICAL  DATA:  Status post hiatal hernia repair with  anastomotic breakdown. EXAM: CT CHEST AND ABDOMEN WITH CONTRAST TECHNIQUE: Multidetector CT imaging of the chest and abdomen was performed following the standard protocol during bolus administration of intravenous contrast. CONTRAST:  75mL ISOVUE-300 IOPAMIDOL (ISOVUE-300) INJECTION 61%, ISOVUE-300 IOPAMIDOL (ISOVUE-300) INJECTION 61% COMPARISON:  Swallow study from earlier today. FINDINGS: CT CHEST FINDINGS Cardiovascular: Normal heart size. No pericardial effusion. Mild aortic atherosclerosis. Mediastinum/Nodes: The trachea appears patent and is midline. Dilated and contrast esophagus containing identified. Postoperative changes from hiatal hernia repair are identified at the level of the GE junction. There is surrounding fluid, inflammation and extraluminal enteric contrast material and gas identified. Although most of the enteric contrast material passes into the gastric fundus the extravasated contrast material from swallow study can be seen within the surrounding soft tissues just below the diaphragmatic hiatus, image 98 of series 6 and image 45 of series 2. Lungs/Pleura: A small left pleural effusion is identified. Subsegmental atelectasis noted within the medial right lower lobe. Musculoskeletal: No suspicious bone lesions. CT ABDOMEN FINDINGS Hepatobiliary: Mild hepatic steatosis. Focal area of low attenuation within the medial segment of left lobe of liver adjacent to the falciform ligament measures 4 cm, image 17 of series 7. Previous cholecystectomy. No biliary dilatation. Pancreas: Unremarkable. No pancreatic ductal dilatation or surrounding inflammatory changes. Spleen: Normal in size without focal abnormality. Adrenals/Urinary Tract: The adrenal glands appear normal. Unremarkable appearance of both kidneys. Stomach/Bowel: Postsurgical changes from previous hiatal hernia repair noted. Just below the diaphragmatic hiatus there is evidence of posterior  extravasation of contrast material and extraluminal gas with surrounding inflammatory changes. The visualized upper abdominal bowel loops are nondilated. Vascular/Lymphatic: Aortic atherosclerosis. No aneurysm. No upper abdominal adenopathy. Other: None Musculoskeletal: Scoliosis and degenerative disc disease within the lumbar spine. IMPRESSION: 1. Examination again demonstrates extravasation of contrast material just below the level of the diaphragmatic hiatus from the Nissen fundoplication site compatible with breakdown of hiatal hernia repair. On the sagittal images the extravasation appears to extend posteriorly. On the coronal images there is lateral and medial extravasation of contrast. Emergent results were called by telephone at the time of interpretation on 04/11/2017 at 5:40 pm to Dr. Karie Soda , who verbally acknowledged these results. 2. Hepatic steatosis. Focal low-attenuation area within the medial left lobe of liver measures 4 mm. Indeterminate but favored to represent an area of focal fatty deposition. Suggest followup imaging with liver protocol MRI to confirm. 3. Small left effusion. 4.  Aortic Atherosclerosis (ICD10-I70.0). Electronically Signed   By: Signa Kell M.D.   On: 04/11/2017 17:42   Dg Abd Acute W/chest  Result Date: 04/10/2017 CLINICAL DATA:  Unresolved dysphagia since a hiatal hernia repair in November of 2018. Recent admission in December of 2018 for the same. EXAM: DG ABDOMEN ACUTE W/ 1V CHEST COMPARISON:  Chest and abdomen 02/16/2017 FINDINGS: Normal heart size. No pulmonary vascular congestion or edema. No focal consolidation. Mild scarring or atelectasis in the left lung base. No blunting of costophrenic angles. No pneumothorax. Scattered gas and stool in the colon. No small or large bowel distention. No free intra-abdominal air. No abnormal air-fluid levels. Surgical clips in the right upper quadrant. No radiopaque stones. Degenerative changes in the spine. Vascular  calcifications. IMPRESSION: No evidence of active pulmonary disease. Mild scarring or atelectasis in the left lung base. Normal nonobstructive bowel gas pattern. Electronically Signed   By: Burman Nieves M.D.   On: 04/10/2017 22:42   Dg Esophagus W/water Sol Cm  Result Date: 04/11/2017 CLINICAL DATA:  Dysphagia.  Recent hiatal hernia repair. EXAM: ESOPHOGRAM TECHNIQUE: Single contrast examination was performed using water soluble contrast. FLUOROSCOPY TIME:  Fluoroscopy Time:  2.9 minutes Radiation Exposure Index (if provided by the fluoroscopic device): 47.70 mGy Number of Acquired Spot Images: 0 COMPARISON:  Esophagrams dated 02/01/2017 and 02/17/2017 FINDINGS: The patient took small sips of water soluble contrast in upright position. There is a visible leak from the distal esophagus at the level of the diaphragm extending to the right of midline. There is also marked narrowing of esophagus just below this leak but some contrast does pass through the area of narrowing into the distal esophagus and into the stomach. There appears to be a communication of the distal esophageal leak back into the fundus of the stomach to the right of the esophagus. IMPRESSION: 1. Extravasation of contrast from the distal esophagus to a the right of midline and extending around the Nissen fundoplication. Contrast also appears to have a second communication from the area of extravasation back into the fundus of the stomach. 2. There are 2 areas of narrowing of the esophagus, one just distal to the extravasation and one at the gastroesophageal junction. Electronically Signed   By: Francene Boyers M.D.   On: 04/11/2017 09:23     IMPRESSION:   *  S/p 01/31/17 Nissen fundoplication, paraesophageal hernia repair.  Problems with dysphagia since then.  Now with failed fundoplication and suspicion for contained leak. Dr Michaell Cowing started on Zosyn and 72 hours IV Decadron.  Fortunately she does not have fever or other indications of  sepsis. He wonders about placement of covered esophageal stent and is hoping that with conservative mgt he can avoid re-operation.    *   Hypokalemia. Oral liquid supplement ordered per surgeon.     *   Fatty liver.  Focal area of low attenuation on CT scan, suspected area of focal fat.  *   Small left pleural effusion.  Asymptomatic.   PLAN:     *   Patient needs her nutritional status addressed.  Possibilities include central line and initiate TNA versus temporary feeding gastrostomy tube.  *  Dr Christella Hartigan planning egd and stent placement this afternoon.     Jennye Moccasin  04/12/2017, 9:30 AM Pager: [phone]  ________________________________________________________________________  Corinda Gubler GI MD note:  I personally examined the patient, reviewed the data and agree with the assessment and plan described above.  Breakdown with extravasation from distal esophagus, region of [facility] repair.  I discussed with Dr. Michaell Cowing and agree that covered esophageal stent placement is a reasonable plan given her situation. She understands the stent may not help and in itself caries risk (perforation, bleeding, migration among the more serious risks). The alternative is re-operating which carries significant risk as well.  Planning on stent placement today.   Rob Bunting, MD [facility] Pager [phone]

## 2017-04-12 NOTE — Anesthesia Preprocedure Evaluation (Signed)
Anesthesia Evaluation  Patient identified by MRN, date of birth, ID band Patient awake    Reviewed: Allergy & Precautions, NPO status , Patient's Chart, lab work & pertinent test results  Airway Mallampati: II  TM Distance: >3 FB Neck ROM: Full    Dental no notable dental hx.    Pulmonary neg pulmonary ROS,    Pulmonary exam normal breath sounds clear to auscultation       Cardiovascular hypertension, Pt. on medications Normal cardiovascular exam Rhythm:Regular Rate:Normal  EKG 09-27-16 on chart from [facility] care. SR, rate 67   Neuro/Psych negative neurological ROS  negative psych ROS   GI/Hepatic Neg liver ROS, hiatal hernia, GERD  Medicated,PARAESOPHAGEAL HIATAL HERNIA WITH ORGANOAXIAL VOLVULUS   Endo/Other  negative endocrine ROS  Renal/GU negative Renal ROS     Musculoskeletal negative musculoskeletal ROS (+)   Abdominal (+) + obese,   Peds  Hematology negative hematology ROS (+)   Anesthesia Other Findings   Reproductive/Obstetrics                             Anesthesia Physical  Anesthesia Plan  ASA: II  Anesthesia Plan: MAC   Post-op Pain Management:    Induction: Intravenous  PONV Risk Score and Plan: 2 and Ondansetron, Dexamethasone and Treatment may vary due to age or medical condition  Airway Management Planned: Nasal Cannula  Additional Equipment:   Intra-op Plan:   Post-operative Plan:   Informed Consent: I have reviewed the patients History and Physical, chart, labs and discussed the procedure including the risks, benefits and alternatives for the proposed anesthesia with the patient or authorized representative who has indicated his/her understanding and acceptance.   Dental advisory given  Plan Discussed with: CRNA  Anesthesia Plan Comments:         Anesthesia Quick Evaluation

## 2017-04-12 NOTE — H&P (View-Only) (Signed)
                                                                           [facility] Consult: 9:30 AM 04/12/2017  LOS: 2 days    Referring Provider: Dr Gross  Primary Care Physician:  Howard, Kevin, MD Primary Gastroenterologist:  Dr. Danis    Reason for Consultation:  Complications after fundoplication.     HPI: Sydney [name] is a 71 y.o. female.  Hx obesity.  HTN. Previous colonoscopy x 2 in [place] (last ~ 10 yr ago) with finding of hemorrhoids.  Constipation.      GERD.  Large HH.   EGD 11/16/16:  Large HH, 2/3rds to 3/4 of stomach is intrathoracic. GES 12/08/16: Normal 4 hour study S/p 01/31/17 1.RoboticLaparoscopic reduction of paraesophageal hiatal hernia 2. Type II mediastinal dissection.  3. Primary repair of hiatal hernia over pledgets.  3.  5 Mesh reinforcement (Phasix) of hiatal hernia repair  4. Anterior & posterior gastropexy.  5. Nissen fundoplication 2 cm over a 56-French bougie Esophagram #1 02/01/17:   Swelling in region of fundoplication with associated GEJx narrowing.  Mild esophageal retention of swallowed contrast, clears after dry swallows.  No leak. Treated with IV steroids.   Discharge POD 2 on puree diet.     Readmitted 12/7 - 02/20/17 due to dysphagia, FTT.   Esophagram #2 02/17/17:   Progressive near-obstructive complex narrowing of the distal esophageal lumen at the level of the fundoplication. No leak Treated with IV abx, IV steroids and discharged on thin liquid diet.  Zantac 300 mg BID.    Has never tolerated solids.  Progressive dysphagia and recently only able to take small sips of liquids..  Liquid PO quickly leads to regurgitation.  Bad taste in mouth.  No nausea, no abdominal or chest pain.  No sweats or chills.  No dyspnea, infact the surgery led to improvement in her breathing. Treated constipation with laxatives and had limited diarrhea.   Treated hemorrhoidal swelling and pain with Preparation H.  As of last Friday, after phone call to surgeon, he RXd Decadron but this was not filled by pt.  Seen at office Tuesday 1/29 and admitted from there.  A year ago she weighed 167 pounds.  Currently weighs 132 pounds.  Esophagram #3 04/11/17:   1. Extravasation of contrast from the distal esophagus to the right of midline and extending around the Nissen fundoplication. Contrast also appears to have a second communication from the area of extravasation back into the fundus of the stomach. 2. There are 2 areas of narrowing of the esophagus, one just distal to the extravasation and one at the gastroesophageal junction. CT abdomen 04/11/17:   extravasation of contrast material just below the level of the diaphragmatic hiatus from the fundoplication site compatible with breakdown of hiatal hernia repair. On the sagittal images the extravasation appears to extend posteriorly. On the coronal images there is lateral and medial extravasation of contrast. 04/10/17 AAS: no active dz.  Scarring vs atx in left base.  NOBGP  Labs with hypokalemia.  Normal WBCs and Hgb.  Hyperglycemia.     Past Medical History:  Diagnosis Date  . Arthritis   . Hiatal hernia with   obstruction but no gangrene   . Hypertension   . Obesity   . 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 Seasonal allergies   . Thyroid nodule   . Vertigo     Past Surgical History:  Procedure Laterality Date  . CATARACT EXTRACTION, BILATERAL  2016  . CHOLECYSTECTOMY    . ESOPHAGEAL MANOMETRY N/A 01/03/2017   Procedure: ESOPHAGEAL MANOMETRY (EM);  Surgeon: Nandigam, Kavitha V, MD;  Location: [facility];  Service: Endoscopy;  Laterality: N/A;  . INSERTION OF MESH N/A 01/31/2017   Procedure: INSERTION OF MESH;  Surgeon: Gross, Steven, MD;  Location: [facility];  Service: General;  Laterality: N/A;  . MOUTH SURGERY    . TUBAL LIGATION      Prior to Admission medications   Medication  Sig Start Date End Date Taking? Authorizing Provider  acetaminophen (TYLENOL) 500 MG tablet Take 500 mg by mouth every 4 (four) hours as needed for mild pain or headache (for pain.).    Yes [provider]  ondansetron (ZOFRAN) 4 MG tablet Take 1 tablet (4 mg total) by mouth every 8 (eight) hours as needed for nausea. 02/20/17  Yes Gross, Steven, MD  promethazine (PHENERGAN) 25 MG suppository Place 1 suppository (25 mg total) rectally every 6 (six) hours as needed for nausea. 01/31/17  Yes Gross, Steven, MD  chlorthalidone (HYGROTON) 25 MG tablet Take 25 mg daily as needed by mouth (for fluid retention/swelling.).     [provider]  ranitidine (ZANTAC) 300 MG tablet Take 300 mg by mouth 2 (two) times daily as needed for heartburn.     [provider]  simethicone (MYLICON) 40 MG/0.6ML drops Take 0.6 mLs (40 mg total) by mouth 4 (four) times daily. Patient not taking: Reported on 04/10/2017 02/20/17   Gross, Steven, MD    Scheduled Meds: . chlorhexidine  15 mL Mouth Rinse BID  . dexamethasone  4 mg Intravenous Q12H  . enoxaparin (LOVENOX) injection  40 mg Subcutaneous Q24H  . feeding supplement  1 Container Oral BID BM  . lip balm  1 application Topical BID  . mouth rinse  15 mL Mouth Rinse q12n4p  . ondansetron  4 mg Intravenous Q6H  . potassium chloride  60 mEq Oral Once  . simethicone  40 mg Oral QID   Infusions: . chlorproMAZINE (THORAZINE) IV    . famotidine (PEPCID) IV 20 mg (04/11/17 2106)  . lactated ringers    . lactated ringers 1,000 mL (04/11/17 1951)  . lactated ringers 125 mL/hr at 04/11/17 0959  . methocarbamol (ROBAXIN)  IV    . piperacillin-tazobactam (ZOSYN)  IV 3.375 g (04/12/17 0257)   PRN Meds: acetaminophen **OR** acetaminophen, alum & mag hydroxide-simeth, bisacodyl, chlorproMAZINE (THORAZINE) IV, diphenhydrAMINE **OR** diphenhydrAMINE, fentaNYL (SUBLIMAZE) injection, guaiFENesin-dextromethorphan, hydrALAZINE, hydrocortisone,  hydrocortisone cream, hydrocortisone-pramoxine, lactated ringers, magic mouthwash, menthol-cetylpyridinium, methocarbamol (ROBAXIN)  IV, metoCLOPramide (REGLAN) injection, metoprolol tartrate, ondansetron **OR** ondansetron (ZOFRAN) IV, phenol, prochlorperazine, simethicone   Allergies as of 04/10/2017  . (No Known Allergies)    Family History  Problem Relation Age of Onset  . Hypertension Mother   . Macular degeneration Mother        legally blind  . Heart Problems Mother        skips a beat  . Heart attack Father   . Leukemia Father   . Colon cancer Neg Hx   . Rectal cancer Neg Hx   . Throat cancer Neg Hx   . Esophageal cancer Neg Hx   . Stomach   cancer Neg Hx     Social History   Socioeconomic History  . Marital status: Married    Spouse name: Not on file  . Number of children: 2  . Years of education: Not on file  . Highest education level: Not on file  Social Needs  . Financial resource strain: Not on file  . Food insecurity - worry: Not on file  . Food insecurity - inability: Not on file  . Transportation needs - medical: Not on file  . Transportation needs - non-medical: Not on file  Occupational History  . Occupation: retired  Tobacco Use  . Smoking status: Never Smoker  . Smokeless tobacco: Never Used  Substance and Sexual Activity  . Alcohol use: No  . Drug use: No  . Sexual activity: Not on file  Other Topics Concern  . Not on file  Social History Narrative   4 grandsons, 5 great grands    REVIEW OF SYSTEMS: Constitutional: Weakness, some fatigue but this is not profound.  Weakness has improved after IV fluids. ENT:  No nose bleeds.  No discharge Pulm:  Per HPI CV:  No palpitations, no LE edema.  GU:  No hematuria, no frequency GI:  Per HPI Heme: Denies excessive or unusual bleeding/bruising Transfusions: None Neuro:  No headaches, no peripheral tingling or numbness Derm:  No itching, no rash or sores.  Endocrine:  No sweats or chills.  No  polyuria or dysuria Immunization: Did not inquire as to recent immunizations. Travel:  None beyond local counties in last few months.    PHYSICAL EXAM: Vital signs in last 24 hours: Vitals:   04/11/17 2248 04/12/17 0635  BP: 129/68 (!) 106/57  Pulse: 69 64  Resp: 18 18  Temp: 98 F (36.7 C) 97.6 F (36.4 C)  SpO2: 96% 97%   Wt Readings from Last 3 Encounters:  04/11/17 60.1 kg (132 lb 7.9 oz)  02/16/17 77.4 kg (170 lb 10.2 oz)  01/31/17 72.1 kg (159 lb)    General: Very pleasant, alert.  She looks well, surprisingly so given history. Head: No facial asymmetry or swelling.  No signs of head trauma. Eyes: No conjunctival pallor.  No scleral icterus. Ears: Not hard of hearing Nose: No congestion or discharge. Mouth: Oral mucosa moist, pink, clear.  Tongue midline. Neck: No JVD, no masses, no thyromegaly. Lungs: Clear bilaterally.  No cough.  No labored breathing. Heart: RRR.  No MRG.  S1, S2 present. Abdomen: Soft.  Nontender.  No masses, no HSM.  Bowel sounds active.  No distention.  Laparoscopic surgical scars are hardly visible..   Rectal: Deferred Musc/Skeltl: No joint erythema, swelling or significant deformity. Extremities: No CCE. Neurologic: Alert.  Fully oriented x3.  Moves all 4 limbs, strength not tested.  No tremor.  No gross deficits.  Excellent historian. Skin: No rashes, no sores, no telangiectasia. Tattoos: None Nodes: No cervical adenopathy. Psych: Calm, cooperative.  Very pleasant demeanor.    Intake/Output from previous day: 01/30 0701 - 01/31 0700 In: 16392.4 [P.O.:800.7; I.V.:4041.7; IV Piggyback:11550] Out: 925 [Urine:925] Intake/Output this shift: No intake/output data recorded.  LAB RESULTS: Recent Labs    04/10/17 1846  WBC 10.5  HGB 14.1  HCT 41.0  PLT 418*   BMET Lab Results  Component Value Date   NA 138 04/12/2017   NA 139 04/10/2017   NA 142 02/18/2017   K 3.0 (L) 04/12/2017   K 2.7 (LL) 04/10/2017   K 3.7 02/18/2017   CL  99 (  L) 04/12/2017   CL 93 (L) 04/10/2017   CL 108 02/18/2017   CO2 30 04/12/2017   CO2 31 04/10/2017   CO2 26 02/18/2017   GLUCOSE 129 (H) 04/12/2017   GLUCOSE 127 (H) 04/10/2017   GLUCOSE 153 (H) 02/18/2017   BUN 5 (L) 04/12/2017   BUN 13 04/10/2017   BUN 10 02/18/2017   CREATININE 0.44 04/12/2017   CREATININE 0.60 04/10/2017   CREATININE 0.45 02/18/2017   CALCIUM 8.6 (L) 04/12/2017   CALCIUM 9.5 04/10/2017   CALCIUM 9.3 02/18/2017   LFT Recent Labs    04/10/17 1846  PROT 7.1  ALBUMIN 3.3*  AST 21  ALT 14  ALKPHOS 82  BILITOT 1.5*     RADIOLOGY STUDIES: Ct Chest W Contrast  Result Date: 04/11/2017 CLINICAL DATA:  Status post hiatal hernia repair with anastomotic breakdown. EXAM: CT CHEST AND ABDOMEN WITH CONTRAST TECHNIQUE: Multidetector CT imaging of the chest and abdomen was performed following the standard protocol during bolus administration of intravenous contrast. CONTRAST:  10mL ISOVUE-300 IOPAMIDOL (ISOVUE-300) INJECTION 61%, 100mL ISOVUE-300 IOPAMIDOL (ISOVUE-300) INJECTION 61% COMPARISON:  Swallow study from earlier today. FINDINGS: CT CHEST FINDINGS Cardiovascular: Normal heart size. No pericardial effusion. Mild aortic atherosclerosis. Mediastinum/Nodes: The trachea appears patent and is midline. Dilated and contrast esophagus containing identified. Postoperative changes from hiatal hernia repair are identified at the level of the GE junction. There is surrounding fluid, inflammation and extraluminal enteric contrast material and gas identified. Although most of the enteric contrast material passes into the gastric fundus the extravasated contrast material from swallow study can be seen within the surrounding soft tissues just below the diaphragmatic hiatus, image 98 of series 6 and image 45 of series 2. Lungs/Pleura: A small left pleural effusion is identified. Subsegmental atelectasis noted within the medial right lower lobe. Musculoskeletal: No suspicious bone  lesions. CT ABDOMEN FINDINGS Hepatobiliary: Mild hepatic steatosis. Focal area of low attenuation within the medial segment of left lobe of liver adjacent to the falciform ligament measures 4 cm, image 17 of series 7. Previous cholecystectomy. No biliary dilatation. Pancreas: Unremarkable. No pancreatic ductal dilatation or surrounding inflammatory changes. Spleen: Normal in size without focal abnormality. Adrenals/Urinary Tract: The adrenal glands appear normal. Unremarkable appearance of both kidneys. Stomach/Bowel: Postsurgical changes from previous hiatal hernia repair noted. Just below the diaphragmatic hiatus there is evidence of posterior extravasation of contrast material and extraluminal gas with surrounding inflammatory changes. The visualized upper abdominal bowel loops are nondilated. Vascular/Lymphatic: Aortic atherosclerosis. No aneurysm. No upper abdominal adenopathy. Other: None Musculoskeletal: Scoliosis and degenerative disc disease within the lumbar spine. IMPRESSION: 1. Examination again demonstrates extravasation of contrast material just below the level of the diaphragmatic hiatus from the Nissen fundoplication site compatible with breakdown of hiatal hernia repair. On the sagittal images the extravasation appears to extend posteriorly. On the coronal images there is lateral and medial extravasation of contrast. Emergent results were called by telephone at the time of interpretation on 04/11/2017 at 5:40 pm to Dr. STEVEN GROSS , who verbally acknowledged these results. 2. Hepatic steatosis. Focal low-attenuation area within the medial left lobe of liver measures 4 mm. Indeterminate but favored to represent an area of focal fatty deposition. Suggest followup imaging with liver protocol MRI to confirm. 3. Small left effusion. 4.  Aortic Atherosclerosis (ICD10-I70.0). Electronically Signed   By: Taylor  Stroud M.D.   On: 04/11/2017 17:42   Ct Abdomen W Contrast  Result Date: 04/11/2017 CLINICAL  DATA:  Status post hiatal hernia repair with   anastomotic breakdown. EXAM: CT CHEST AND ABDOMEN WITH CONTRAST TECHNIQUE: Multidetector CT imaging of the chest and abdomen was performed following the standard protocol during bolus administration of intravenous contrast. CONTRAST:  10mL ISOVUE-300 IOPAMIDOL (ISOVUE-300) INJECTION 61%, 100mL ISOVUE-300 IOPAMIDOL (ISOVUE-300) INJECTION 61% COMPARISON:  Swallow study from earlier today. FINDINGS: CT CHEST FINDINGS Cardiovascular: Normal heart size. No pericardial effusion. Mild aortic atherosclerosis. Mediastinum/Nodes: The trachea appears patent and is midline. Dilated and contrast esophagus containing identified. Postoperative changes from hiatal hernia repair are identified at the level of the GE junction. There is surrounding fluid, inflammation and extraluminal enteric contrast material and gas identified. Although most of the enteric contrast material passes into the gastric fundus the extravasated contrast material from swallow study can be seen within the surrounding soft tissues just below the diaphragmatic hiatus, image 98 of series 6 and image 45 of series 2. Lungs/Pleura: A small left pleural effusion is identified. Subsegmental atelectasis noted within the medial right lower lobe. Musculoskeletal: No suspicious bone lesions. CT ABDOMEN FINDINGS Hepatobiliary: Mild hepatic steatosis. Focal area of low attenuation within the medial segment of left lobe of liver adjacent to the falciform ligament measures 4 cm, image 17 of series 7. Previous cholecystectomy. No biliary dilatation. Pancreas: Unremarkable. No pancreatic ductal dilatation or surrounding inflammatory changes. Spleen: Normal in size without focal abnormality. Adrenals/Urinary Tract: The adrenal glands appear normal. Unremarkable appearance of both kidneys. Stomach/Bowel: Postsurgical changes from previous hiatal hernia repair noted. Just below the diaphragmatic hiatus there is evidence of posterior  extravasation of contrast material and extraluminal gas with surrounding inflammatory changes. The visualized upper abdominal bowel loops are nondilated. Vascular/Lymphatic: Aortic atherosclerosis. No aneurysm. No upper abdominal adenopathy. Other: None Musculoskeletal: Scoliosis and degenerative disc disease within the lumbar spine. IMPRESSION: 1. Examination again demonstrates extravasation of contrast material just below the level of the diaphragmatic hiatus from the Nissen fundoplication site compatible with breakdown of hiatal hernia repair. On the sagittal images the extravasation appears to extend posteriorly. On the coronal images there is lateral and medial extravasation of contrast. Emergent results were called by telephone at the time of interpretation on 04/11/2017 at 5:40 pm to Dr. STEVEN GROSS , who verbally acknowledged these results. 2. Hepatic steatosis. Focal low-attenuation area within the medial left lobe of liver measures 4 mm. Indeterminate but favored to represent an area of focal fatty deposition. Suggest followup imaging with liver protocol MRI to confirm. 3. Small left effusion. 4.  Aortic Atherosclerosis (ICD10-I70.0). Electronically Signed   By: Taylor  Stroud M.D.   On: 04/11/2017 17:42   Dg Abd Acute W/chest  Result Date: 04/10/2017 CLINICAL DATA:  Unresolved dysphagia since a hiatal hernia repair in November of 2018. Recent admission in December of 2018 for the same. EXAM: DG ABDOMEN ACUTE W/ 1V CHEST COMPARISON:  Chest and abdomen 02/16/2017 FINDINGS: Normal heart size. No pulmonary vascular congestion or edema. No focal consolidation. Mild scarring or atelectasis in the left lung base. No blunting of costophrenic angles. No pneumothorax. Scattered gas and stool in the colon. No small or large bowel distention. No free intra-abdominal air. No abnormal air-fluid levels. Surgical clips in the right upper quadrant. No radiopaque stones. Degenerative changes in the spine. Vascular  calcifications. IMPRESSION: No evidence of active pulmonary disease. Mild scarring or atelectasis in the left lung base. Normal nonobstructive bowel gas pattern. Electronically Signed   By: William  Stevens M.D.   On: 04/10/2017 22:42   Dg Esophagus W/water Sol Cm  Result Date: 04/11/2017 CLINICAL DATA:  Dysphagia.    Recent hiatal hernia repair. EXAM: ESOPHOGRAM TECHNIQUE: Single contrast examination was performed using water soluble contrast. FLUOROSCOPY TIME:  Fluoroscopy Time:  2.9 minutes Radiation Exposure Index (if provided by the fluoroscopic device): 47.70 mGy Number of Acquired Spot Images: 0 COMPARISON:  Esophagrams dated 02/01/2017 and 02/17/2017 FINDINGS: The patient took small sips of water soluble contrast in upright position. There is a visible leak from the distal esophagus at the level of the diaphragm extending to the right of midline. There is also marked narrowing of esophagus just below this leak but some contrast does pass through the area of narrowing into the distal esophagus and into the stomach. There appears to be a communication of the distal esophageal leak back into the fundus of the stomach to the right of the esophagus. IMPRESSION: 1. Extravasation of contrast from the distal esophagus to a the right of midline and extending around the Nissen fundoplication. Contrast also appears to have a second communication from the area of extravasation back into the fundus of the stomach. 2. There are 2 areas of narrowing of the esophagus, one just distal to the extravasation and one at the gastroesophageal junction. Electronically Signed   By: James  Maxwell M.D.   On: 04/11/2017 09:23     IMPRESSION:   *  S/p 01/31/17 Nissen fundoplication, paraesophageal hernia repair.  Problems with dysphagia since then.  Now with failed fundoplication and suspicion for contained leak. Dr Gross started on Zosyn and 72 hours IV Decadron.  Fortunately she does not have fever or other indications of  sepsis. He wonders about placement of covered esophageal stent and is hoping that with conservative mgt he can avoid re-operation.    *   Hypokalemia. Oral liquid supplement ordered per surgeon.     *   Fatty liver.  Focal area of low attenuation on CT scan, suspected area of focal fat.  *   Small left pleural effusion.  Asymptomatic.   PLAN:     *   Patient needs her nutritional status addressed.  Possibilities include central line and initiate TNA versus temporary feeding gastrostomy tube.  *  Dr Epifania Littrell planning egd and stent placement this afternoon.     Sydney [name]  04/12/2017, 9:30 AM Pager: [phone]  ________________________________________________________________________  [facility] MD note:  I personally examined the patient, reviewed the data and agree with the assessment and plan described above.  Breakdown with extravasation from distal esophagus, region of HH repair.  I discussed with Dr. Gross and agree that covered esophageal stent placement is a reasonable plan given her situation. She understands the stent may not help and in itself caries risk (perforation, bleeding, migration among the more serious risks). The alternative is re-operating which carries significant risk as well.  Planning on stent placement today.   Sydney Shaddix, MD [facility] Pager [phone]    

## 2017-04-13 ENCOUNTER — Encounter (HOSPITAL_COMMUNITY)
Admission: AD | Disposition: A | Discharge status: Home / Self Care | Payer: Self-pay | Source: Ambulatory Visit | Attending: Surgery

## 2017-04-13 ENCOUNTER — Encounter (HOSPITAL_COMMUNITY): Payer: Self-pay | Admitting: Gastroenterology

## 2017-04-13 ENCOUNTER — Inpatient Hospital Stay (HOSPITAL_COMMUNITY): Payer: Medicare Other | Admitting: Certified Registered Nurse Anesthetist

## 2017-04-13 DIAGNOSIS — Z931 Gastrostomy status: Secondary | ICD-10-CM

## 2017-04-13 HISTORY — PX: STERIOD INJECTION: SHX5046

## 2017-04-13 HISTORY — PX: ESOPHAGEAL STENT PLACEMENT: SHX5540

## 2017-04-13 HISTORY — PX: ESOPHAGOGASTRODUODENOSCOPY: SHX5428

## 2017-04-13 HISTORY — PX: FOREIGN BODY RETRIEVAL: CATH118241

## 2017-04-13 HISTORY — PX: LAPAROSCOPIC ROUX-EN-Y GASTRIC BYPASS WITH UPPER ENDOSCOPY AND REMOVAL OF LAP BAND: SHX6505

## 2017-04-13 HISTORY — PX: LAPAROSCOPIC NISSEN FUNDOPLICATION: SHX1932

## 2017-04-13 LAB — MRSA PCR SCREENING: MRSA by PCR: NEGATIVE

## 2017-04-13 LAB — GLUCOSE, CAPILLARY: Glucose-Capillary: 123 mg/dL — ABNORMAL HIGH (ref 65–99)

## 2017-04-13 SURGERY — FUNDOPLICATION, NISSEN, LAPAROSCOPIC
Anesthesia: General | Site: Esophagus

## 2017-04-13 MED ORDER — SUGAMMADEX SODIUM 200 MG/2ML IV SOLN
INTRAVENOUS | Status: AC
  Filled 2017-04-13: qty 2

## 2017-04-13 MED ORDER — FENTANYL CITRATE (PF) 100 MCG/2ML IJ SOLN: 25.0000 ug | INTRAMUSCULAR | Status: DC | PRN

## 2017-04-13 MED ORDER — LIDOCAINE 2% (20 MG/ML) 5 ML SYRINGE
INTRAMUSCULAR | Status: DC | PRN
  Administered 2017-04-13: 1.5 mg/kg/h via INTRAVENOUS

## 2017-04-13 MED ORDER — DEXAMETHASONE SODIUM PHOSPHATE 10 MG/ML IJ SOLN
INTRAMUSCULAR | Status: AC
  Filled 2017-04-13: qty 1

## 2017-04-13 MED ORDER — KETAMINE HCL 10 MG/ML IJ SOLN
INTRAMUSCULAR | Status: DC | PRN
  Administered 2017-04-13: 20 mg via INTRAVENOUS

## 2017-04-13 MED ORDER — DEXAMETHASONE SODIUM PHOSPHATE 10 MG/ML IJ SOLN
INTRAMUSCULAR | Status: DC | PRN
  Administered 2017-04-13: 10 mg via INTRAVENOUS

## 2017-04-13 MED ORDER — FENTANYL CITRATE (PF) 100 MCG/2ML IJ SOLN
25.0000 ug | INTRAMUSCULAR | Status: DC | PRN
  Administered 2017-04-13: 50 ug via INTRAVENOUS

## 2017-04-13 MED ORDER — FENTANYL CITRATE (PF) 250 MCG/5ML IJ SOLN
INTRAMUSCULAR | Status: AC
  Filled 2017-04-13: qty 5

## 2017-04-13 MED ORDER — PROPOFOL 10 MG/ML IV BOLUS
INTRAVENOUS | Status: DC | PRN
  Administered 2017-04-13: 100 mg via INTRAVENOUS

## 2017-04-13 MED ORDER — BUPIVACAINE HCL (PF) 0.5 % IJ SOLN
INTRAMUSCULAR | Status: DC | PRN
  Administered 2017-04-13: 20 mL

## 2017-04-13 MED ORDER — STERILE WATER FOR IRRIGATION IR SOLN
Status: DC | PRN
  Administered 2017-04-13: 1000 mL

## 2017-04-13 MED ORDER — BUPIVACAINE HCL (PF) 0.5 % IJ SOLN
INTRAMUSCULAR | Status: AC
  Filled 2017-04-13: qty 30

## 2017-04-13 MED ORDER — PHENYLEPHRINE 40 MCG/ML (10ML) SYRINGE FOR IV PUSH (FOR BLOOD PRESSURE SUPPORT): PREFILLED_SYRINGE | INTRAVENOUS | Status: DC | PRN

## 2017-04-13 MED ORDER — DEXTROSE 5 % IV SOLN
2.0000 g | INTRAVENOUS | Status: AC
  Administered 2017-04-13: 2 g via INTRAVENOUS
  Filled 2017-04-13 (×2): qty 2

## 2017-04-13 MED ORDER — VITAMINS A & D EX OINT
TOPICAL_OINTMENT | CUTANEOUS | Status: AC
  Administered 2017-04-13: 5
  Filled 2017-04-13: qty 5

## 2017-04-13 MED ORDER — LIDOCAINE 2% (20 MG/ML) 5 ML SYRINGE
INTRAMUSCULAR | Status: AC
  Filled 2017-04-13: qty 5

## 2017-04-13 MED ORDER — PHENYLEPHRINE HCL 10 MG/ML IJ SOLN
INTRAVENOUS | Status: DC | PRN
  Administered 2017-04-13: 50 ug/min via INTRAVENOUS

## 2017-04-13 MED ORDER — SUGAMMADEX SODIUM 200 MG/2ML IV SOLN
INTRAVENOUS | Status: DC | PRN
  Administered 2017-04-13: 150 mg via INTRAVENOUS

## 2017-04-13 MED ORDER — ONDANSETRON HCL 4 MG/2ML IJ SOLN
INTRAMUSCULAR | Status: DC | PRN
  Administered 2017-04-13: 4 mg via INTRAVENOUS

## 2017-04-13 MED ORDER — EPHEDRINE SULFATE-NACL 50-0.9 MG/10ML-% IV SOSY
PREFILLED_SYRINGE | INTRAVENOUS | Status: DC | PRN
  Administered 2017-04-13 (×2): 10 mg via INTRAVENOUS

## 2017-04-13 MED ORDER — SODIUM CHLORIDE 0.9 % IJ SOLN
INTRAMUSCULAR | Status: DC | PRN
  Administered 2017-04-13: 5 mL

## 2017-04-13 MED ORDER — FENTANYL CITRATE (PF) 100 MCG/2ML IJ SOLN
INTRAMUSCULAR | Status: DC | PRN
  Administered 2017-04-13 (×2): 50 ug via INTRAVENOUS
  Administered 2017-04-13: 100 ug via INTRAVENOUS
  Administered 2017-04-13: 50 ug via INTRAVENOUS

## 2017-04-13 MED ORDER — FENTANYL CITRATE (PF) 100 MCG/2ML IJ SOLN
INTRAMUSCULAR | Status: AC
  Administered 2017-04-13: 50 ug via INTRAVENOUS
  Filled 2017-04-13: qty 2

## 2017-04-13 MED ORDER — TRIAMCINOLONE ACETONIDE 40 MG/ML IJ SUSP
INTRAMUSCULAR | Status: AC
  Filled 2017-04-13: qty 1

## 2017-04-13 MED ORDER — EPHEDRINE 5 MG/ML INJ
INTRAVENOUS | Status: AC
  Filled 2017-04-13: qty 10

## 2017-04-13 MED ORDER — ONDANSETRON HCL 4 MG/2ML IJ SOLN
INTRAMUSCULAR | Status: AC
  Filled 2017-04-13: qty 2

## 2017-04-13 MED ORDER — TRIAMCINOLONE ACETONIDE 40 MG/ML IJ SUSP
INTRAMUSCULAR | Status: DC | PRN
  Administered 2017-04-13: 40 mg

## 2017-04-13 MED ORDER — DEXAMETHASONE SODIUM PHOSPHATE 4 MG/ML IJ SOLN
4.0000 mg | INTRAMUSCULAR | Status: DC
  Filled 2017-04-13: qty 1

## 2017-04-13 MED ORDER — PHENYLEPHRINE 40 MCG/ML (10ML) SYRINGE FOR IV PUSH (FOR BLOOD PRESSURE SUPPORT)
PREFILLED_SYRINGE | INTRAVENOUS | Status: DC | PRN
  Administered 2017-04-13: 40 ug via INTRAVENOUS

## 2017-04-13 MED ORDER — KETAMINE HCL 10 MG/ML IJ SOLN
INTRAMUSCULAR | Status: AC
  Filled 2017-04-13: qty 1

## 2017-04-13 MED ORDER — SODIUM CHLORIDE 0.9 % IJ SOLN
INTRAMUSCULAR | Status: AC
  Filled 2017-04-13: qty 10

## 2017-04-13 MED ORDER — METRONIDAZOLE IN NACL 5-0.79 MG/ML-% IV SOLN
500.0000 mg | Freq: Three times a day (TID) | INTRAVENOUS | Status: DC
  Administered 2017-04-13 – 2017-04-18 (×14): 500 mg via INTRAVENOUS
  Filled 2017-04-13 (×17): qty 100

## 2017-04-13 MED ORDER — LACTATED RINGERS IV SOLN
INTRAVENOUS | Status: DC
  Administered 2017-04-13 (×2): via INTRAVENOUS

## 2017-04-13 MED ORDER — LIDOCAINE 2% (20 MG/ML) 5 ML SYRINGE
INTRAMUSCULAR | Status: DC | PRN
  Administered 2017-04-13: 60 mg via INTRAVENOUS

## 2017-04-13 MED ORDER — PHENYLEPHRINE HCL 10 MG/ML IJ SOLN
INTRAMUSCULAR | Status: AC
  Filled 2017-04-13: qty 1

## 2017-04-13 MED ORDER — 0.9 % SODIUM CHLORIDE (POUR BTL) OPTIME
TOPICAL | Status: DC | PRN
  Administered 2017-04-13: 1000 mL

## 2017-04-13 MED ORDER — ROCURONIUM BROMIDE 10 MG/ML (PF) SYRINGE
PREFILLED_SYRINGE | INTRAVENOUS | Status: DC | PRN
  Administered 2017-04-13: 10 mg via INTRAVENOUS
  Administered 2017-04-13: 20 mg via INTRAVENOUS
  Administered 2017-04-13: 40 mg via INTRAVENOUS

## 2017-04-13 MED ORDER — SCOPOLAMINE 1 MG/3DAYS TD PT72
1.0000 | MEDICATED_PATCH | TRANSDERMAL | Status: DC
  Administered 2017-04-13: 1.5 mg via TRANSDERMAL
  Filled 2017-04-13 (×2): qty 1

## 2017-04-13 MED ORDER — LACTATED RINGERS IR SOLN
Status: DC | PRN
  Administered 2017-04-13: 1000 mL

## 2017-04-13 MED ORDER — MEPERIDINE HCL 50 MG/ML IJ SOLN: 6.2500 mg | INTRAMUSCULAR | Status: DC | PRN

## 2017-04-13 SURGICAL SUPPLY — 60 items
APPLIER CLIP 5 13 M/L LIGAMAX5 (MISCELLANEOUS) ×5
APPLIER CLIP ROT 10 11.4 M/L (STAPLE)
BAG URINE DRAINAGE (UROLOGICAL SUPPLIES) ×5 IMPLANT
CABLE HIGH FREQUENCY MONO STRZ (ELECTRODE) ×5 IMPLANT
CATH GASTROSTOMY 24FR (CATHETERS) ×5 IMPLANT
CHLORAPREP W/TINT 26ML (MISCELLANEOUS) ×5 IMPLANT
CLIP APPLIE 5 13 M/L LIGAMAX5 (MISCELLANEOUS) ×4 IMPLANT
CLIP APPLIE ROT 10 11.4 M/L (STAPLE) IMPLANT
COVER SURGICAL LIGHT HANDLE (MISCELLANEOUS) ×5 IMPLANT
DECANTER SPIKE VIAL GLASS SM (MISCELLANEOUS) ×5 IMPLANT
DERMABOND ADVANCED (GAUZE/BANDAGES/DRESSINGS) ×1
DERMABOND ADVANCED .7 DNX12 (GAUZE/BANDAGES/DRESSINGS) ×4 IMPLANT
DEVICE TROCAR PUNCTURE CLOSURE (ENDOMECHANICALS) ×5 IMPLANT
DRAIN CHANNEL 19F RND (DRAIN) ×5 IMPLANT
DRAIN PENROSE 18X1/2 LTX STRL (DRAIN) IMPLANT
DRAPE WARM FLUID 44X44 (DRAPE) ×5 IMPLANT
DRSG TEGADERM 2-3/8X2-3/4 SM (GAUZE/BANDAGES/DRESSINGS) ×15 IMPLANT
DRSG TEGADERM 4X4.75 (GAUZE/BANDAGES/DRESSINGS) ×5 IMPLANT
ELECT REM PT RETURN 15FT ADLT (MISCELLANEOUS) ×5 IMPLANT
EVACUATOR SILICONE 100CC (DRAIN) ×5 IMPLANT
GAUZE SPONGE 2X2 8PLY STRL LF (GAUZE/BANDAGES/DRESSINGS) ×4 IMPLANT
GLOVE ECLIPSE 8.0 STRL XLNG CF (GLOVE) ×5 IMPLANT
GLOVE INDICATOR 8.0 STRL GRN (GLOVE) ×5 IMPLANT
GOWN STRL REUS W/TWL XL LVL3 (GOWN DISPOSABLE) ×10 IMPLANT
IRRIG SUCT STRYKERFLOW 2 WTIP (MISCELLANEOUS) ×5
IRRIGATION SUCT STRKRFLW 2 WTP (MISCELLANEOUS) ×4 IMPLANT
KIT BASIN OR (CUSTOM PROCEDURE TRAY) ×5 IMPLANT
KIT CLEAN ENDO COMPLIANCE (KITS) ×5 IMPLANT
KIT DEFENDO BUTTON (KITS) ×5 IMPLANT
LEGGING LITHOTOMY PAIR STRL (DRAPES) ×5 IMPLANT
MARKER SKIN DUAL TIP RULER LAB (MISCELLANEOUS) ×5 IMPLANT
PAD POSITIONING PINK XL (MISCELLANEOUS) ×5 IMPLANT
POSITIONER SURGICAL ARM (MISCELLANEOUS) IMPLANT
SCISSORS METZENBAUM CVD 33 (INSTRUMENTS) ×5 IMPLANT
SHEARS HARMONIC ACE PLUS 36CM (ENDOMECHANICALS) ×5 IMPLANT
SLEEVE XCEL OPT CAN 5 100 (ENDOMECHANICALS) ×10 IMPLANT
SPONGE DRAIN TRACH 4X4 STRL 2S (GAUZE/BANDAGES/DRESSINGS) IMPLANT
SPONGE GAUZE 2X2 STER 10/PKG (GAUZE/BANDAGES/DRESSINGS) ×1
STAPLER VISISTAT 35W (STAPLE) IMPLANT
SUT ETHIBOND 0 (SUTURE) IMPLANT
SUT ETHIBOND 0 36 GRN (SUTURE) IMPLANT
SUT ETHIBOND NAB CT1 #1 30IN (SUTURE) ×10 IMPLANT
SUT MNCRL AB 4-0 PS2 18 (SUTURE) ×5 IMPLANT
SUT PDS AB 2-0 CT2 27 (SUTURE) ×20 IMPLANT
SUT PROLENE 0 CT 2 (SUTURE) ×10 IMPLANT
SUT PROLENE 2 0 CT2 30 (SUTURE) ×10 IMPLANT
SUT PROLENE 2 0 SH DA (SUTURE) IMPLANT
TAPE CLOTH 4X10 WHT NS (GAUZE/BANDAGES/DRESSINGS) IMPLANT
TIP INNERVISION DETACH 40FR (MISCELLANEOUS) IMPLANT
TIP INNERVISION DETACH 50FR (MISCELLANEOUS) IMPLANT
TIP INNERVISION DETACH 56FR (MISCELLANEOUS) IMPLANT
TIPS INNERVISION DETACH 40FR (MISCELLANEOUS)
TOWEL OR 17X26 10 PK STRL BLUE (TOWEL DISPOSABLE) ×5 IMPLANT
TOWEL OR NON WOVEN STRL DISP B (DISPOSABLE) ×5 IMPLANT
TRAY FOLEY CATH 14FRSI W/METER (CATHETERS) ×5 IMPLANT
TRAY LAPAROSCOPIC (CUSTOM PROCEDURE TRAY) ×5 IMPLANT
TROCAR ADV FIXATION 5X100MM (TROCAR) ×5 IMPLANT
TROCAR BLADELESS OPT 5 100 (ENDOMECHANICALS) ×5 IMPLANT
TROCAR XCEL NON-BLD 11X100MML (ENDOMECHANICALS) ×5 IMPLANT
TUBING INSUF HEATED (TUBING) ×5 IMPLANT

## 2017-04-13 NOTE — Anesthesia Procedure Notes (Signed)
Procedure Name: Intubation Performed by: Gean Maidens, CRNA Pre-anesthesia Checklist: Patient identified, Emergency Drugs available, Suction available, Patient being monitored and Timeout performed Patient Re-evaluated:Patient Re-evaluated prior to induction Oxygen Delivery Method: Circle system utilized Preoxygenation: Pre-oxygenation with 100% oxygen Induction Type: IV induction Ventilation: Mask ventilation without difficulty Laryngoscope Size: Mac and 3 Grade View: Grade I Tube type: Oral Tube size: 7.0 mm Number of attempts: 1 Airway Equipment and Method: Stylet Placement Confirmation: ETT inserted through vocal cords under direct vision,  positive ETCO2,  CO2 detector and breath sounds checked- equal and bilateral Secured at: 21 cm Tube secured with: Tape Dental Injury: Teeth and Oropharynx as per pre-operative assessment

## 2017-04-13 NOTE — Transfer of Care (Signed)
Immediate Anesthesia Transfer of Care Note  Patient: Sydney [name]  Procedure(s) Performed: LAPAROSCOPIC NISSEN FUNDOPLICATION RE-DO WITH LYSIS OF ADHESIONS (N/A )  Patient Location: PACU  Anesthesia Type:General  Level of Consciousness: awake, alert  and oriented  Airway & Oxygen Therapy: Patient Spontanous Breathing and Patient connected to face mask oxygen  Post-op Assessment: Report given to RN and Post -op Vital signs reviewed and stable  Post vital signs: Reviewed and stable  Last Vitals:  Vitals:   04/12/17 2151 04/13/17 0443  BP: (!) 128/59 (!) 121/56  Pulse: 70 60  Resp: 15 16  Temp: 36.8 C 36.9 C  SpO2: 95% 96%    Last Pain:  Vitals:   04/13/17 0800  TempSrc:   PainSc: 0-No pain         Complications: No apparent anesthesia complications

## 2017-04-13 NOTE — Op Note (Addendum)
04/13/2017  6:01 PM  PATIENT:  Sydney [name]  72 y.o. 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 Erosion of suture and pledgets into the esophagogastric junction. 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    PROCEDURE:    Diagnostic laparoscopy with lysis of adhesions. Trans-gastric intraluminal laparoscopic exploration. Upper endoscopy. Endoscopic excision of suture and pledget foreign body from esophagogastric junction. Balloon dilation of esophageal distal esophageal stricture. Steroid injection of stricture with Kenalog-40 Endoscopic stenting across esophageal stricture. Laparoscopically assisted gastrostomy tube placement  SURGEON:  [name], MD  ASSIST:  RN  ANESTHESIA:   local and general  EBL:  Total I/O In: 2750 [I.V.:2750] Out: 1410 [Urine:1400; Blood:10]  Delay start of Pharmacological VTE agent (>24hrs) due to surgical blood loss or risk of bleeding:  no  ANESTHESIA: 1. General anesthesia. 2. Local anesthetic in a field block around all port sites.  SPECIMEN:  Mediastinal hernia sac (not sent).  DRAINS:  A 19-French Blake drain goes from the left lower quadrant up along the lesser curvature of the stomach and wraps around of the fundoplication.  24Fr MicKey gastrostomy tube in LUQ into antrum  COUNTS:  YES  PLAN OF CARE: Admit to inpatient   PATIENT DISPOSITION:  PACU - hemodynamically stable.  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  Underwent robotically assisted takedown and repair of paraesophageal hiatal hernia with pledgets and absorbable Phasix mesh reinforcement.  Fundoplication over 56 French bougie.  Patient struggled with some intermittent dysphasia but then became totally obstructed for 72 hours.  He became admitted.  Esophagram concerning for significant stricture in distal esophagus with side channeling suspicious for contained leak.  Attempt made for endoscopic stenting by gastroenterology but concerns with stricture and exposed suture and pledget that risk of perforation be high.  I had a long discussion with the patient and her husband.  I recommended endoscopic and operative exploration.  Feeding gastrostomy tube placement.  Excision of foreign body material.  Dilation and stenting of stricture.  Possible takedown and reoperation of the fundoplication itself.  Risks of stroke heart attack dying bleeding infection perforation esophagectomy reoperations and prolonged hospital stay discussed.  Questions answered and they agreed to proceed.   well.  Questions were answered.  The patient expresses understanding & wishes to proceed with surgery.  OR FINDINGS:   Dense adhesions in upper stomach especially to diaphragm consistent with prior fundoplication with hiatal closure and mesh reinforcement.  No evidence of any peritonitis nor leak.  Pinhole narrowing in the distal esophagus at the level of the fundoplication consistent with stricture.  Erosion of Ethibond suture as well as Ethibond and pledget at this level.  These were removed.  No evidence of any esophageal nor gastric necrosis.  No ischemia..  Right posterior side channel leaking into stomach going around the right posterior aspect of the wrap.  No evidence of extravasation in the mediastinum nor peritoneum.  Balloon dilatation done across stricture.  Covered stent placed across stricture.  24 French MIC key gastrostomy tube placed along mid  antrum along the greater curvature  19  [place] Blake drain wraps around the lesser curvature of the stomach and fundoplication  DESCRIPTION:   Informed consent was confirmed.  The patient received IV antibiotics prior to incision.  The underwent general anesthesia without difficulty.  A Foley catheter sterilely placed.  The patient was positioned in split leg with arms tucked. The abdomen was prepped and draped in the sterile fashion.  Surgical time-out confirmed our plan.  I placed a 5 mm port in the left upper quadrant paramedian region using optical entry technique with the patient in steep reverse Trendelenburg and left side up.  Entry was clean.  We induced carbon dioxide insufflation.  Camera inspection revealed no injury.  Extra 5 mm ports were carefully placed.  Inspection revealed evidence of fundoplication with the fundus of the stomach densely adherent to the diaphragm.  I did some lysis of adhesions to free the left lateral sector of the liver off the wrap.  However the tissues were quite inflamed and friable.  I could not see any exposure of the actual fundoplication itself.  I felt the risks of injury to stomach and esophagus were quite high.  In absence of any frank leak into the peritoneal cavity, I held off on any more aggressive dissection.  I proceeded to set up for gastrostomy tube placement.  I placed 2-0 PDS sutures interrupted x5 and pentagonal positioning along the lesser correct that greater curvature of the stomach at the level of the mid antrum.  I then used a suture passer to pull those tails up to have it flush with the peritoneum in the left upper quadrant.  I upsize the port to a 10 mm balloon port.  I then placed that port through the stomach and the center of those pentagonal interrupted sutures.  Blew up the balloon port.  I then clamped off the jejunum at the ligament of Treitz.  I then did intragastric inspection with the laparoscopic camera with carbon dioxide  insufflation.  The stomach and magenstrasse looked viable.  I could see a intact viable wrap.  There was obvious inflammation.  I cannot see any evidence of any ulceration, ischemia, nor necrosis.  I passed my scope up the wrap from the stomach side and could see an exposed Ethibond suture.  However it was somewhat angulated and I could not get the scope to cleanly pass up into the esophagus, so I held off.  With no evidence of an obvious intraperitoneal perforation or gangrene nor could necrosis, I decided to proceed with upper endoscopy.  Transorally past day and endoscope down the esophagus.  Encountered some thin milky fluid.  Encountered an obvious stricture.  About 4 mm pinhole.  There was an obvious side pouch inside opening as well consistent with the probable esophagogastric fistulization into the right posterior wrap as suspected by fluoroscopic esophagram as well as endoscopy yesterday..  I could see exposed pledget and Ethibond suture in the lumen at this level as well.  I went ahead and removed the pledget and 2 sutures endoscopically to good result.  I decided to go ahead and proceed with balloon dilatation across the esophageal stricture.  Started with a CRE fixed wire esophageal balloon dilatation catheter.  Started with a 6-7-8 mm balloon.  Passed the catheter across the stricture.  Could see the tip coming into the stomach through the intragastric laparoscopic camera.  Then we blew up to 6 mm for 30 seconds, 7 mm for 30 seconds, and 8 mm for 60 seconds.  We then  exchanged for a 10-19-08 mm balloon dilator.  Dilated an 8 mm for 30 seconds, 9 mm for 30 seconds, and 10 mm for 60 seconds.  Saw no obvious perforation or major cracking nor bleeding.  Transitioned to a 12-13.5-15 mm balloon dilator.  Dilated at 12 mm for 30 seconds, 13.5 for 30 seconds, 15 mm for 2 minutes.  That did to open up the stricture.  I then injected at the stricture with Kenalog 40 diluted to 6 mL. doing 66mL x 6 hexagonal  injections to good result.  We then passed a WallFlex Esophageal Fully covered stent 125 mm long by 23 mm wide.  Deployed that so two thirds of the stent was above the stricture.  Again did not need fluoroscopy since I could see the stent deploying from the stomach side with the laparoscopic camera that was intragastrically placed.  I then did EGD to confirm that the stent had deployed well in the esophageal side and had a good seal in the mid esophagus to hopefully seal off the side esophagogastric fistula.  I scrubbed back in laparoscopically and could pass the laparoscopic camera across the stricture up into the esophagus.  Probable resulting lumen around 10-12 mm.  Still tight but fluid easily passed across it.  Again no evidence of perforation nor ischemia.  There is no gas bubbling around the right posterior fistula on the wrap from the gastric side anymore.  I switched out the balloon port and placed a 24 MIC key gastrostomy tube stented with a narrow laparoscopic needle driver easily into the stomach.  Blew up the balloon and pulled it up.  I tied the 2-0 PDS transgastric and transfascial sutures down such that the stomach was secured to the abdominal wall and fascia with 5 interrupted sutures in a pentagonal fashion to good result.  Although there is no evidence of any intraperitoneal perforation, I decided to place a drain around the fundoplication as well just in case since she had delayed leaks and other problems.  Secured at skin with 2-0 Prolene.  Inspection so no evidence of any intraperitoneal perforation.  She had no hypoxia or hemodynamic compromise.  Overall reassuring.  Evacuated carbon dioxide.  Secured the gastrostomy tube with 0 Prolene interrupted sutures at the skin, around the tube, and out the flange.  Did that x3.  Sterile dressings applied.  Patient extubated in the recovery room.  Given the prolonged case and dilations, we will watch her in the stepdown unit for close observation.   If does well then can transfer to floor tomorrow.  Will consider esophagram next week.  If no evidence of leak then allow her to have liquids.  In the meantime have gastrostomy tube to gravity tonight and start tube feeds in the morning.  Focus on getting full enteral nutrition.  I discussed operative findings, updated the patient's status, discussed probable steps to recovery, and gave postoperative recommendations to the patient and family.  15 people.  Recommendations were made.  Questions were answered.  They expressed understanding & appreciation.    [name], M.D., F.A.C.S. Gastrointestinal and Minimally Invasive Surgery [facility] [address], [address] [place], [place] [address] [phone] Main / Paging

## 2017-04-13 NOTE — OR Nursing (Signed)
Dr. Michaell Cowing preformed an upper endoscopy;removed a pledget then   dilated with a 6,7,8, 9,10, 12, 13.5 and 15 mm balloons. He placed a Wallflex Esophageal stent 23 mm x 125 mm. All under direct visualization then removed the scope.

## 2017-04-13 NOTE — Anesthesia Preprocedure Evaluation (Signed)
Anesthesia Evaluation  Patient identified by MRN, date of birth, ID band Patient awake    Reviewed: Allergy & Precautions, NPO status , Patient's Chart, lab work & pertinent test results  Airway Mallampati: II  TM Distance: >3 FB Neck ROM: Full    Dental no notable dental hx.    Pulmonary neg pulmonary ROS,    Pulmonary exam normal breath sounds clear to auscultation       Cardiovascular hypertension, Pt. on medications Normal cardiovascular exam Rhythm:Regular Rate:Normal  EKG 09-27-16 on chart from [facility] care. SR, rate 67   Neuro/Psych negative neurological ROS  negative psych ROS   GI/Hepatic Neg liver ROS, hiatal hernia, GERD  Medicated,PARAESOPHAGEAL HIATAL HERNIA WITH ORGANOAXIAL VOLVULUS   Endo/Other  negative endocrine ROS  Renal/GU negative Renal ROS     Musculoskeletal negative musculoskeletal ROS (+)   Abdominal (+) + obese,   Peds  Hematology negative hematology ROS (+)   Anesthesia Other Findings   Reproductive/Obstetrics                             Anesthesia Physical  Anesthesia Plan  ASA: II  Anesthesia Plan: General   Post-op Pain Management:    Induction: Intravenous  PONV Risk Score and Plan: 2 and Ondansetron, Dexamethasone and Treatment may vary due to age or medical condition  Airway Management Planned: Oral ETT  Additional Equipment:   Intra-op Plan:   Post-operative Plan: Extubation in OR  Informed Consent: I have reviewed the patients History and Physical, chart, labs and discussed the procedure including the risks, benefits and alternatives for the proposed anesthesia with the patient or authorized representative who has indicated his/her understanding and acceptance.   Dental advisory given  Plan Discussed with: CRNA  Anesthesia Plan Comments:         Anesthesia Quick Evaluation

## 2017-04-13 NOTE — Progress Notes (Signed)
[address] [place], [place] [address] Phone: [phone]  FAX: [place] [phone] 1946-02-04  CARE TEAM:  PCP: Rory Percy, MD  Outpatient Care Team: Patient Care Team: Rory Percy, MD as PCP - General (Family Medicine) Michael Boston, MD as Consulting Physician (General Surgery) [place], Kirke Corin, MD as Consulting Physician (Gastroenterology)  Inpatient Treatment Team: Treatment Team: Attending Provider: Michael Boston, MD; Technician: Mamie Nick, [place]; Attending Physician: Milus Banister, MD; Registered Nurse: Heloise Ochoa, RN   Problem List:   Principal Problem:   Delayed esophageal leak Active Problems:   Paraesophageal hiatal hernia s/p repair 01/31/2017   Gastroesophageal reflux disease   Hypertension   Failure to thrive (0-17)   Esophageal obstruction - partial   Hypokalemia   Nausea & vomiting   Malnutrition of moderate degree   1 Day Post-Op  * No surgery found *     Assessment  Failure to thrive with esophageal obstruction most likely due to ischemic stricture versus distal food impaction.  Feeling  better.  Plan:  Esophagram and CT of chest and abdomen is suspicious for delayed esophageal leak at the level of the fundoplication which is intra-abdominal.  It is contained and not causing her sepsis or mediastinitis or peritonitis.  However she does have significant dysphagia from esophageal obstructive and leaking symptoms.   Discussed the case with [facility] gastroenterology.  Dr. Oretha Caprice.  He tried endoscopy but did not feel comfortable trying to dilate and stent given the obvious esophagogastric fistula, exposed suture in the esophagogastric region and a very tight stricture.  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  Hence contrast going around towards the right.  Reviewed films & EGD and discussed with my partners (Drs Bernerd Pho, etc).  I think she requires surgical intervention.  At the very least, would place a feeding gastrostomy tube to give her tube feeds.  Needs to be transabdominal since no PEG endoscopic option with her stricture and tear consider repeat endoscopy with dilation and stenting intraoperatively with diagnostic laparoscopy.  See if the foreign body pledget and suturing that is intraluminal can be excised and removed endoscopically.  Treat it like a gastric band erosion.  Often removing foreign body allows the area to heal and recover more easily.  Do this in the operating room with laparoscopy to make sure that there is no obvious perforation or leak with this intervention.  Perhaps trying OMENTOPEXY around the region.  I may consider taking part of the wrap down or trying to redo this but risk of worsening esophageal tear and need for esophagectomy much higher.  Would not like to do that big of a surgery with her malnourished and inflamed state right now.  Still, it may come to that if I cannot palliate the obstruction and her malnutrition better.  I long discussion with the patient and her husband.  She is worried about proceeding with surgery but agrees she cannot stay like this.  She understands her markedly increased risks including stroke heart attack and death leak fistula was numerous operations, etc.  IV Decadron times 72 hours.    IV antibiotics to help minimize infection progression  Hypokalemia.  Corrected.  Follow with magnesium.  -HTN control -GERD control -VTE prophylaxis- SCDs, etc -mobilize as tolerated to help recovery  75 minutes spent in review, evaluation, examination, counseling, and coordination of care.  More than 50% of that time was spent  in counseling.  [name], M.D., F.A.C.S. Gastrointestinal and Minimally Invasive Surgery [facility] [address] [place], [place] [address] [phone] Main / Paging   04/13/2017    Subjective: (Chief complaint)  Sore throat from EGD.  No shortness of breath.  No abdominal pain.  Gastroenterology not able to safely go across tight esophageal stricture with exposed suture/pledget as well as probable right posterior esophageal leak.  Did not worsen on the table so it is contained.  Objective:  Vital signs:  Vitals:   04/12/17 1420 04/12/17 1448 04/12/17 2151 04/13/17 0443  BP: (!) 126/47 (!) 143/77 (!) 128/59 (!) 121/56  Pulse: 70 66 70 60  Resp: 12 14 15 16   Temp:  97.8 F (36.6 C) 98.3 F (36.8 C) 98.4 F (36.9 C)  TempSrc:  Oral Oral Oral  SpO2: 96% 96% 95% 96%  Weight:      Height:        Last BM Date: 04/11/17  Intake/Output   Yesterday:  01/31 0701 - 02/01 0700 In: 3400 [I.V.:3200; IV Piggyback:200] Out: 1900 [Urine:1900] This shift:  Total I/O In: 2700 [I.V.:2500; IV Piggyback:200] Out: 1000 [Urine:1000]  Bowel function:  Flatus: YES  BM:  No  Drain: (No drain)   Physical Exam:  General: Pt awake/alert/oriented x4 in no acute distress.  Sad and mildly anxious but consolable.  Not sickly.  Not toxic.   Eyes: PERRL, normal EOM.  Sclera clear.  No icterus Neuro: CN II-XII intact w/o focal sensory/motor deficits. Lymph: No head/neck/groin lymphadenopathy Psych:  No delerium/psychosis/paranoia HENT: Normocephalic, Mucus membranes moist.  No thrush Neck: Supple, No tracheal deviation Chest: No chest wall pain w good excursion.  No wheezing.  Lungs clear .  no conversational dyspnea. CV:  Pulses intact.  Regular rhythm MS: Normal AROM mjr joints.  No obvious deformity  Abdomen: Soft.  Nondistended.  Nontender.  No evidence of peritonitis.  No incarcerated hernias.  Ext:  No deformity.  No mjr edema.   No cyanosis Skin: No petechiae / purpura  Results:   Labs: Results for orders placed or performed during the hospital encounter of 04/10/17 (from the past 48 hour(s))  Basic metabolic panel     Status: Abnormal   Collection Time: 04/12/17  6:38 AM  Result Value Ref Range   Sodium 138 135 - 145 mmol/L   Potassium 3.0 (L) 3.5 - 5.1 mmol/L   Chloride 99 (L) 101 - 111 mmol/L   CO2 30 22 - 32 mmol/L   Glucose, Bld 129 (H) 65 - 99 mg/dL   BUN 5 (L) 6 - 20 mg/dL   Creatinine, Ser 0.44 0.44 - 1.00 mg/dL   Calcium 8.6 (L) 8.9 - 10.3 mg/dL   GFR calc non Af Amer >60 >60 mL/min   GFR calc Af Amer >60 >60 mL/min    Comment: (NOTE) The eGFR has been calculated using the CKD EPI equation. This calculation has not been validated in all clinical situations. eGFR's persistently <60 mL/min signify possible Chronic Kidney Disease.    Anion gap 9 5 - 15  Magnesium     Status: Abnormal   Collection Time: 04/12/17  6:38 AM  Result Value Ref Range   Magnesium 1.6 (  L) 1.7 - 2.4 mg/dL    Imaging / Studies: Ct Chest W Contrast  Result Date: 04/11/2017 CLINICAL DATA:  Status post hiatal hernia repair with anastomotic breakdown. EXAM: CT CHEST AND ABDOMEN WITH CONTRAST TECHNIQUE: Multidetector CT imaging of the chest and abdomen was performed following the standard protocol during bolus administration of intravenous contrast. CONTRAST:  76m ISOVUE-300 IOPAMIDOL (ISOVUE-300) INJECTION 61%, 1089mISOVUE-300 IOPAMIDOL (ISOVUE-300) INJECTION 61% COMPARISON:  Swallow study from earlier today. FINDINGS: CT CHEST FINDINGS Cardiovascular: Normal heart size. No pericardial effusion. Mild aortic atherosclerosis. Mediastinum/Nodes: The trachea appears patent and is midline. Dilated and contrast esophagus containing identified. Postoperative changes from hiatal hernia repair are identified at the level of the GE junction. There is surrounding fluid, inflammation and extraluminal enteric contrast material and gas  identified. Although most of the enteric contrast material passes into the gastric fundus the extravasated contrast material from swallow study can be seen within the surrounding soft tissues just below the diaphragmatic hiatus, image 98 of series 6 and image 45 of series 2. Lungs/Pleura: A small left pleural effusion is identified. Subsegmental atelectasis noted within the medial right lower lobe. Musculoskeletal: No suspicious bone lesions. CT ABDOMEN FINDINGS Hepatobiliary: Mild hepatic steatosis. Focal area of low attenuation within the medial segment of left lobe of liver adjacent to the falciform ligament measures 4 cm, image 17 of series 7. Previous cholecystectomy. No biliary dilatation. Pancreas: Unremarkable. No pancreatic ductal dilatation or surrounding inflammatory changes. Spleen: Normal in size without focal abnormality. Adrenals/Urinary Tract: The adrenal glands appear normal. Unremarkable appearance of both kidneys. Stomach/Bowel: Postsurgical changes from previous hiatal hernia repair noted. Just below the diaphragmatic hiatus there is evidence of posterior extravasation of contrast material and extraluminal gas with surrounding inflammatory changes. The visualized upper abdominal bowel loops are nondilated. Vascular/Lymphatic: Aortic atherosclerosis. No aneurysm. No upper abdominal adenopathy. Other: None Musculoskeletal: Scoliosis and degenerative disc disease within the lumbar spine. IMPRESSION: 1. Examination again demonstrates extravasation of contrast material just below the level of the diaphragmatic hiatus from the Nissen fundoplication site compatible with breakdown of hiatal hernia repair. On the sagittal images the extravasation appears to extend posteriorly. On the coronal images there is lateral and medial extravasation of contrast. Emergent results were called by telephone at the time of interpretation on 04/11/2017 at 5:40 pm to Dr. STMichael Boston who verbally acknowledged these  results. 2. Hepatic steatosis. Focal low-attenuation area within the medial left lobe of liver measures 4 mm. Indeterminate but favored to represent an area of focal fatty deposition. Suggest followup imaging with liver protocol MRI to confirm. 3. Small left effusion. 4.  Aortic Atherosclerosis (ICD10-I70.0). Electronically Signed   By: TaKerby Moors.D.   On: 04/11/2017 17:42   Ct Abdomen W Contrast  Result Date: 04/11/2017 CLINICAL DATA:  Status post hiatal hernia repair with anastomotic breakdown. EXAM: CT CHEST AND ABDOMEN WITH CONTRAST TECHNIQUE: Multidetector CT imaging of the chest and abdomen was performed following the standard protocol during bolus administration of intravenous contrast. CONTRAST:  1055mSOVUE-300 IOPAMIDOL (ISOVUE-300) INJECTION 61%, 100m38mOVUE-300 IOPAMIDOL (ISOVUE-300) INJECTION 61% COMPARISON:  Swallow study from earlier today. FINDINGS: CT CHEST FINDINGS Cardiovascular: Normal heart size. No pericardial effusion. Mild aortic atherosclerosis. Mediastinum/Nodes: The trachea appears patent and is midline. Dilated and contrast esophagus containing identified. Postoperative changes from hiatal hernia repair are identified at the level of the GE junction. There is surrounding fluid, inflammation and extraluminal enteric contrast material and gas identified. Although most of the enteric contrast material passes into  the gastric fundus the extravasated contrast material from swallow study can be seen within the surrounding soft tissues just below the diaphragmatic hiatus, image 98 of series 6 and image 45 of series 2. Lungs/Pleura: A small left pleural effusion is identified. Subsegmental atelectasis noted within the medial right lower lobe. Musculoskeletal: No suspicious bone lesions. CT ABDOMEN FINDINGS Hepatobiliary: Mild hepatic steatosis. Focal area of low attenuation within the medial segment of left lobe of liver adjacent to the falciform ligament measures 4 cm, image 17 of  series 7. Previous cholecystectomy. No biliary dilatation. Pancreas: Unremarkable. No pancreatic ductal dilatation or surrounding inflammatory changes. Spleen: Normal in size without focal abnormality. Adrenals/Urinary Tract: The adrenal glands appear normal. Unremarkable appearance of both kidneys. Stomach/Bowel: Postsurgical changes from previous hiatal hernia repair noted. Just below the diaphragmatic hiatus there is evidence of posterior extravasation of contrast material and extraluminal gas with surrounding inflammatory changes. The visualized upper abdominal bowel loops are nondilated. Vascular/Lymphatic: Aortic atherosclerosis. No aneurysm. No upper abdominal adenopathy. Other: None Musculoskeletal: Scoliosis and degenerative disc disease within the lumbar spine. IMPRESSION: 1. Examination again demonstrates extravasation of contrast material just below the level of the diaphragmatic hiatus from the Nissen fundoplication site compatible with breakdown of hiatal hernia repair. On the sagittal images the extravasation appears to extend posteriorly. On the coronal images there is lateral and medial extravasation of contrast. Emergent results were called by telephone at the time of interpretation on 04/11/2017 at 5:40 pm to Dr. Michael Boston , who verbally acknowledged these results. 2. Hepatic steatosis. Focal low-attenuation area within the medial left lobe of liver measures 4 mm. Indeterminate but favored to represent an area of focal fatty deposition. Suggest followup imaging with liver protocol MRI to confirm. 3. Small left effusion. 4.  Aortic Atherosclerosis (ICD10-I70.0). Electronically Signed   By: Kerby Moors M.D.   On: 04/11/2017 17:42   Dg Esophagus W/water Sol Cm  Result Date: 04/11/2017 CLINICAL DATA:  Dysphagia.  Recent hiatal hernia repair. EXAM: ESOPHOGRAM TECHNIQUE: Single contrast examination was performed using water soluble contrast. FLUOROSCOPY TIME:  Fluoroscopy Time:  2.9 minutes  Radiation Exposure Index (if provided by the fluoroscopic device): 47.70 mGy Number of Acquired Spot Images: 0 COMPARISON:  Esophagrams dated 02/01/2017 and 02/17/2017 FINDINGS: The patient took small sips of water soluble contrast in upright position. There is a visible leak from the distal esophagus at the level of the diaphragm extending to the right of midline. There is also marked narrowing of esophagus just below this leak but some contrast does pass through the area of narrowing into the distal esophagus and into the stomach. There appears to be a communication of the distal esophageal leak back into the fundus of the stomach to the right of the esophagus. IMPRESSION: 1. Extravasation of contrast from the distal esophagus to a the right of midline and extending around the Nissen fundoplication. Contrast also appears to have a second communication from the area of extravasation back into the fundus of the stomach. 2. There are 2 areas of narrowing of the esophagus, one just distal to the extravasation and one at the gastroesophageal junction. Electronically Signed   By: Lorriane Shire M.D.   On: 04/11/2017 09:23    Medications / Allergies: per chart  Antibiotics: Anti-infectives (From admission, onward)   Start     Dose/Rate Route Frequency Ordered Stop   04/11/17 1800  piperacillin-tazobactam (ZOSYN) IVPB 3.375 g     3.375 g 12.5 mL/hr over 240 Minutes Intravenous Every 8 hours  04/11/17 1750          Note: Portions of this report may have been transcribed using voice recognition software. Every effort was made to ensure accuracy; however, inadvertent computerized transcription errors may be present.   Any transcriptional errors that result from this process are unintentional.     [name], M.D., F.A.C.S. Gastrointestinal and Minimally Invasive Surgery [facility] [address] [place], [place] [address] [phone] Main /  Paging   04/13/2017

## 2017-04-13 NOTE — Care Management Important Message (Signed)
Important Message  Patient Details  Name: Sydney [name] MRN: [id number] Date of Birth: 1945/07/27   Medicare Important Message Given:  Yes    Caren Macadam 04/13/2017, 11:05 AMImportant Message  Patient Details  Name: Sydney [name] MRN: [id number] Date of Birth: 01-23-1946   Medicare Important Message Given:  Yes    Caren Macadam 04/13/2017, 11:05 AM

## 2017-04-13 NOTE — Anesthesia Postprocedure Evaluation (Signed)
Anesthesia Post Note  Patient: Sydney [name]  Procedure(s) Performed: Laproscopic lysis of Adhesions (N/A Abdomen) ESOPHAGOGASTRODUODENOSCOPY (EGD) WITH BALLOON DILATION (Esophagus) LAPAROSCOPIC GASTRIC  EXPLORATION (Abdomen) GASTROSTOMY TUBE PLACEMENT (N/A Abdomen) ESOPHAGEAL STENT PLACEMENT (Esophagus) EXCISION OF FOREIGN BODY X ESOPHAGUS  (N/A Esophagus) STEROID INJECTION ESOPHAGUS  (Esophagus)     Patient location during evaluation: PACU Anesthesia Type: General Level of consciousness: sedated and patient cooperative Pain management: pain level controlled Vital Signs Assessment: post-procedure vital signs reviewed and stable Respiratory status: spontaneous breathing Cardiovascular status: stable Anesthetic complications: no    Last Vitals:  Vitals:   04/13/17 2045 04/13/17 2100  BP: (!) 144/63 138/70  Pulse: 80 78  Resp: 13 17  Temp:    SpO2: 100% 100%    Last Pain:  Vitals:   04/13/17 2032  TempSrc:   PainSc: 0-No pain                 Lewie Loron

## 2017-04-13 NOTE — Progress Notes (Signed)
Sydney Sydney [name] Progress Note    Since last GI note: Esophagoscopy yesterday, full report in chart  Objective: Vital signs in last 24 hours: Temp:  [97.6 F (36.4 C)-98.4 F (36.9 C)] 98.4 F (36.9 C) (02/01 0443) Pulse Rate:  [60-87] 60 (02/01 0443) Resp:  [12-19] 16 (02/01 0443) BP: (121-155)/(45-Sydney) 121/56 (02/01 0443) SpO2:  [95 %-100 %] 96 % (02/01 0443) Weight:  [132 lb (59.9 kg)] 132 lb (59.9 kg) (01/31 1235) Last BM Date: 04/11/17 General: alert and oriented times 3 Heart: regular rate and rythm Abdomen: soft, non-tender, non-distended, normal bowel sounds   Lab Results: Recent Labs    04/10/17 1846  WBC 10.5  HGB 14.1  PLT 418*  MCV 79.2   Recent Labs    04/10/17 1846 04/12/17 0638  NA 139 138  K 2.7* 3.0*  CL 93* 99*  CO2 31 30  GLUCOSE 127* 129*  BUN 13 5*  CREATININE 0.60 0.44  CALCIUM 9.5 8.6*   Recent Labs    04/10/17 1846  PROT 7.1  ALBUMIN 3.3*  AST 21  ALT 14  ALKPHOS 82  BILITOT 1.5*    Medications: Scheduled Meds: . chlorhexidine  15 mL Mouth Rinse BID  . dexamethasone  4 mg Intravenous Q12H  . dexamethasone  4 mg Intravenous On Call to OR  . feeding supplement  1 Container Oral BID BM  . lip balm  1 application Topical BID  . mouth rinse  15 mL Mouth Rinse q12n4p  . ondansetron  4 mg Intravenous Q6H  . potassium chloride  60 mEq Oral Once  . scopolamine  1 patch Transdermal On Call to OR  . simethicone  40 mg Oral QID   Continuous Infusions: . cefTRIAXone (ROCEPHIN)  IV     And  . metronidazole    . chlorproMAZINE (THORAZINE) IV    . famotidine (PEPCID) IV Stopped (04/12/17 2225)  . lactated ringers 125 mL/hr at 04/13/17 0037  . methocarbamol (ROBAXIN)  IV    . piperacillin-tazobactam (ZOSYN)  IV 3.375 g (04/13/17 0206)   PRN Meds:.acetaminophen **OR** acetaminophen, alum & mag hydroxide-simeth, bisacodyl, chlorproMAZINE (THORAZINE) IV, diphenhydrAMINE **OR** diphenhydrAMINE, fentaNYL (SUBLIMAZE) injection,  guaiFENesin-dextromethorphan, hydrALAZINE, hydrocortisone, hydrocortisone cream, hydrocortisone-pramoxine, magic mouthwash, menthol-cetylpyridinium, methocarbamol (ROBAXIN)  IV, metoCLOPramide (REGLAN) injection, metoprolol tartrate, ondansetron **OR** ondansetron (ZOFRAN) IV, phenol, prochlorperazine, simethicone    Assessment/Plan: 72 y.o. Sydney [name] with complicated recovery following HH repair 2 months ago  I communicated with Dr. Michaell Cowing, other colleagues and we all agree with plans for reoperation, surgical repair of the extravasation site, esophagus.  I had a nice discussion with her and her husband this morning about the plan.   Sydney Fee, MD  04/13/2017, 9:20 AM [place] Sydney [name] Pager [phone]

## 2017-04-14 ENCOUNTER — Other Ambulatory Visit: Payer: Self-pay

## 2017-04-14 LAB — BASIC METABOLIC PANEL
ANION GAP: 11 (ref 5–15)
BUN: 5 mg/dL — ABNORMAL LOW (ref 6–20)
CALCIUM: 8.5 mg/dL — AB (ref 8.9–10.3)
CO2: 31 mmol/L (ref 22–32)
Chloride: 95 mmol/L — ABNORMAL LOW (ref 101–111)
Creatinine, Ser: 0.49 mg/dL (ref 0.44–1.00)
GFR calc non Af Amer: >60 mL/min (ref >60)
Glucose, Bld: 100 mg/dL — ABNORMAL HIGH (ref 65–99)
Potassium: 3 mmol/L — ABNORMAL LOW (ref 3.5–5.1)
SODIUM: 137 mmol/L (ref 135–145)

## 2017-04-14 LAB — CBC
HCT: 33.5 % — ABNORMAL LOW (ref 36.0–46.0)
HEMOGLOBIN: 11.3 g/dL — AB (ref 12.0–15.0)
MCH: 26.5 pg (ref 26.0–34.0)
MCHC: 33.7 g/dL (ref 30.0–36.0)
MCV: 78.6 fL (ref 78.0–100.0)
PLATELETS: 288 10*3/uL (ref 150–400)
RBC: 4.26 MIL/uL (ref 3.87–5.11)
RDW: 15 % (ref 11.5–15.5)
WBC: 6.9 10*3/uL (ref 4.0–10.5)

## 2017-04-14 LAB — GLUCOSE, CAPILLARY
GLUCOSE-CAPILLARY: 79 mg/dL (ref 65–99)
Glucose-Capillary: 129 mg/dL — ABNORMAL HIGH (ref 65–99)
Glucose-Capillary: 82 mg/dL (ref 65–99)
Glucose-Capillary: 87 mg/dL (ref 65–99)

## 2017-04-14 MED ORDER — POTASSIUM CHLORIDE 20 MEQ/15ML (10%) PO SOLN
20.0000 meq | Freq: Two times a day (BID) | ORAL | Status: DC
  Administered 2017-04-14 – 2017-04-15 (×3): 20 meq via ORAL
  Filled 2017-04-14 (×3): qty 15

## 2017-04-14 MED ORDER — ACETAMINOPHEN 160 MG/5ML PO SOLN
500.0000 mg | Freq: Four times a day (QID) | ORAL | Status: DC | PRN
Start: 2017-04-14 — End: 2017-04-20
  Administered 2017-04-19: 650 mg
  Filled 2017-04-14: qty 20.3

## 2017-04-14 MED ORDER — OXYCODONE HCL 5 MG/5ML PO SOLN
5.0000 mg | ORAL | Status: DC | PRN
  Administered 2017-04-19 (×2): 5 mg
  Filled 2017-04-14 (×2): qty 5

## 2017-04-14 MED ORDER — PANTOPRAZOLE SODIUM 40 MG PO PACK
40.0000 mg | PACK | Freq: Every day | ORAL | Status: DC
  Administered 2017-04-14 – 2017-04-15 (×2): 40 mg
  Filled 2017-04-14 (×3): qty 20

## 2017-04-14 MED ORDER — MAGIC MOUTHWASH
15.0000 mL | Freq: Four times a day (QID) | ORAL | Status: DC | PRN
  Administered 2017-04-19: 15 mL via ORAL
  Filled 2017-04-14: qty 15

## 2017-04-14 MED ORDER — BACITRACIN-NEOMYCIN-POLYMYXIN 400-5-5000 EX OINT
1.0000 "application " | TOPICAL_OINTMENT | Freq: Every day | CUTANEOUS | Status: DC
  Administered 2017-04-14 – 2017-04-19 (×5): 1 via TOPICAL
  Filled 2017-04-14: qty 1

## 2017-04-14 MED ORDER — OMEPRAZOLE 2 MG/ML ORAL SUSPENSION
40.0000 mg | Freq: Every day | ORAL | Status: DC
  Filled 2017-04-14: qty 20

## 2017-04-14 MED ORDER — JEVITY 1.2 CAL PO LIQD
1000.0000 mL | ORAL | Status: DC
  Administered 2017-04-15: 1000 mL

## 2017-04-14 MED ORDER — JEVITY 1.2 CAL PO LIQD: 1000.0000 mL | ORAL | Status: DC

## 2017-04-14 MED ORDER — MAGIC MOUTHWASH
15.0000 mL | Freq: Four times a day (QID) | ORAL | Status: AC
  Administered 2017-04-14 (×3): 15 mL via ORAL
  Filled 2017-04-14 (×3): qty 15

## 2017-04-14 MED ORDER — METOCLOPRAMIDE HCL 5 MG/5ML PO SOLN
10.0000 mg | Freq: Four times a day (QID) | ORAL | Status: AC
  Administered 2017-04-14 – 2017-04-15 (×7): 10 mg
  Filled 2017-04-14 (×9): qty 10

## 2017-04-14 NOTE — Progress Notes (Signed)
1 Day Post-Op   Subjective/Chief Complaint: Complains of pain this AM.  Says pain is under her left rib.  Denies nausea, but on scheduled zofran.  No flatus, feels a little bloated.    Objective: Vital signs in last 24 hours: Temp:  [96 F (35.6 C)-98 F (36.7 C)] 98 F (36.7 C) (02/02 0400) Pulse Rate:  [68-87] 68 (02/02 0200) Resp:  [12-20] 14 (02/02 0200) BP: (116-156)/(42-79) 116/42 (02/02 0200) SpO2:  [97 %-100 %] 97 % (02/02 0200) Weight:  [62.5 kg (137 lb 12.6 oz)] 62.5 kg (137 lb 12.6 oz) (02/01 2000) Last BM Date: 04/12/17  Intake/Output from previous day: 02/01 0701 - 02/02 0700 In: 3850 [I.V.:3850] Out: 3230 [Urine:3200; Drains:20; Blood:10] Intake/Output this shift: Total I/O In: -  Out: 1600 [Urine:1600]  General appearance: sleeping, awakened easily, moderate distress, RN bringing pain meds already. Resp: breathing comfortably GI: soft, sl distended.  G tube kinked.  Unkinked and significant amount of fluid came out. Extremities: extremities normal, atraumatic, no cyanosis or edema  Lab Results:  No results for input(s): WBC, HGB, HCT, PLT in the last 72 hours. BMET Recent Labs    04/12/17 0638  NA 138  K 3.0*  CL 99*  CO2 30  GLUCOSE 129*  BUN 5*  CREATININE 0.44  CALCIUM 8.6*   PT/INR No results for input(s): LABPROT, INR in the last 72 hours. ABG No results for input(s): PHART, HCO3 in the last 72 hours.  Invalid input(s): PCO2, PO2  Studies/Results: No results found.  Anti-infectives: Anti-infectives (From admission, onward)   Start     Dose/Rate Route Frequency Ordered Stop   04/13/17 0900  cefTRIAXone (ROCEPHIN) 2 g in dextrose 5 % 50 mL IVPB     2 g 100 mL/hr over 30 Minutes Intravenous On call to O.R. 04/13/17 0856 04/13/17 1449   04/13/17 0900  metroNIDAZOLE (FLAGYL) IVPB 500 mg     500 mg 100 mL/hr over 60 Minutes Intravenous Every 8 hours 04/13/17 0856     04/11/17 1800  piperacillin-tazobactam (ZOSYN) IVPB 3.375 g     3.375  g 12.5 mL/hr over 240 Minutes Intravenous Every 8 hours 04/11/17 1750        Assessment/Plan: s/p Procedure(s): Laproscopic lysis of Adhesions (N/A) ESOPHAGOGASTRODUODENOSCOPY (EGD) WITH BALLOON DILATION LAPAROSCOPIC GASTRIC  EXPLORATION GASTROSTOMY TUBE PLACEMENT (N/A) ESOPHAGEAL STENT PLACEMENT EXCISION OF FOREIGN BODY X ESOPHAGUS  (N/A) STEROID INJECTION ESOPHAGUS  PAS  Continue foley due to urinary output monitoring Ambulate/OOB today. Recheck labs for hypokalemia and hypomagnesemia. Lovenox PPX today. Consider transfer to floor if labs OK.   Nutrition consult for tube feed recs.  Plan to start trickle feeds tomorrow.  LOS: 4 days    Almond Lint 04/14/2017

## 2017-04-14 NOTE — Progress Notes (Signed)
RN notified by telemetry patient experiencing an elevated ST.  RN performed a bedside EKG showing NSR at this time with no ST elevation.  Electrodes replaced on patients telemetry.  RN will continue to monitor.

## 2017-04-15 LAB — CBC
HCT: 35.7 % — ABNORMAL LOW (ref 36.0–46.0)
Hemoglobin: 11.9 g/dL — ABNORMAL LOW (ref 12.0–15.0)
MCH: 26.4 pg (ref 26.0–34.0)
MCHC: 33.3 g/dL (ref 30.0–36.0)
MCV: 79.3 fL (ref 78.0–100.0)
PLATELETS: 272 10*3/uL (ref 150–400)
RBC: 4.5 MIL/uL (ref 3.87–5.11)
RDW: 15 % (ref 11.5–15.5)
WBC: 7 10*3/uL (ref 4.0–10.5)

## 2017-04-15 LAB — GLUCOSE, CAPILLARY
GLUCOSE-CAPILLARY: 87 mg/dL (ref 65–99)
Glucose-Capillary: 78 mg/dL (ref 65–99)
Glucose-Capillary: 102 mg/dL — ABNORMAL HIGH (ref 65–99)
Glucose-Capillary: 96 mg/dL (ref 65–99)
Glucose-Capillary: 112 mg/dL — ABNORMAL HIGH (ref 65–99)
Glucose-Capillary: 118 mg/dL — ABNORMAL HIGH (ref 65–99)

## 2017-04-15 LAB — BASIC METABOLIC PANEL
Anion gap: 11 (ref 5–15)
CO2: 31 mmol/L (ref 22–32)
CREATININE: 0.44 mg/dL (ref 0.44–1.00)
Calcium: 8.6 mg/dL — ABNORMAL LOW (ref 8.9–10.3)
Chloride: 97 mmol/L — ABNORMAL LOW (ref 101–111)
GFR calc Af Amer: >60 mL/min (ref >60)
GFR calc non Af Amer: >60 mL/min (ref >60)
GLUCOSE: 83 mg/dL (ref 65–99)
POTASSIUM: 2.6 mmol/L — AB (ref 3.5–5.1)
Sodium: 139 mmol/L (ref 135–145)

## 2017-04-15 LAB — MAGNESIUM: MAGNESIUM: 1.4 mg/dL — AB (ref 1.7–2.4)

## 2017-04-15 MED ORDER — POTASSIUM CHLORIDE 10 MEQ/100ML IV SOLN
10.0000 meq | INTRAVENOUS | Status: AC
  Administered 2017-04-15 – 2017-04-16 (×6): 10 meq via INTRAVENOUS
  Filled 2017-04-15 (×6): qty 100

## 2017-04-15 MED ORDER — POTASSIUM CHLORIDE 20 MEQ/15ML (10%) PO SOLN
20.0000 meq | Freq: Three times a day (TID) | ORAL | Status: DC
  Administered 2017-04-15 (×2): 20 meq via ORAL
  Filled 2017-04-15 (×4): qty 15

## 2017-04-15 MED ORDER — FUROSEMIDE 10 MG/ML IJ SOLN
40.0000 mg | Freq: Once | INTRAMUSCULAR | Status: AC
  Administered 2017-04-15: 40 mg via INTRAVENOUS
  Filled 2017-04-15: qty 4

## 2017-04-15 MED ORDER — OSMOLITE 1.5 CAL PO LIQD
1000.0000 mL | ORAL | Status: DC
  Administered 2017-04-15: 1000 mL
  Filled 2017-04-15 (×3): qty 1000

## 2017-04-15 NOTE — Progress Notes (Signed)
2 Days Post-Op   Subjective/Chief Complaint: Feeling "a lot better compared to yesterday."  Pain improved.    Objective: Vital signs in last 24 hours: Temp:  [97.4 F (36.3 C)-98.7 F (37.1 C)] 97.7 F (36.5 C) (02/03 0700) Pulse Rate:  [65-78] 65 (02/03 0000) Resp:  [13-19] 13 (02/03 0000) BP: (131-155)/(57-73) 131/62 (02/03 0000) SpO2:  [90 %-100 %] 90 % (02/03 0000) Last BM Date: 04/12/17  Intake/Output from previous day: 02/02 0701 - 02/03 0700 In: 2032.9 [I.V.:1302.9; IV Piggyback:550] Out: 1530 [Urine:750; Drains:780] Intake/Output this shift: No intake/output data recorded.  General appearance: sleeping, awakened easily, moderate distress, RN bringing pain meds already. Resp: breathing comfortably GI: soft, minimally distended.  G tube output much less and more clear. Extremities: extremities normal, atraumatic, no cyanosis or edema  Lab Results:  Recent Labs    04/14/17 0846  WBC 6.9  HGB 11.3*  HCT 33.5*  PLT 288   BMET Recent Labs    04/14/17 0846  NA 137  K 3.0*  CL 95*  CO2 31  GLUCOSE 100*  BUN 5*  CREATININE 0.49  CALCIUM 8.5*   PT/INR No results for input(s): LABPROT, INR in the last 72 hours. ABG No results for input(s): PHART, HCO3 in the last 72 hours.  Invalid input(s): PCO2, PO2  Studies/Results: No results found.  Anti-infectives: Anti-infectives (From admission, onward)   Start     Dose/Rate Route Frequency Ordered Stop   04/13/17 0900  cefTRIAXone (ROCEPHIN) 2 g in dextrose 5 % 50 mL IVPB     2 g 100 mL/hr over 30 Minutes Intravenous On call to O.R. 04/13/17 0856 04/13/17 1449   04/13/17 0900  metroNIDAZOLE (FLAGYL) IVPB 500 mg     500 mg 100 mL/hr over 60 Minutes Intravenous Every 8 hours 04/13/17 0856     04/11/17 1800  piperacillin-tazobactam (ZOSYN) IVPB 3.375 g     3.375 g 12.5 mL/hr over 240 Minutes Intravenous Every 8 hours 04/11/17 1750 04/15/17 0959      Assessment/Plan: s/p Procedure(s): Laproscopic lysis  of Adhesions (N/A) ESOPHAGOGASTRODUODENOSCOPY (EGD) WITH BALLOON DILATION LAPAROSCOPIC GASTRIC  EXPLORATION GASTROSTOMY TUBE PLACEMENT (N/A) ESOPHAGEAL STENT PLACEMENT EXCISION OF FOREIGN BODY X ESOPHAGUS  (N/A) STEROID INJECTION ESOPHAGUS  PAS Foley out Transfer to floor.   Ambulate/OOB today. Recheck labs for hypokalemia and hypomagnesemia. Lovenox PPX today. Transfer to floor Trickle feeds today UGI tomorrow.    LOS: 5 days    Almond Lint 04/15/2017

## 2017-04-15 NOTE — Progress Notes (Signed)
Received patient into room 1534 via wheelchair. Lina Sar, RN

## 2017-04-15 NOTE — Progress Notes (Signed)
Nutrition Follow-up  DOCUMENTATION CODES:   Non-severe (moderate) malnutrition in context of acute illness/injury  INTERVENTION:   Monitor magnesium, potassium, and phosphorus daily for at least 3 days, MD to replete as needed, as pt is at risk for refeeding syndrome given malnutrition, poor intakes since November 2018 and low Mg/K levels.  Initiate Trickle feeds of Osmolite 1.5 @ 20 ml/hr via G-tube per surgery.  Goal rate will be Osmolite 1.5 @ 50 ml/hr -providing 1800 kcal and 75g protein.  NUTRITION DIAGNOSIS:   Moderate Malnutrition related to acute illness(nissen fundoplication complications) as evidenced by percent weight loss, energy intake < or equal to 50% for > or equal to 1 month, mild fat depletion, mild muscle depletion.  Ongoing.  GOAL:   Patient will meet greater than or equal to 90% of their needs  Progressing.  MONITOR:   PO intake, Supplement acceptance, Diet advancement, Weight trends, Labs  REASON FOR ASSESSMENT:   Consult Assessment of nutrition requirement/status, Enteral/tube feeding initiation and management  ASSESSMENT:   Pt with PMH significant for large hiatal hernia s/p robotic repair with Nissen fundoplication 01/31/2017. Was hospitalized 12/8 for progressive dysphagia with regurgitation with anything PO. Directly admitted 1/29 for same symptoms.   Significant events: 01/30/17: s/p robotic paraesophageal incarcerated hiatal hernia repair and fundoplication. 1/30: Pt reports not tolerating anything x 1 week PTA. Intake poor since November 2018. Diagnosed with moderate malnutrition. 2/1: s/p Laproscopic lysis of Adhesions (N/A) ESOPHAGOGASTRODUODENOSCOPY (EGD) WITH BALLOON DILATION LAPAROSCOPIC GASTRIC  EXPLORATION GASTROSTOMY TUBE PLACEMENT (N/A) ESOPHAGEAL STENT PLACEMENT EXCISION OF FOREIGN BODY X ESOPHAGUS  (N/A) STEROID INJECTION ESOPHAGUS   Pt to begin trickle feeds today per surgery. Jevity 1.2 ordered per TF protocol, RD will  switch to Osmolite 1.5 at 20 ml/hr. Goal rate provided above when able to be advanced. Pt is at risk of refeeding syndrome given poor intakes since November 2018.  Medications: Reglan every 6 hrs per tube, IV Zofran every 6 hours, KCl solution TID Labs reviewed: Low K, Mg  Diet Order:  Diet NPO time specified Except for: [employer], Sips with Meds  EDUCATION NEEDS:   Education needs have been addressed  Skin:  Skin Assessment: Reviewed RN Assessment  Last BM:  PTA  Height:   Ht Readings from Last 1 Encounters:  04/12/17 4\' 10"  (1.473 m)    Weight:   Wt Readings from Last 1 Encounters:  04/13/17 137 lb 12.6 oz (62.5 kg)    Ideal Body Weight:  45.5 kg  BMI:  Body mass index is 28.8 kg/m.  Estimated Nutritional Needs:   Kcal:  1600-1800 kcal/day  Protein:  80-90 g/day  Fluid:  >1.6 L/day  06/11/17, MS, RD, LDN Tilda Franco Inpatient Clinical Dietitian Pager: [phone] After Hours Pager: [phone]

## 2017-04-16 ENCOUNTER — Inpatient Hospital Stay (HOSPITAL_COMMUNITY): Payer: Medicare Other

## 2017-04-16 ENCOUNTER — Encounter (HOSPITAL_COMMUNITY): Payer: Self-pay | Admitting: Surgery

## 2017-04-16 LAB — CBC
HCT: 34.6 % — ABNORMAL LOW (ref 36.0–46.0)
HEMATOCRIT: 36.4 % (ref 36.0–46.0)
HEMOGLOBIN: 12.2 g/dL (ref 12.0–15.0)
Hemoglobin: 11.8 g/dL — ABNORMAL LOW (ref 12.0–15.0)
MCH: 26.4 pg (ref 26.0–34.0)
MCH: 27.1 pg (ref 26.0–34.0)
MCHC: 33.5 g/dL (ref 30.0–36.0)
MCHC: 34.1 g/dL (ref 30.0–36.0)
MCV: 79.4 fL (ref 78.0–100.0)
MCV: 78.8 fL (ref 78.0–100.0)
PLATELETS: 266 10*3/uL (ref 150–400)
Platelets: 301 10*3/uL (ref 150–400)
RBC: 4.36 MIL/uL (ref 3.87–5.11)
RBC: 4.62 MIL/uL (ref 3.87–5.11)
RDW: 15.1 % (ref 11.5–15.5)
RDW: 15.1 % (ref 11.5–15.5)
WBC: 7.4 10*3/uL (ref 4.0–10.5)
WBC: 9.8 10*3/uL (ref 4.0–10.5)

## 2017-04-16 LAB — PREALBUMIN: Prealbumin: 12.7 mg/dL — ABNORMAL LOW (ref 18–38)

## 2017-04-16 LAB — BASIC METABOLIC PANEL
Anion gap: 7 (ref 5–15)
BUN: 7 mg/dL (ref 6–20)
CALCIUM: 8.3 mg/dL — AB (ref 8.9–10.3)
CO2: 31 mmol/L (ref 22–32)
Chloride: 101 mmol/L (ref 101–111)
Creatinine, Ser: 0.48 mg/dL (ref 0.44–1.00)
GFR calc Af Amer: >60 mL/min (ref >60)
GLUCOSE: 138 mg/dL — AB (ref 65–99)
Potassium: 3.5 mmol/L (ref 3.5–5.1)
SODIUM: 139 mmol/L (ref 135–145)

## 2017-04-16 LAB — COMPREHENSIVE METABOLIC PANEL
ALBUMIN: 2.6 g/dL — AB (ref 3.5–5.0)
ALK PHOS: 64 U/L (ref 38–126)
ALT: 16 U/L (ref 14–54)
ANION GAP: 7 (ref 5–15)
AST: 18 U/L (ref 15–41)
BILIRUBIN TOTAL: 1.1 mg/dL (ref 0.3–1.2)
BUN: 7 mg/dL (ref 6–20)
CALCIUM: 8.5 mg/dL — AB (ref 8.9–10.3)
CO2: 31 mmol/L (ref 22–32)
Chloride: 100 mmol/L — ABNORMAL LOW (ref 101–111)
Creatinine, Ser: 0.49 mg/dL (ref 0.44–1.00)
GFR calc Af Amer: >60 mL/min (ref >60)
GLUCOSE: 165 mg/dL — AB (ref 65–99)
Potassium: 3.6 mmol/L (ref 3.5–5.1)
Sodium: 138 mmol/L (ref 135–145)
TOTAL PROTEIN: 5.6 g/dL — AB (ref 6.5–8.1)

## 2017-04-16 LAB — GLUCOSE, CAPILLARY
GLUCOSE-CAPILLARY: 119 mg/dL — AB (ref 65–99)
GLUCOSE-CAPILLARY: 151 mg/dL — AB (ref 65–99)
Glucose-Capillary: 126 mg/dL — ABNORMAL HIGH (ref 65–99)
Glucose-Capillary: 121 mg/dL — ABNORMAL HIGH (ref 65–99)
Glucose-Capillary: 114 mg/dL — ABNORMAL HIGH (ref 65–99)

## 2017-04-16 LAB — MAGNESIUM: Magnesium: 1.5 mg/dL — ABNORMAL LOW (ref 1.7–2.4)

## 2017-04-16 MED ORDER — OSMOLITE 1.5 CAL PO LIQD
1000.0000 mL | ORAL | Status: DC
  Administered 2017-04-16: 1000 mL
  Filled 2017-04-16: qty 1000

## 2017-04-16 MED ORDER — PANTOPRAZOLE SODIUM 40 MG PO PACK
40.0000 mg | PACK | Freq: Two times a day (BID) | ORAL | Status: DC
  Administered 2017-04-16 – 2017-04-20 (×9): 40 mg
  Filled 2017-04-16 (×10): qty 20

## 2017-04-16 MED ORDER — IOPAMIDOL (ISOVUE-300) INJECTION 61%
150.0000 mL | Freq: Once | INTRAVENOUS | Status: AC | PRN
  Administered 2017-04-16: 150 mL via ORAL

## 2017-04-16 MED ORDER — IOPAMIDOL (ISOVUE-300) INJECTION 61%
INTRAVENOUS | Status: AC
  Administered 2017-04-16: 150 mL via ORAL
  Filled 2017-04-16: qty 150

## 2017-04-16 MED ORDER — POLYETHYLENE GLYCOL 3350 17 G PO PACK
17.0000 g | PACK | Freq: Two times a day (BID) | ORAL | Status: DC
  Administered 2017-04-16 – 2017-04-17 (×4): 17 g via ORAL
  Filled 2017-04-16 (×4): qty 1

## 2017-04-16 MED ORDER — SODIUM CHLORIDE 0.9% FLUSH: 3.0000 mL | INTRAVENOUS | Status: DC | PRN

## 2017-04-16 MED ORDER — SODIUM CHLORIDE 0.9 % IV SOLN: 250.0000 mL | INTRAVENOUS | Status: DC | PRN

## 2017-04-16 MED ORDER — METOCLOPRAMIDE HCL 5 MG/5ML PO SOLN
10.0000 mg | Freq: Four times a day (QID) | ORAL | Status: DC
  Administered 2017-04-16 – 2017-04-18 (×6): 10 mg
  Filled 2017-04-16 (×10): qty 10

## 2017-04-16 MED ORDER — FUROSEMIDE 10 MG/ML IJ SOLN
40.0000 mg | Freq: Once | INTRAMUSCULAR | Status: AC
  Administered 2017-04-16: 40 mg via INTRAVENOUS
  Filled 2017-04-16: qty 4

## 2017-04-16 MED ORDER — POTASSIUM CHLORIDE 10 MEQ/100ML IV SOLN
10.0000 meq | INTRAVENOUS | Status: AC
  Administered 2017-04-16: 10 meq via INTRAVENOUS
  Filled 2017-04-16 (×4): qty 100

## 2017-04-16 MED ORDER — LACTATED RINGERS IV BOLUS (SEPSIS): 1000.0000 mL | Freq: Three times a day (TID) | INTRAVENOUS | Status: AC | PRN

## 2017-04-16 MED ORDER — POTASSIUM CHLORIDE 20 MEQ/15ML (10%) PO SOLN
40.0000 meq | Freq: Every day | ORAL | Status: DC
  Administered 2017-04-16: 40 meq
  Filled 2017-04-16 (×2): qty 30

## 2017-04-16 MED ORDER — POTASSIUM CHLORIDE 10 MEQ/100ML IV SOLN
10.0000 meq | INTRAVENOUS | Status: DC
  Filled 2017-04-16 (×4): qty 100

## 2017-04-16 MED ORDER — SODIUM CHLORIDE 0.9% FLUSH
3.0000 mL | Freq: Two times a day (BID) | INTRAVENOUS | Status: DC
  Administered 2017-04-18 – 2017-04-19 (×4): 3 mL via INTRAVENOUS

## 2017-04-16 MED ORDER — IPRATROPIUM BROMIDE 0.02 % IN SOLN
RESPIRATORY_TRACT | Status: AC
  Filled 2017-04-16: qty 2.5

## 2017-04-16 NOTE — Progress Notes (Signed)
[address] [place], [place] [address] Phone: [phone]  FAX: [place] [phone] [birth year]  CARE TEAM:  PCP: Rory Percy, MD  Outpatient Care Team: Patient Care Team: Rory Percy, MD as PCP - General (Family Medicine) Michael Boston, MD as Consulting Physician (General Surgery) Danis, Kirke Corin, MD as Consulting Physician (Gastroenterology)  Inpatient Treatment Team: Treatment Team: Attending Provider: Michael Boston, MD; Registered Nurse: Heloise Ochoa, RN; Technician: Etheleen Sia, NT; Consulting Physician: Alphonsa Overall, MD; Consulting Physician: Edison Pace, Md, MD; Registered Nurse: Candie Chroman, RN; Technician: Leda Quail, NT; Registered Nurse: Vicente Serene, RN   Problem List:   Principal Problem:   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 Type II mediastinal dissection. 3. Primary repair of hiatal hernia over pledgets. 3.5 Mesh reinforcement (Phasix) of hiatal hernia repair 4. Anterior & posterior gastropexy. 5. Nissen fundoplication 2 cm over a 56-French bougie  SURGEON: [name], MD   3 Days Post-Op  04/13/2017  POST-OPERATIVE DIAGNOSIS:    Esophageal stricture at Nissen fundoplication. Erosion of suture and pledgets into the esophagogastric junction. Right  posterior esophagogastric fistula to right posterior fundoplication wrap.    PROCEDURE:    Diagnostic laparoscopy with lysis of adhesions. Trans-gastric intraluminal laparoscopic exploration. Upper endoscopy. Endoscopic excision of suture and pledget foreign body from esophagogastric junction. Balloon dilation of esophageal distal esophageal stricture. Steroid injection of stricture with Kenalog-40 Endoscopic stenting across esophageal stricture. Laparoscopically assisted gastrostomy tube placement  SURGEON:  [name], MD        Assessment  Failure to thrive with esophageal stricture and lateral esophagogastric fistula  Plan:  Esophagram this AM - if no leak, start PO liquids  G-tube feeds - need to get to 100% nutrition.  Increase TF to goal  Bowel regimen  IV antibiotics to help minimize infection progression x 3d postop  IV steroids x 3d postop  Hypokalemia.  Correcting.  Follow with magnesium.  -HTN control -GERD control -VTE prophylaxis- SCDs, etc -mobilize as tolerated to help recovery  75 minutes spent in review, evaluation, examination, counseling, and coordination of care.  More than 50% of that time was spent in counseling.  [name], M.D., F.A.C.S. Gastrointestinal and Minimally Invasive Surgery [facility] [address] [place], [place] [address] [phone] Main / Paging   04/16/2017    Subjective: (Chief complaint)  Tired.  Spit up yesterday.  Not nauseated now.  Husband in room.  Objective:  Vital signs:  Vitals:   04/15/17 1306 04/15/17 2035 04/16/17 0537 04/16/17 0555  BP: 138/83 137/70 133/80   Pulse: 89 93 (!) 102   Resp: _0 Temp: 97.7 F (36.5 C) 98.2 F (36.8 C) 98.8 F (37.1 C)   TempSrc: Oral Oral Oral   SpO2: 97% 97% 96%   Weight:    63.2 kg (139 lb 5.3 oz)  Height:        Last BM Date: 04/12/17  Intake/Output   Yesterday:  02/03 0701 -  02/04 0700 In: 1979 [I.V.:1175; NG/GT:274; IV Piggyback:500] Out: 1970 [BSWHQ:7591; Drains:45] This shift:  No intake/output data recorded.  Bowel function:  Flatus: No  BM:  No  Drain: (No drain)   Physical Exam:  General: Pt awake/alert/oriented x4 in no acute distress.  Sad and mildly anxious but consolable.  Not sickly.  Not toxic.   Eyes: PERRL, normal EOM.  Sclera clear.  No icterus Neuro: CN II-XII intact w/o focal sensory/motor deficits. Lymph: No head/neck/groin lymphadenopathy Psych:  No delerium/psychosis/paranoia HENT: Normocephalic, Mucus membranes moist.  No thrush Neck: Supple, No tracheal deviation Chest: No chest wall pain w good excursion.  No wheezing.  Lungs clear .  no conversational dyspnea. CV:  Pulses intact.  Regular rhythm MS: Normal AROM mjr joints.  No obvious deformity  Abdomen: Soft.  Nondistended.  Mildly tender at incisions only.  Left-sided gastrostomy tube in place without leak.  Tube feeds at 20 mL an hour.  No evidence of peritonitis.  No incarcerated hernias.  Ext:  No deformity.  No mjr edema.  No cyanosis Skin: No petechiae / purpura  Results:   Labs: Results for orders placed or performed during the hospital encounter of 04/10/17 (from the past 48 hour(s))  Basic metabolic panel     Status: Abnormal   Collection Time: 04/14/17  8:46 AM  Result Value Ref Range   Sodium 137 135 - 145 mmol/L   Potassium 3.0 (L) 3.5 - 5.1 mmol/L   Chloride 95 (L) 101 - 111 mmol/L   CO2 31 22 - 32 mmol/L   Glucose, Bld 100 (H) 65 - 99 mg/dL   BUN 5 (L) 6 - 20 mg/dL   Creatinine, Ser 0.49 0.44 - 1.00 mg/dL   Calcium 8.5 (L) 8.9 - 10.3 mg/dL   GFR calc non Af Amer >60 >60 mL/min   GFR calc Af Amer >60 >60 mL/min    Comment: (NOTE) The eGFR has been calculated using the CKD EPI equation. This calculation has not been validated in all clinical situations. eGFR's persistently <60 mL/min signify possible Chronic Kidney Disease.    Anion gap 11 5 - 15     Comment: Performed at [facility], [place] [address], [place], [place] [address]  CBC     Status: Abnormal   Collection Time: 04/14/17  8:46 AM  Result Value Ref Range   WBC 6.9 4.0 - 10.5 K/uL   RBC 4.26 3.87 - 5.11 MIL/uL   Hemoglobin 11.3 (L) 12.0 - 15.0 g/dL   HCT 33.5 (L) 36.0 - 46.0 %   MCV 78.6 78.0 - 100.0 fL   MCH 26.5 26.0 - 34.0 pg   MCHC 33.7 30.0 - 36.0 g/dL   RDW 15.0 11.5 - 15.5 %   Platelets 288 150 - 400 K/uL    Comment: Performed at [facility], [place] [address], [place], [place] [address]  Glucose, capillary     Status: None   Collection Time: 04/14/17  3:33 PM  Result Value Ref Range   Glucose-Capillary 79 65 - 99 mg/dL  Glucose, capillary     Status: None   Collection Time: 04/14/17  7:47 PM  Result Value Ref Range   Glucose-Capillary 82 65 - 99 mg/dL  Glucose, capillary     Status: None   Collection Time: 04/14/17 10:59 PM  Result Value Ref Range  Glucose-Capillary 87 65 - 99 mg/dL  Glucose, capillary     Status: None   Collection Time: 04/15/17  3:37 AM  Result Value Ref Range   Glucose-Capillary 87 65 - 99 mg/dL  Glucose, capillary     Status: None   Collection Time: 04/15/17  8:10 AM  Result Value Ref Range   Glucose-Capillary 78 65 - 99 mg/dL   Comment 1 Notify RN    Comment 2 Document in Chart   Basic metabolic panel     Status: Abnormal   Collection Time: 04/15/17  8:17 AM  Result Value Ref Range   Sodium 139 135 - 145 mmol/L   Potassium 2.6 (LL) 3.5 - 5.1 mmol/L    Comment: CRITICAL RESULT CALLED TO, READ BACK BY AND VERIFIED WITH: R HENLEY,RN 04/15/17 0937 RHOLMES    Chloride 97 (L) 101 - 111 mmol/L   CO2 31 22 - 32 mmol/L   Glucose, Bld 83 65 - 99 mg/dL   BUN <5 (L) 6 - 20 mg/dL   Creatinine, Ser 0.44 0.44 - 1.00 mg/dL   Calcium 8.6 (L) 8.9 - 10.3 mg/dL   GFR calc non Af Amer >60 >60 mL/min   GFR calc Af Amer >60 >60 mL/min    Comment: (NOTE) The eGFR has been calculated using the CKD EPI  equation. This calculation has not been validated in all clinical situations. eGFR's persistently <60 mL/min signify possible Chronic Kidney Disease.    Anion gap 11 5 - 15    Comment: Performed at [facility], [place] [address] [place], [place] [address]  CBC     Status: Abnormal   Collection Time: 04/15/17  8:17 AM  Result Value Ref Range   WBC 7.0 4.0 - 10.5 K/uL   RBC 4.50 3.87 - 5.11 MIL/uL   Hemoglobin 11.9 (L) 12.0 - 15.0 g/dL   HCT 35.7 (L) 36.0 - 46.0 %   MCV 79.3 78.0 - 100.0 fL   MCH 26.4 26.0 - 34.0 pg   MCHC 33.3 30.0 - 36.0 g/dL   RDW 15.0 11.5 - 15.5 %   Platelets 272 150 - 400 K/uL    Comment: Performed at [facility], [place] [address] [place], [place] [address]  Magnesium     Status: Abnormal   Collection Time: 04/15/17  8:27 AM  Result Value Ref Range   Magnesium 1.4 (L) 1.7 - 2.4 mg/dL    Comment: Performed at [facility], [place] [address], [place], [place] [address]  Glucose, capillary     Status: Abnormal   Collection Time: 04/15/17 12:22 PM  Result Value Ref Range   Glucose-Capillary 102 (H) 65 - 99 mg/dL   Comment 1 Notify RN    Comment 2 Document in Chart   Glucose, capillary     Status: None   Collection Time: 04/15/17  4:37 PM  Result Value Ref Range   Glucose-Capillary 96 65 - 99 mg/dL  Glucose, capillary     Status: Abnormal   Collection Time: 04/15/17  8:11 PM  Result Value Ref Range   Glucose-Capillary 112 (H) 65 - 99 mg/dL  Glucose, capillary     Status: Abnormal   Collection Time: 04/15/17 11:17 PM  Result Value Ref Range   Glucose-Capillary 118 (H) 65 - 99 mg/dL  CBC     Status: None   Collection Time: 04/16/17 12:13 AM  Result Value Ref Range   WBC 9.8 4.0 - 10.5 K/uL   RBC  4.62 3.87 - 5.11 MIL/uL   Hemoglobin 12.2 12.0 - 15.0 g/dL   HCT 36.4 36.0 - 46.0 %   MCV 78.8 78.0 - 100.0 fL   MCH 26.4 26.0 - 34.0 pg   MCHC 33.5 30.0 - 36.0 g/dL   RDW 15.1 11.5 - 15.5 %   Platelets  301 150 - 400 K/uL    Comment: Performed at [facility], [place] [address] [place], [place] [address]  Comprehensive metabolic panel     Status: Abnormal   Collection Time: 04/16/17 12:13 AM  Result Value Ref Range   Sodium 138 135 - 145 mmol/L   Potassium 3.6 3.5 - 5.1 mmol/L    Comment: DELTA CHECK NOTED   Chloride 100 (L) 101 - 111 mmol/L   CO2 31 22 - 32 mmol/L   Glucose, Bld 165 (H) 65 - 99 mg/dL   BUN 7 6 - 20 mg/dL   Creatinine, Ser 0.49 0.44 - 1.00 mg/dL   Calcium 8.5 (L) 8.9 - 10.3 mg/dL   Total Protein 5.6 (L) 6.5 - 8.1 g/dL   Albumin 2.6 (L) 3.5 - 5.0 g/dL   AST 18 15 - 41 U/L   ALT 16 14 - 54 U/L   Alkaline Phosphatase 64 38 - 126 U/L   Total Bilirubin 1.1 0.3 - 1.2 mg/dL   GFR calc non Af Amer >60 >60 mL/min   GFR calc Af Amer >60 >60 mL/min    Comment: (NOTE) The eGFR has been calculated using the CKD EPI equation. This calculation has not been validated in all clinical situations. eGFR's persistently <60 mL/min signify possible Chronic Kidney Disease.    Anion gap 7 5 - 15    Comment: Performed at [facility], [place] [address] [place], [place] [address]  Glucose, capillary     Status: Abnormal   Collection Time: 04/16/17  3:58 AM  Result Value Ref Range   Glucose-Capillary 119 (H) 65 - 99 mg/dL  Basic metabolic panel     Status: Abnormal   Collection Time: 04/16/17  4:35 AM  Result Value Ref Range   Sodium 139 135 - 145 mmol/L   Potassium 3.5 3.5 - 5.1 mmol/L   Chloride 101 101 - 111 mmol/L   CO2 31 22 - 32 mmol/L   Glucose, Bld 138 (H) 65 - 99 mg/dL   BUN 7 6 - 20 mg/dL   Creatinine, Ser 0.48 0.44 - 1.00 mg/dL   Calcium 8.3 (L) 8.9 - 10.3 mg/dL   GFR calc non Af Amer >60 >60 mL/min   GFR calc Af Amer >60 >60 mL/min    Comment: (NOTE) The eGFR has been calculated using the CKD EPI equation. This calculation has not been validated in all clinical situations. eGFR's persistently <60 mL/min signify possible Chronic  Kidney Disease.    Anion gap 7 5 - 15    Comment: Performed at [facility], [place] [address], [place], [place] [address]  CBC     Status: Abnormal   Collection Time: 04/16/17  4:35 AM  Result Value Ref Range   WBC 7.4 4.0 - 10.5 K/uL   RBC 4.36 3.87 - 5.11 MIL/uL   Hemoglobin 11.8 (L) 12.0 - 15.0 g/dL   HCT 34.6 (L) 36.0 - 46.0 %   MCV 79.4 78.0 - 100.0 fL   MCH 27.1 26.0 - 34.0 pg   MCHC 34.1 30.0 - 36.0 g/dL   RDW 15.1 11.5 - 15.5 %  Platelets 266 150 - 400 K/uL    Comment: Performed at [facility], [place] [address], [place], [place] [address]    Imaging / Studies: No results found.  Medications / Allergies: per chart  Antibiotics: Anti-infectives (From admission, onward)   Start     Dose/Rate Route Frequency Ordered Stop   04/13/17 0900  cefTRIAXone (ROCEPHIN) 2 g in dextrose 5 % 50 mL IVPB     2 g 100 mL/hr over 30 Minutes Intravenous On call to O.R. 04/13/17 0856 04/13/17 1449   04/13/17 0900  metroNIDAZOLE (FLAGYL) IVPB 500 mg     500 mg 100 mL/hr over 60 Minutes Intravenous Every 8 hours 04/13/17 0856     04/11/17 1800  piperacillin-tazobactam (ZOSYN) IVPB 3.375 g     3.375 g 12.5 mL/hr over 240 Minutes Intravenous Every 8 hours 04/11/17 1750 04/15/17 0959        Note: Portions of this report may have been transcribed using voice recognition software. Every effort was made to ensure accuracy; however, inadvertent computerized transcription errors may be present.   Any transcriptional errors that result from this process are unintentional.     [name], M.D., F.A.C.S. Gastrointestinal and Minimally Invasive Surgery [facility] [address] [place], [place] [address] [phone] Main / Paging   04/16/2017

## 2017-04-16 NOTE — Care Management Important Message (Signed)
Important Message  Patient Details  Name: Sydney [name] MRN: [id number] Date of Birth: 1945/12/30   Medicare Important Message Given:  Yes    Caren Macadam 04/16/2017, 10:22 AM

## 2017-04-16 NOTE — Evaluation (Signed)
Physical Therapy Evaluation Patient Details Name: MAJESTIC MOLONY MRN: [id number] DOB: April 04, 1945 Today's Date: 04/16/2017   History of Present Illness  72 yo female admitted 04/10/17 with inability to swallow, weight loss. 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  Clinical Impression  The patient is mobilizing well.ambulated x 250' with RW. Able to ambulate with staff anytime. Pt admitted with above diagnosis. Pt currently with functional limitations due to the deficits listed below (see PT Problem List).  Pt will benefit from skilled PT to increase their independence and safety with mobility to allow discharge to the venue listed below.       Follow Up Recommendations No PT follow up    Equipment Recommendations  None recommended by PT    Recommendations for Other Services       Precautions / Restrictions Precautions Precaution Comments: NPO, JP drain, PEG      Mobility  Bed Mobility Overal bed mobility: Needs Assistance Bed Mobility: Rolling;Sidelying to Sit Rolling: Min assist Sidelying to sit: Min assist       General bed mobility comments: assist with trunk to sitting  Transfers Overall transfer level: Needs assistance Equipment used: Rolling walker (2 wheeled) Transfers: Sit to/from Stand Sit to Stand: Min guard         General transfer comment: 250  Ambulation/Gait Ambulation/Gait assistance: Min guard   Assistive device: Rolling walker (2 wheeled) Gait Pattern/deviations: Step-through pattern     General Gait Details: patient moves slowly and is steady.   Stairs            Wheelchair Mobility    Modified Rankin (Stroke Patients Only)       Balance                                             Pertinent Vitals/Pain Pain Assessment: 0-10 Pain Score: 4  Pain Location: abdomen Pain Descriptors / Indicators: Discomfort Pain  Intervention(s): Monitored during session;Premedicated before session    Home Living Family/patient expects to be discharged to:: Private residence Living Arrangements: Spouse/significant other Available Help at Discharge: Family Type of Home: House Home Access: Stairs to enter   Secretary/administrator of Steps: 1 Home Layout: One level Home Equipment: Environmental consultant - 2 wheels      Prior Function Level of Independence: Independent               Hand Dominance        Extremity/Trunk Assessment   Upper Extremity Assessment Upper Extremity Assessment: Generalized weakness    Lower Extremity Assessment Lower Extremity Assessment: Generalized weakness       Communication   Communication: No difficulties  Cognition Arousal/Alertness: Awake/alert Behavior During Therapy: WFL for tasks assessed/performed Overall Cognitive Status: Within Functional Limits for tasks assessed                                        General Comments      Exercises     Assessment/Plan    PT Assessment Patient needs continued PT services  PT Problem List Decreased strength;Decreased activity tolerance;Pain       PT Treatment Interventions DME instruction;Gait training;Therapeutic activities;Functional mobility training;Patient/family education    PT Goals (Current goals can be found in  the Care Plan section)  Acute Rehab PT Goals Patient Stated Goal: to go home,  PT Goal Formulation: With patient/family Time For Goal Achievement: 04/30/17 Potential to Achieve Goals: Good    Frequency Min 2X/week   Barriers to discharge        Co-evaluation               AM-PAC PT "6 Clicks" Daily Activity  Outcome Measure Difficulty turning over in bed (including adjusting bedclothes, sheets and blankets)?: A Little Difficulty moving from lying on back to sitting on the side of the bed? : A Little Difficulty sitting down on and standing up from a chair with arms (e.g.,  wheelchair, bedside commode, etc,.)?: A Little Help needed moving to and from a bed to chair (including a wheelchair)?: A Little Help needed walking in hospital room?: A Little Help needed climbing 3-5 steps with a railing? : A Lot 6 Click Score: 17    End of Session   Activity Tolerance: Patient tolerated treatment well Patient left: in chair;with call bell/phone within reach;with family/visitor present Nurse Communication: Mobility status PT Visit Diagnosis: Unsteadiness on feet (R26.81)    Time: 8527-7824 PT Time Calculation (min) (ACUTE ONLY): 10 min   Charges:   PT Evaluation $PT Eval Low Complexity: 1 Low     PT G CodesBlanchard Kelch PT [phone]   Rada Hay 04/16/2017, 2:46 PM

## 2017-04-17 LAB — GLUCOSE, CAPILLARY
GLUCOSE-CAPILLARY: 108 mg/dL — AB (ref 65–99)
GLUCOSE-CAPILLARY: 111 mg/dL — AB (ref 65–99)
GLUCOSE-CAPILLARY: 113 mg/dL — AB (ref 65–99)
GLUCOSE-CAPILLARY: 115 mg/dL — AB (ref 65–99)
GLUCOSE-CAPILLARY: 120 mg/dL — AB (ref 65–99)
GLUCOSE-CAPILLARY: 98 mg/dL (ref 65–99)
Glucose-Capillary: 112 mg/dL — ABNORMAL HIGH (ref 65–99)

## 2017-04-17 LAB — PHOSPHORUS: Phosphorus: 1.1 mg/dL — ABNORMAL LOW (ref 2.5–4.6)

## 2017-04-17 LAB — BASIC METABOLIC PANEL
Anion gap: 8 (ref 5–15)
BUN: 7 mg/dL (ref 6–20)
CALCIUM: 8.4 mg/dL — AB (ref 8.9–10.3)
CHLORIDE: 103 mmol/L (ref 101–111)
CO2: 27 mmol/L (ref 22–32)
CREATININE: 0.41 mg/dL — AB (ref 0.44–1.00)
GFR calc Af Amer: >60 mL/min (ref >60)
GFR calc non Af Amer: >60 mL/min (ref >60)
GLUCOSE: 125 mg/dL — AB (ref 65–99)
Potassium: 3.3 mmol/L — ABNORMAL LOW (ref 3.5–5.1)
Sodium: 138 mmol/L (ref 135–145)

## 2017-04-17 LAB — CBC
HCT: 36.1 % (ref 36.0–46.0)
Hemoglobin: 11.8 g/dL — ABNORMAL LOW (ref 12.0–15.0)
MCH: 26.4 pg (ref 26.0–34.0)
MCHC: 32.7 g/dL (ref 30.0–36.0)
MCV: 80.8 fL (ref 78.0–100.0)
PLATELETS: 293 10*3/uL (ref 150–400)
RBC: 4.47 MIL/uL (ref 3.87–5.11)
RDW: 15.3 % (ref 11.5–15.5)
WBC: 8.1 10*3/uL (ref 4.0–10.5)

## 2017-04-17 LAB — AMYLASE: Amylase: 41 U/L (ref 28–100)

## 2017-04-17 LAB — MAGNESIUM: MAGNESIUM: 1.4 mg/dL — AB (ref 1.7–2.4)

## 2017-04-17 MED ORDER — BOOST / RESOURCE BREEZE PO LIQD CUSTOM: 1.0000 | ORAL | Status: DC

## 2017-04-17 MED ORDER — SODIUM CHLORIDE 0.9 % IV SOLN: 250.0000 mL | INTRAVENOUS | Status: DC | PRN

## 2017-04-17 MED ORDER — SODIUM CHLORIDE 0.9% FLUSH: 3.0000 mL | INTRAVENOUS | Status: DC | PRN

## 2017-04-17 MED ORDER — K PHOS MONO-SOD PHOS DI & MONO 155-852-130 MG PO TABS
500.0000 mg | ORAL_TABLET | Freq: Two times a day (BID) | ORAL | Status: DC
  Administered 2017-04-17 (×2): 500 mg
  Filled 2017-04-17 (×3): qty 2

## 2017-04-17 MED ORDER — MAGNESIUM SULFATE 4 GM/100ML IV SOLN
4.0000 g | Freq: Once | INTRAVENOUS | Status: AC
  Administered 2017-04-17: 4 g via INTRAVENOUS
  Filled 2017-04-17: qty 100

## 2017-04-17 MED ORDER — ENSURE SURGERY PO LIQD: 237.0000 mL | Freq: Every day | ORAL | Status: DC

## 2017-04-17 MED ORDER — SODIUM CHLORIDE 0.9% FLUSH
3.0000 mL | Freq: Two times a day (BID) | INTRAVENOUS | Status: DC
  Administered 2017-04-17: 3 mL via INTRAVENOUS

## 2017-04-17 MED ORDER — UNJURY CHICKEN SOUP POWDER
2.0000 [oz_av] | Freq: Three times a day (TID) | ORAL | Status: DC
  Administered 2017-04-17: 2 [oz_av] via ORAL

## 2017-04-17 MED ORDER — ENSURE SURGERY PO LIQD
237.0000 mL | Freq: Two times a day (BID) | ORAL | Status: DC
  Filled 2017-04-17 (×2): qty 237

## 2017-04-17 MED ORDER — SODIUM CHLORIDE 0.9% FLUSH: 3.0000 mL | Freq: Two times a day (BID) | INTRAVENOUS | Status: DC

## 2017-04-17 MED ORDER — POTASSIUM CHLORIDE 20 MEQ/15ML (10%) PO SOLN
40.0000 meq | Freq: Two times a day (BID) | ORAL | Status: DC
  Filled 2017-04-17: qty 30

## 2017-04-17 NOTE — Evaluation (Signed)
Occupational Therapy Evaluation Patient Details Name: Sydney [name] MRN: [id number] DOB: [birth year] Today's Date: 04/17/2017    History of Present Illness 72 yo female admitted 04/10/17 with inability to swallow, weight loss. 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   Clinical Impression   Pt was admitted for the above.  Will follow in acute setting to increase endurance for adls and further educate on energy conservation techniques.  Goals are for supervision level in acute.  Husband can assist with adls as needed until pain decreases enough for her to do these on her own    Follow Up Recommendations  No OT follow up    Equipment Recommendations  None recommended by OT    Recommendations for Other Services       Precautions / Restrictions Precautions Precaution Comments: JP drain Restrictions Weight Bearing Restrictions: No      Mobility Bed Mobility               General bed mobility comments: oob  Transfers   Equipment used: Rolling walker (2 wheeled)   Sit to Stand: Supervision              Balance                                           ADL either performed or assessed with clinical judgement   ADL Overall ADL's : Needs assistance/impaired             Lower Body Bathing: Moderate assistance;Sit to/from stand       Lower Body Dressing: Moderate assistance;Sit to/from stand   Toilet Transfer: Min guard;Ambulation             General ADL Comments: pt can perform UB adls with set up. She is limited by pain for LB adls. She usually crosses legs and wears slip on shoes. She states her husband can assist as needed. Educated on IT trainer      Pertinent Vitals/Pain Pain Score: 4  Pain Location: abdomen Pain Descriptors / Indicators: Discomfort Pain Intervention(s): Limited  activity within patient's tolerance;Monitored during session;Repositioned     Hand Dominance     Extremity/Trunk Assessment Upper Extremity Assessment Upper Extremity Assessment: Generalized weakness           Communication Communication Communication: No difficulties   Cognition Arousal/Alertness: Awake/alert Behavior During Therapy: WFL for tasks assessed/performed Overall Cognitive Status: Within Functional Limits for tasks assessed                                     General Comments  pt ambulated in hall with supervision/min guard using RW.  C/O lightheadedness.  BP 130/70 sitting back in room.  Dyspnea 2/4    Exercises     Shoulder Instructions      Home Living Family/patient expects to be discharged to:: Private residence Living Arrangements: Spouse/significant other Available Help at Discharge: Family               Bathroom Shower/Tub: Chief Strategy Officer: Standard     Home Equipment: Environmental consultant - 2 wheels   Additional Comments: pt is 4'10"  Prior Functioning/Environment Level of Independence: Independent                 OT Problem List: Decreased strength;Decreased activity tolerance;Pain;Decreased knowledge of use of DME or AE      OT Treatment/Interventions: Self-care/ADL training;DME and/or AE instruction;Patient/family education    OT Goals(Current goals can be found in the care plan section) Acute Rehab OT Goals Patient Stated Goal: to go home,  OT Goal Formulation: With patient Time For Goal Achievement: 05/01/17 Potential to Achieve Goals: Good ADL Goals Pt Will Transfer to Toilet: with supervision;ambulating;regular height toilet Pt Will Perform Toileting - Clothing Manipulation and hygiene: with supervision;sit to/from stand Additional ADL Goal #1: pt will perform bed mobility from flat bed at supervision level in preparation for adls Additional ADL Goal #2: pt will verbalize 2 energy conservation  techniques and initiate at least one rest break for adls  OT Frequency: Min 2X/week   Barriers to D/C:            Co-evaluation              AM-PAC PT "6 Clicks" Daily Activity     Outcome Measure Help from another person eating meals?: None Help from another person taking care of personal grooming?: A Little Help from another person toileting, which includes using toliet, bedpan, or urinal?: A Little Help from another person bathing (including washing, rinsing, drying)?: A Lot Help from another person to put on and taking off regular upper body clothing?: A Little Help from another person to put on and taking off regular lower body clothing?: A Lot 6 Click Score: 17   End of Session    Activity Tolerance: Patient limited by fatigue Patient left: in chair;with call bell/phone within reach;with family/visitor present  OT Visit Diagnosis: Muscle weakness (generalized) (M62.81)                Time: 6759-1638 OT Time Calculation (min): 16 min Charges:  OT General Charges $OT Visit: 1 Visit OT Evaluation $OT Eval Low Complexity: 1 Low G-Codes:     [place], OTR/L [phone] 04/17/2017  Charna Neeb 04/17/2017, 2:01 PM

## 2017-04-17 NOTE — Progress Notes (Signed)
Nutrition Follow-up  DOCUMENTATION CODES:   Non-severe (moderate) malnutrition in context of acute illness/injury  INTERVENTION:    Monitor for diet advancement/toleration  Unjury chicken Soup TID, Each serving provides 100kcal and 21g protein   Boost Breeze po Q24, each supplement provides 250 kcal and 9 grams of protein  Monitor magnesium, potassium, and phosphorus daily for at least 3 days, MD to replete as needed, as pt is at risk for refeeding syndrome.  NUTRITION DIAGNOSIS:   Moderate Malnutrition related to acute illness(nissen fundoplication complications) as evidenced by percent weight loss, energy intake < or equal to 50% for > or equal to 1 month, mild fat depletion, mild muscle depletion.  Ongoing  GOAL:   Patient will meet greater than or equal to 90% of their needs  Not meeting- TF d/c  MONITOR:   PO intake, Supplement acceptance, Diet advancement, Weight trends, Labs  REASON FOR ASSESSMENT:   Consult Assessment of nutrition requirement/status, Enteral/tube feeding initiation and management  ASSESSMENT:   Pt with PMH significant for large hiatal hernia s/p robotic repair with Nissen fundoplication 01/31/2017. Was hospitalized 12/8 for progressive dysphagia with regurgitation with anything PO. Directly admitted 1/29 for same symptoms.    01/31/17- s/p robotic paraesophageal incarcerated hiatal hernia repair and fundoplication. 04/11/17- Pt reports not tolerating anything x 1 week PTA. Intake poor since November. Diagnosed with moderate malnutrition. 04/13/17- s/p Laproscopic lysis of Adhesions, EGD with balloon dilation, G- placement, excision of foreign body esophagus  RD consulted for calorie count, placed envelope on door. Should run from lunch 2/5 to lunch 2/7. Spoke with pt at bedside. MD told pt this morning her diet is to be advanced past clear liquids this afternoon. Pt declines Ensure but is willing to try Unjury chicken soup and Boost Breeze. Tube  feeding via G tube has been discontinued. Pt remains at risk for refeeding. Weight noted to trend up two pound since last RD visit 2/3.   Medications reviewed and include: reglan, K Phos, Mag sulfate, IV abx Labs reviewed: K 3.3 (L) Phos 1.1 (L) Mag 1.4 (L)  Diet Order:  Diet clear liquid Room service appropriate? Yes; Fluid consistency: Thin  EDUCATION NEEDS:   Education needs have been addressed  Skin:  Skin Assessment: Reviewed RN Assessment  Last BM:  2/519  Height:   Ht Readings from Last 1 Encounters:  04/12/17 4\' 10"  (1.473 m)    Weight:   Wt Readings from Last 1 Encounters:  04/16/17 139 lb 5.3 oz (63.2 kg)    Ideal Body Weight:  45.5 kg  BMI:  Body mass index is 29.12 kg/m.  Estimated Nutritional Needs:   Kcal:  1600-1800 kcal/day  Protein:  80-90 g/day  Fluid:  >1.6 L/day    06/14/17 RD, LDN Clinical Nutrition Pager # - [phone]

## 2017-04-17 NOTE — Progress Notes (Signed)
[address] [place], [place] [address] Phone: [phone]  FAX: [phone]      Sydney [name] [id number] 13-Dec-1945  CARE TEAM:  PCP: Rory Percy, MD  Outpatient Care Team: Patient Care Team: Rory Percy, MD as PCP - General (Family Medicine) Michael Boston, MD as Consulting Physician (General Surgery) Keenes, Kirke Corin, MD as Consulting Physician (Gastroenterology)  Inpatient Treatment Team: Treatment Team: Attending Provider: Michael Boston, MD; Registered Nurse: Heloise Ochoa, RN; Technician: Etheleen Sia, NT; Consulting Physician: Alphonsa Overall, MD; Consulting Physician: Nolon Nations, MD; Technician: Leda Quail, NT; Registered Nurse: Vicente Serene, RN; Occupational Therapist: Lesle Chris, OT; Registered Nurse: Mortimer Fries, RN   Problem List:   Principal Problem:   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 Type II mediastinal dissection. 3. Primary repair of hiatal hernia over pledgets. 3.5 Mesh reinforcement (Phasix) of hiatal hernia repair 4. Anterior & posterior gastropexy. 5. Nissen fundoplication 2 cm over a 56-French bougie  SURGEON: [name], MD   4 Days Post-Op  04/13/2017  POST-OPERATIVE DIAGNOSIS:    Esophageal stricture at Nissen fundoplication. Erosion of suture and  pledgets into the esophagogastric junction. 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    PROCEDURE:    Diagnostic laparoscopy with lysis of adhesions. Trans-gastric intraluminal laparoscopic exploration. Upper endoscopy. Endoscopic excision of suture and pledget foreign body from esophagogastric junction. Balloon dilation of esophageal distal esophageal stricture. Steroid injection of stricture with Kenalog-40 Endoscopic stenting across esophageal stricture. Laparoscopically assisted gastrostomy tube placement  SURGEON:  [name], MD        Assessment  Failure to thrive with esophageal stricture and lateral esophagogastric fistula - controlled  Plan:  Esophagram showed no leak.  Starting clears.  Advance to pured as tolerated.  Switch G-tube feeds to bolus feeds.  2-3 "cans" a day to make sure she gets 100% nutrition.  Calorie counts.    Bowel regimen.  Somewhat loose.  Add fiber.  Send drain fluid for amylase.  If no evidence of elevated amylase, removed.  No obvious leak on esophagram but would like to double check.  IV antibiotics to help minimize infection progression x 3d postop  IV steroids x 3d postop  Hypokalemia.  Correcting.  Follow with magnesium.  -HTN control -GERD control -VTE prophylaxis- SCDs, etc -mobilize as tolerated to help recovery  75 minutes spent in review, evaluation, examination, counseling, and coordination of care.  More than 50% of that time was spent in counseling.  [name], M.D., F.A.C.S. Gastrointestinal and Minimally Invasive Surgery [facility] [address], [place], [place] [address] [phone] Main / Paging   04/17/2017    Subjective: (Chief complaint)  Tired.  Had a few loose bowel moments yesterday.  Tolerated a few sips.  Husband in room.  Objective:  Vital signs:  Vitals:   04/16/17 1400 04/16/17 2306  04/17/17 0630 04/17/17 0725  BP: 132/78 138/77 126/68 (!) 125/59  Pulse: 80 87 85 90  Resp: 17 18 18 20   Temp: 98.6 F (37 C) 98.4 F (36.9 C) 97.8 F (36.6 C) 98.7 F (37.1 C)  TempSrc: Oral Oral Oral Axillary  SpO2: 98% 98% 96% 97%  Weight:      Height:        Last BM Date: 04/12/17  Intake/Output   Yesterday:  02/04 0701 - 02/05 0700 In: 1544.2 [P.O.:240; NG/GT:1004.2; IV Piggyback:300] Out: 1050 [Urine:1000; Drains:50] This shift:  No intake/output data recorded.  Bowel function:  Flatus: No  BM:  No  Drain: (No drain)   Physical Exam:  General: Pt awake/alert/oriented x4 in no acute distress.  Sad and mildly anxious but consolable.  Not sickly.  Not toxic.   Eyes: PERRL, normal EOM.  Sclera clear.  No icterus Neuro: CN II-XII intact w/o focal sensory/motor deficits. Lymph: No head/neck/groin lymphadenopathy Psych:  No delerium/psychosis/paranoia HENT: Normocephalic, Mucus membranes moist.  No thrush Neck: Supple, No tracheal deviation Chest: No chest wall pain w good excursion.  No wheezing.  Lungs clear .  no conversational dyspnea. CV:  Pulses intact.  Regular rhythm MS: Normal AROM mjr joints.  No obvious deformity  Abdomen: Soft.  Nondistended.  Mildly tender at incisions only.  Left-sided gastrostomy tube in place without leak.  Tube feeds at 20 mL an hour - not advanced.  No evidence of peritonitis.  No incarcerated hernias.  Ext:  No deformity.  No mjr edema.  No cyanosis Skin: No petechiae / purpura  Results:   Labs: Results for orders placed or performed during the hospital encounter of 04/10/17 (from the past 48 hour(s))  Glucose, capillary     Status: None   Collection Time: 04/15/17  8:10 AM  Result Value Ref Range   Glucose-Capillary 78 65 - 99 mg/dL   Comment 1 Notify RN    Comment 2 Document in Chart   Basic metabolic panel     Status: Abnormal   Collection Time: 04/15/17  8:17 AM  Result Value Ref Range   Sodium 139 135 - 145 mmol/L    Potassium 2.6 (LL) 3.5 - 5.1 mmol/L    Comment: CRITICAL RESULT CALLED TO, READ BACK BY AND VERIFIED WITH: R HENLEY,RN 04/15/17 0937 RHOLMES    Chloride 97 (L) 101 - 111 mmol/L   CO2 31 22 - 32 mmol/L   Glucose, Bld 83 65 - 99 mg/dL   BUN <5 (L) 6 - 20 mg/dL   Creatinine, Ser 0.44 0.44 - 1.00 mg/dL   Calcium 8.6 (L) 8.9 - 10.3 mg/dL   GFR calc non Af Amer >60 >60 mL/min   GFR calc Af Amer >60 >60 mL/min    Comment: (NOTE) The eGFR has been calculated using the CKD EPI equation. This calculation has not been validated in all clinical situations. eGFR's persistently <60 mL/min signify possible Chronic Kidney Disease.    Anion gap 11 5 - 15    Comment: Performed at [facility], [place] [address], [place], [place] [address]  CBC     Status: Abnormal   Collection Time: 04/15/17  8:17 AM  Result Value Ref Range   WBC 7.0 4.0 - 10.5 K/uL   RBC 4.50 3.87 - 5.11 MIL/uL   Hemoglobin 11.9 (L) 12.0 - 15.0 g/dL   HCT 35.7 (L) 36.0 - 46.0 %   MCV 79.3 78.0 - 100.0 fL   MCH 26.4 26.0 - 34.0 pg  MCHC 33.3 30.0 - 36.0 g/dL   RDW 15.0 11.5 - 15.5 %   Platelets 272 150 - 400 K/uL    Comment: Performed at [facility], [place] [address], [place], [place] [address]  Magnesium     Status: Abnormal   Collection Time: 04/15/17  8:27 AM  Result Value Ref Range   Magnesium 1.4 (L) 1.7 - 2.4 mg/dL    Comment: Performed at [facility], [place] [address], [place], [place] [address]  Glucose, capillary     Status: Abnormal   Collection Time: 04/15/17 12:22 PM  Result Value Ref Range   Glucose-Capillary 102 (H) 65 - 99 mg/dL   Comment 1 Notify RN    Comment 2 Document in Chart   Glucose, capillary     Status: None   Collection Time: 04/15/17  4:37 PM  Result Value Ref Range   Glucose-Capillary 96 65 - 99 mg/dL  Glucose, capillary     Status: Abnormal   Collection Time: 04/15/17  8:11 PM  Result Value Ref Range   Glucose-Capillary 112 (H) 65 - 99  mg/dL  Glucose, capillary     Status: Abnormal   Collection Time: 04/15/17 11:17 PM  Result Value Ref Range   Glucose-Capillary 118 (H) 65 - 99 mg/dL  Prealbumin     Status: Abnormal   Collection Time: 04/16/17 12:13 AM  Result Value Ref Range   Prealbumin 12.7 (L) 18 - 38 mg/dL    Comment: Performed at [facility], [place] [address], [place], [place] [address]  CBC     Status: None   Collection Time: 04/16/17 12:13 AM  Result Value Ref Range   WBC 9.8 4.0 - 10.5 K/uL   RBC 4.62 3.87 - 5.11 MIL/uL   Hemoglobin 12.2 12.0 - 15.0 g/dL   HCT 36.4 36.0 - 46.0 %   MCV 78.8 78.0 - 100.0 fL   MCH 26.4 26.0 - 34.0 pg   MCHC 33.5 30.0 - 36.0 g/dL   RDW 15.1 11.5 - 15.5 %   Platelets 301 150 - 400 K/uL    Comment: Performed at [facility], [place] [address] [place],  [address]  Comprehensive metabolic panel     Status: Abnormal   Collection Time: 04/16/17 12:13 AM  Result Value Ref Range   Sodium 138 135 - 145 mmol/L   Potassium 3.6 3.5 - 5.1 mmol/L    Comment: DELTA CHECK NOTED   Chloride 100 (L) 101 - 111 mmol/L   CO2 31 22 - 32 mmol/L   Glucose, Bld 165 (H) 65 - 99 mg/dL   BUN 7 6 - 20 mg/dL   Creatinine, Ser 0.49 0.44 - 1.00 mg/dL   Calcium 8.5 (L) 8.9 - 10.3 mg/dL   Total Protein 5.6 (L) 6.5 - 8.1 g/dL   Albumin 2.6 (L) 3.5 - 5.0 g/dL   AST 18 15 - 41 U/L   ALT 16 14 - 54 U/L   Alkaline Phosphatase 64 38 - 126 U/L   Total Bilirubin 1.1 0.3 - 1.2 mg/dL   GFR calc non Af Amer >60 >60 mL/min   GFR calc Af Amer >60 >60 mL/min    Comment: (NOTE) The eGFR has been calculated using the CKD EPI equation. This calculation has not been validated in all clinical situations. eGFR's persistently <60 mL/min signify possible Chronic Kidney Disease.    Anion gap 7 5 - 15    Comment: Performed at [facility], [place] [address], [place], [place] [address]  Glucose, capillary     Status: Abnormal   Collection Time: 04/16/17  3:58 AM  Result  Value Ref Range   Glucose-Capillary 119 (H) 65 - 99 mg/dL  Basic metabolic panel     Status: Abnormal   Collection Time: 04/16/17  4:35 AM  Result Value Ref Range   Sodium 139 135 - 145 mmol/L   Potassium 3.5 3.5 - 5.1 mmol/L   Chloride 101 101 - 111 mmol/L   CO2 31 22 - 32 mmol/L   Glucose, Bld 138 (H) 65 - 99 mg/dL   BUN 7 6 - 20 mg/dL   Creatinine, Ser 0.48 0.44 - 1.00 mg/dL   Calcium 8.3 (L) 8.9 - 10.3 mg/dL   GFR calc non Af Amer >60 >60 mL/min   GFR calc Af Amer >60 >60 mL/min    Comment: (NOTE) The eGFR has been calculated using the CKD EPI equation. This calculation has not been validated in all clinical situations. eGFR's persistently <60 mL/min signify possible Chronic Kidney Disease.    Anion gap 7 5 - 15    Comment: Performed at [facility], [place] [address], [place], [place] [address]  CBC     Status: Abnormal   Collection Time: 04/16/17  4:35 AM  Result Value Ref Range   WBC 7.4 4.0 - 10.5 K/uL   RBC 4.36 3.87 - 5.11 MIL/uL   Hemoglobin 11.8 (L) 12.0 - 15.0 g/dL   HCT 34.6 (L) 36.0 - 46.0 %   MCV 79.4 78.0 - 100.0 fL   MCH 27.1 26.0 - 34.0 pg   MCHC 34.1 30.0 - 36.0 g/dL   RDW 15.1 11.5 - 15.5 %   Platelets 266 150 - 400 K/uL    Comment: Performed at [facility], [place] [address] [place], [place] [address]  Magnesium     Status: Abnormal   Collection Time: 04/16/17  4:35 AM  Result Value Ref Range   Magnesium 1.5 (L) 1.7 - 2.4 mg/dL    Comment: Performed at [facility], [place] [address], [place], [place] [address]  Glucose, capillary     Status: Abnormal   Collection Time: 04/16/17  7:40 AM  Result Value Ref Range   Glucose-Capillary 151 (H) 65 - 99 mg/dL  Glucose, capillary     Status: Abnormal   Collection Time: 04/16/17 11:58 AM  Result Value Ref Range   Glucose-Capillary 126 (H) 65 - 99 mg/dL  Glucose, capillary     Status: Abnormal   Collection Time: 04/16/17  4:21 PM  Result Value Ref Range    Glucose-Capillary 121 (H) 65 - 99 mg/dL  Glucose, capillary     Status: Abnormal   Collection Time: 04/16/17  8:35 PM  Result Value Ref Range   Glucose-Capillary 114 (H) 65 - 99 mg/dL  Glucose, capillary     Status: Abnormal   Collection Time: 04/17/17 12:29 AM  Result Value Ref Range   Glucose-Capillary 115 (H) 65 - 99 mg/dL  Glucose, capillary     Status: Abnormal   Collection Time: 04/17/17  4:32 AM  Result Value Ref Range   Glucose-Capillary 112 (H) 65 - 99 mg/dL  Basic metabolic panel     Status: Abnormal   Collection Time: 04/17/17  5:15 AM  Result Value Ref Range   Sodium 138 135 - 145 mmol/L   Potassium 3.3 (L) 3.5 - 5.1 mmol/L   Chloride 103 101 -  111 mmol/L   CO2 27 22 - 32 mmol/L   Glucose, Bld 125 (H) 65 - 99 mg/dL   BUN 7 6 - 20 mg/dL   Creatinine, Ser 0.41 (L) 0.44 - 1.00 mg/dL   Calcium 8.4 (L) 8.9 - 10.3 mg/dL   GFR calc non Af Amer >60 >60 mL/min   GFR calc Af Amer >60 >60 mL/min    Comment: (NOTE) The eGFR has been calculated using the CKD EPI equation. This calculation has not been validated in all clinical situations. eGFR's persistently <60 mL/min signify possible Chronic Kidney Disease.    Anion gap 8 5 - 15    Comment: Performed at [facility], [place] [address] [place], [place] [address]  CBC     Status: Abnormal   Collection Time: 04/17/17  5:15 AM  Result Value Ref Range   WBC 8.1 4.0 - 10.5 K/uL   RBC 4.47 3.87 - 5.11 MIL/uL   Hemoglobin 11.8 (L) 12.0 - 15.0 g/dL   HCT 36.1 36.0 - 46.0 %   MCV 80.8 78.0 - 100.0 fL   MCH 26.4 26.0 - 34.0 pg   MCHC 32.7 30.0 - 36.0 g/dL   RDW 15.3 11.5 - 15.5 %   Platelets 293 150 - 400 K/uL    Comment: Performed at [facility], [place] [address], [place], [place] [address]    Imaging / Studies: Dg Esophagus W/water Sol Cm  Result Date: 04/16/2017 CLINICAL DATA:  Postop Nissen fundoplication repair and esophageal stenting EXAM: ESOPHOGRAM/BARIUM SWALLOW TECHNIQUE: Single  contrast examination was performed using  75 mL Isovue 300. FLUOROSCOPY TIME:  Fluoroscopy Time:  1.3 min Radiation Exposure Index (if provided by the fluoroscopic device): 26 mGy Number of Acquired Spot Images: 0 COMPARISON:  None. FINDINGS: Fluoroscopic evaluation of swallowing demonstrates widely patent distal esophageal stent. No evidence of leak or extravasation. Slow emptying of the stomach, likely related to postoperative gastroparesis. IMPRESSION: Distal esophageal stent widely patent.  No evidence of leak. Electronically Signed   By: Rolm Baptise M.D.   On: 04/16/2017 10:45    Medications / Allergies: per chart  Antibiotics: Anti-infectives (From admission, onward)   Start     Dose/Rate Route Frequency Ordered Stop   04/13/17 0900  cefTRIAXone (ROCEPHIN) 2 g in dextrose 5 % 50 mL IVPB     2 g 100 mL/hr over 30 Minutes Intravenous On call to O.R. 04/13/17 0856 04/13/17 1449   04/13/17 0900  metroNIDAZOLE (FLAGYL) IVPB 500 mg     500 mg 100 mL/hr over 60 Minutes Intravenous Every 8 hours 04/13/17 0856     04/11/17 1800  piperacillin-tazobactam (ZOSYN) IVPB 3.375 g     3.375 g 12.5 mL/hr over 240 Minutes Intravenous Every 8 hours 04/11/17 1750 04/15/17 0959        Note: Portions of this report may have been transcribed using voice recognition software. Every effort was made to ensure accuracy; however, inadvertent computerized transcription errors may be present.   Any transcriptional errors that result from this process are unintentional.     [name], M.D., F.A.C.S. Gastrointestinal and Minimally Invasive Surgery [facility] [address], [place], [place] [address] [phone] Main / Paging   04/17/2017

## 2017-04-18 LAB — BASIC METABOLIC PANEL
ANION GAP: 7 (ref 5–15)
BUN: 7 mg/dL (ref 6–20)
CALCIUM: 8.2 mg/dL — AB (ref 8.9–10.3)
CO2: 28 mmol/L (ref 22–32)
Chloride: 102 mmol/L (ref 101–111)
Creatinine, Ser: 0.35 mg/dL — ABNORMAL LOW (ref 0.44–1.00)
GFR calc non Af Amer: >60 mL/min (ref >60)
Glucose, Bld: 102 mg/dL — ABNORMAL HIGH (ref 65–99)
Potassium: 2.9 mmol/L — ABNORMAL LOW (ref 3.5–5.1)
SODIUM: 137 mmol/L (ref 135–145)

## 2017-04-18 LAB — GLUCOSE, CAPILLARY
GLUCOSE-CAPILLARY: 102 mg/dL — AB (ref 65–99)
GLUCOSE-CAPILLARY: 113 mg/dL — AB (ref 65–99)
GLUCOSE-CAPILLARY: 113 mg/dL — AB (ref 65–99)
GLUCOSE-CAPILLARY: 185 mg/dL — AB (ref 65–99)
Glucose-Capillary: 115 mg/dL — ABNORMAL HIGH (ref 65–99)
Glucose-Capillary: 135 mg/dL — ABNORMAL HIGH (ref 65–99)

## 2017-04-18 LAB — CBC
HCT: 34.6 % — ABNORMAL LOW (ref 36.0–46.0)
HEMOGLOBIN: 11.6 g/dL — AB (ref 12.0–15.0)
MCH: 26.7 pg (ref 26.0–34.0)
MCHC: 33.5 g/dL (ref 30.0–36.0)
MCV: 79.5 fL (ref 78.0–100.0)
PLATELETS: 275 10*3/uL (ref 150–400)
RBC: 4.35 MIL/uL (ref 3.87–5.11)
RDW: 15.5 % (ref 11.5–15.5)
WBC: 7.9 10*3/uL (ref 4.0–10.5)

## 2017-04-18 LAB — PHOSPHORUS: PHOSPHORUS: 1.9 mg/dL — AB (ref 2.5–4.6)

## 2017-04-18 LAB — MAGNESIUM: MAGNESIUM: 1.7 mg/dL (ref 1.7–2.4)

## 2017-04-18 MED ORDER — METOCLOPRAMIDE HCL 10 MG/10ML PO SOLN
5.0000 mg | Freq: Three times a day (TID) | ORAL | Status: DC
  Administered 2017-04-18 – 2017-04-20 (×5): 5 mg
  Filled 2017-04-18: qty 10
  Filled 2017-04-18: qty 5
  Filled 2017-04-18 (×2): qty 10
  Filled 2017-04-18 (×5): qty 5

## 2017-04-18 MED ORDER — K PHOS MONO-SOD PHOS DI & MONO 155-852-130 MG PO TABS
500.0000 mg | ORAL_TABLET | Freq: Two times a day (BID) | ORAL | Status: DC
  Administered 2017-04-18 (×2): 500 mg
  Filled 2017-04-18 (×3): qty 2

## 2017-04-18 MED ORDER — MAGNESIUM SULFATE 4 GM/100ML IV SOLN
4.0000 g | Freq: Once | INTRAVENOUS | Status: AC
  Administered 2017-04-18: 4 g via INTRAVENOUS
  Filled 2017-04-18: qty 100

## 2017-04-18 MED ORDER — POTASSIUM PHOSPHATES 15 MMOLE/5ML IV SOLN
40.0000 meq | Freq: Once | INTRAVENOUS | Status: AC
  Administered 2017-04-18: 40 meq via INTRAVENOUS
  Filled 2017-04-18: qty 9.09

## 2017-04-18 NOTE — Progress Notes (Signed)
Nutrition Follow-up  DOCUMENTATION CODES:   Non-severe (moderate) malnutrition in context of acute illness/injury  INTERVENTION:  Dietetic intern informed pt of diet advancement to Dys I.  Pt cancelled clear liquid breakfast that was already ordered and re-ordered breakfast.  RD to restart calorie count starting with breakfast today through all day 2/7.  D/C Unjury TID and D/C Boost Breeze.  RD to order Magic cup (orange) TID with meals, each supplement provides 290kcals and 9g of protein.  RD to order [employer] with whole milk. Each packet provides 130kcal and 5g of protein. Whole milk (8oz) provides 150kcal and 8g protein.  NUTRITION DIAGNOSIS:   Moderate Malnutrition related to acute illness(nissen fundoplication complications) as evidenced by percent weight loss, energy intake < or equal to 50% for > or equal to 1 month, mild fat depletion, mild muscle depletion. Ongoing.   GOAL:   Patient will meet greater than or equal to 90% of their needs Progressing.  MONITOR:   PO intake, Supplement acceptance, Diet advancement, Weight trends, Labs  ASSESSMENT:   Pt with PMH significant for large hiatal hernia s/p robotic repair with Nissen fundoplication 01/31/2017. Was hospitalized 12/8 for progressive dysphagia with regurgitation with anything PO. Directly admitted 1/29 for same symptoms.   Pt was not aware of diet advancement to dysphagia I this morning from clear liquid. Ambassador arrived with clear liquid breakfast and assisted pt in cancelling order and re-ordering dysphagia 1 breakfast.   Pt reports skipping breakfast yesterday, consuming only a popsicle at lunch, and 25% jello and 50% of mango sherbet for dinner last night. Pt reports consuming 25% of one Unjury supplement yesterday and did not consume Boost Breeze yesterday. Pt states she has very little appetite currently.   Pt amenable to Magic Cup (orange) TID with meals and trying [employer] with whole milk for breakfast. Intern also suggested pt could keep powder packet to add to yogurt at a later meal. Pt does not care for Unjury or [employer].   Dietetic intern spoke with RN about calorie count being in place since yesterday. RD to restart calorie count due to lack of diet order reports in folder on pt's door and recent diet advancement.   Diet Order:  DIET - DYS 1 Room service appropriate? Yes with Assist; Fluid consistency: Thin  EDUCATION NEEDS:   Education needs have been addressed  Skin:  Skin Assessment: Reviewed RN Assessment  Last BM:  2/519  Height:   Ht Readings from Last 1 Encounters:  04/12/17 4\' 10"  (1.473 m)    Weight:   Wt Readings from Last 1 Encounters:  04/16/17 139 lb 5.3 oz (63.2 kg)    Ideal Body Weight:  45.5 kg  BMI:  Body mass index is 29.12 kg/m.  Estimated Nutritional Needs:   Kcal:  1600-1800 kcal/day  Protein:  80-90 g/day  Fluid:  >1.6 L/day  Vercie Pokorny, MS, Dietetic Intern Pager # [phone]

## 2017-04-18 NOTE — Progress Notes (Signed)
Physical Therapy Treatment Patient Details Name: Sydney [name] MRN: [id number] DOB: 1946-02-10 Today's Date: 04/18/2017    History of Present Illness 72 yo female admitted 04/10/17 with inability to swallow, weight loss. 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    PT Comments    Patient is walking multiple times with increased distance per patient report. Continue PT.   Follow Up Recommendations  No PT follow up     Equipment Recommendations  None recommended by PT    Recommendations for Other Services       Precautions / Restrictions Precautions Precaution Comments: JP drain, PEG    Mobility  Bed Mobility               General bed mobility comments: oob  Transfers   Equipment used: Rolling walker (2 wheeled) Transfers: Sit to/from Stand Sit to Stand: Supervision            Ambulation/Gait Ambulation/Gait assistance: Min guard Ambulation Distance (Feet): 300 Feet Assistive device: Rolling walker (2 wheeled) Gait Pattern/deviations: Step-through pattern     General Gait Details: patient moves slowly and is steady.    Stairs            Wheelchair Mobility    Modified Rankin (Stroke Patients Only)       Balance                                            Cognition Arousal/Alertness: Awake/alert                                            Exercises      General Comments        Pertinent Vitals/Pain Pain Score: 3  Pain Location: abdomen Pain Descriptors / Indicators: Discomfort Pain Intervention(s): Monitored during session    Home Living                      Prior Function            PT Goals (current goals can now be found in the care plan section) Progress towards PT goals: Progressing toward goals    Frequency    Min 2X/week      PT Plan Current plan remains appropriate     Co-evaluation              AM-PAC PT "6 Clicks" Daily Activity  Outcome Measure  Difficulty turning over in bed (including adjusting bedclothes, sheets and blankets)?: None Difficulty moving from lying on back to sitting on the side of the bed? : None Difficulty sitting down on and standing up from a chair with arms (e.g., wheelchair, bedside commode, etc,.)?: A Little Help needed moving to and from a bed to chair (including a wheelchair)?: A Little Help needed walking in hospital room?: A Little Help needed climbing 3-5 steps with a railing? : A Little 6 Click Score: 20    End of Session   Activity Tolerance: Patient tolerated treatment well Patient left: in chair;with call bell/phone within reach Nurse Communication: Mobility status PT Visit Diagnosis: Unsteadiness on feet (R26.81)     Time: 8828-0034 PT Time Calculation (min) (ACUTE ONLY): 13 min  Charges:  $  Gait Training: 8-22 mins                    G Codes:       Blanchard Kelch PT [phone]   Rada Hay 04/18/2017, 10:18 AM

## 2017-04-18 NOTE — Progress Notes (Signed)
[place]  [address] [place], [place] [address] Phone: [phone]  FAX: [place] [phone] Feb 05, 1946  CARE TEAM:  PCP: Rory Percy, MD  Outpatient Care Team: Patient Care Team: Rory Percy, MD as PCP - General (Family Medicine) Michael Boston, MD as Consulting Physician (General Surgery) Danis, Kirke Corin, MD as Consulting Physician (Gastroenterology)  Inpatient Treatment Team: Treatment Team: Attending Provider: Michael Boston, MD; Registered Nurse: Heloise Ochoa, RN; Technician: Etheleen Sia, NT; Consulting Physician: Alphonsa Overall, MD; Consulting Physician: Edison Pace, Md, MD; Technician: Leda Quail, NT; Registered Nurse: Vicente Serene, RN; Registered Nurse: Mortimer Fries, RN   Problem List:   Principal Problem:   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 Type II mediastinal dissection. 3. Primary repair of hiatal hernia over pledgets. 3.5 Mesh reinforcement (Phasix) of hiatal hernia repair 4. Anterior & posterior gastropexy. 5. Nissen fundoplication 2 cm over a 56-French bougie  SURGEON: [name], MD   5 Days Post-Op  04/13/2017  POST-OPERATIVE DIAGNOSIS:    Esophageal stricture at Nissen fundoplication. Erosion of suture and pledgets  into the esophagogastric junction. 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    PROCEDURE:    Diagnostic laparoscopy with lysis of adhesions. Trans-gastric intraluminal laparoscopic exploration. Upper endoscopy. Endoscopic excision of suture and pledget foreign body from esophagogastric junction. Balloon dilation of esophageal distal esophageal stricture. Steroid injection of stricture with Kenalog-40 Endoscopic stenting across esophageal stricture. Laparoscopically assisted gastrostomy tube placement  SURGEON:  [name], MD        Assessment  Failure to thrive with esophageal stricture and lateral esophagogastric fistula - controlled  Plan:  Esophagram showed no leak.  Tolerating clears.  Advance to pured as tolerated.  Switch G-tube feeds to bolus feeds.  Make sure she gets 100% nutrition.  Calorie counts.  Nutrition just wants to try one boost a day.  Bowel regimen.  Somewhat loose.  Add fiber.  Send drain fluid for amylase.  Apparently was not done.  I reordered.  If no evidence of elevated amylase, remove the drain.  No obvious leak on esophagram but would like to double check.  IV antibiotics to help minimize infection progression x 3d postop.  Stop & follow  IV steroids x 3d postop  Hypokalemia.  Correcting.    Hypomagnesemia.  Replace.  Hypophosphatemia.  Most likely with refeeding.  Replacing.  Follow.    -HTN control -GERD control -VTE prophylaxis- SCDs, etc -mobilize as tolerated to help recovery  20 minutes spent in review, evaluation, examination, counseling, and coordination of care.  More than 50% of that time was spent in counseling.  [name], M.D., F.A.C.S. Gastrointestinal and Minimally Invasive Surgery [facility] [address], [place], [place] [address] [phone] Main / Paging   04/18/2017    Subjective: (Chief complaint)  Feeling  better.  Tolerating liquids.  Just tried some pureed food  Husband in room.  Objective:  Vital signs:  Vitals:   04/17/17 1155 04/17/17 1513 04/17/17 2138 04/18/17 0530  BP: 120/70 126/64 (!) 141/72 (!) 141/68  Pulse: (!) 104 94 92 94  Resp: 16 16 16 16   Temp: 97.6 F (36.4 C) 98 F (36.7 C) 97.8 F (36.6 C) 98.6 F (37 C)  TempSrc: Oral Oral Oral Oral  SpO2: 100% 99% 99% 100%  Weight:      Height:        Last BM Date: 04/17/17  Intake/Output   Yesterday:  02/05 0701 - 02/06 0700 In: 1400 [P.O.:1200; IV Piggyback:200] Out: 1333 [Urine:1300; Drains:33] This shift:  Total I/O In: 9201 [P.O.:840; IV Piggyback:200] Out: 1220 [Urine:1200; Drains:20]  Bowel function:  Flatus: YES  BM:  YES  Drain: Serosanguinous   Physical Exam:  General: Pt awake/alert/oriented x4 in no acute distress.  Sad and mildly anxious but consolable.  Not sickly.  Not toxic.   Eyes: PERRL, normal EOM.  Sclera clear.  No icterus Neuro: CN II-XII intact w/o focal sensory/motor deficits. Lymph: No head/neck/groin lymphadenopathy Psych:  No delerium/psychosis/paranoia HENT: Normocephalic, Mucus membranes moist.  No thrush Neck: Supple, No tracheal deviation Chest: No chest wall pain w good excursion.  No wheezing.  Lungs clear .  no conversational dyspnea. CV:  Pulses intact.  Regular rhythm MS: Normal AROM mjr joints.  No obvious deformity  Abdomen: Soft.  Nondistended.  Mildly tender at incisions only.  Left-sided gastrostomy tube in place without leak.  Tube feeds at 20 mL an hour - not advanced.  No evidence of peritonitis.  No incarcerated hernias.  Ext:  No deformity.  No mjr edema.  No cyanosis Skin: No petechiae / purpura  Results:   Labs: Results for orders placed or performed during the hospital encounter of 04/10/17 (from the past 48 hour(s))  Glucose, capillary     Status: Abnormal   Collection Time: 04/16/17  7:40 AM  Result Value Ref Range   Glucose-Capillary 151  (H) 65 - 99 mg/dL  Glucose, capillary     Status: Abnormal   Collection Time: 04/16/17 11:58 AM  Result Value Ref Range   Glucose-Capillary 126 (H) 65 - 99 mg/dL  Glucose, capillary     Status: Abnormal   Collection Time: 04/16/17  4:21 PM  Result Value Ref Range   Glucose-Capillary 121 (H) 65 - 99 mg/dL  Glucose, capillary     Status: Abnormal   Collection Time: 04/16/17  8:35 PM  Result Value Ref Range   Glucose-Capillary 114 (H) 65 - 99 mg/dL  Glucose, capillary     Status: Abnormal   Collection Time: 04/17/17 12:29 AM  Result Value Ref Range   Glucose-Capillary 115 (H) 65 - 99 mg/dL  Glucose, capillary     Status: Abnormal   Collection Time: 04/17/17  4:32 AM  Result Value Ref Range   Glucose-Capillary 112 (H) 65 - 99 mg/dL  Basic metabolic panel     Status: Abnormal   Collection Time: 04/17/17  5:15 AM  Result Value Ref Range   Sodium 138 135 - 145 mmol/L   Potassium 3.3 (L) 3.5 - 5.1 mmol/L   Chloride 103 101 - 111 mmol/L   CO2 27 22 - 32 mmol/L   Glucose, Bld 125 (H) 65 - 99 mg/dL   BUN 7 6 - 20 mg/dL   Creatinine, Ser 0.41 (L) 0.44 - 1.00 mg/dL   Calcium 8.4 (L)  8.9 - 10.3 mg/dL   GFR calc non Af Amer >60 >60 mL/min   GFR calc Af Amer >60 >60 mL/min    Comment: (NOTE) The eGFR has been calculated using the CKD EPI equation. This calculation has not been validated in all clinical situations. eGFR's persistently <60 mL/min signify possible Chronic Kidney Disease.    Anion gap 8 5 - 15    Comment: Performed at [facility], [place] [address] [place], [place] [address]  CBC     Status: Abnormal   Collection Time: 04/17/17  5:15 AM  Result Value Ref Range   WBC 8.1 4.0 - 10.5 K/uL   RBC 4.47 3.87 - 5.11 MIL/uL   Hemoglobin 11.8 (L) 12.0 - 15.0 g/dL   HCT 36.1 36.0 - 46.0 %   MCV 80.8 78.0 - 100.0 fL   MCH 26.4 26.0 - 34.0 pg   MCHC 32.7 30.0 - 36.0 g/dL   RDW 15.3 11.5 - 15.5 %   Platelets 293 150 - 400 K/uL    Comment: Performed at [facility], [place] [address], [place], [place] [address]  Amylase     Status: None   Collection Time: 04/17/17  5:15 AM  Result Value Ref Range   Amylase 41 28 - 100 U/L    Comment: Performed at [facility], [place] [address] [place], [place] [address]  Magnesium     Status: Abnormal   Collection Time: 04/17/17  5:15 AM  Result Value Ref Range   Magnesium 1.4 (L) 1.7 - 2.4 mg/dL    Comment: Performed at [facility], [place] [address] [place], [place] [address]  Phosphorus     Status: Abnormal   Collection Time: 04/17/17  5:15 AM  Result Value Ref Range   Phosphorus 1.1 (L) 2.5 - 4.6 mg/dL    Comment: Performed at [facility], [place] [address], [place], [place] [address]  Glucose, capillary     Status: Abnormal   Collection Time: 04/17/17  9:22 AM  Result Value Ref Range   Glucose-Capillary 113 (H) 65 - 99 mg/dL  Glucose, capillary     Status: Abnormal   Collection Time: 04/17/17 12:22 PM  Result Value Ref Range   Glucose-Capillary 111 (H) 65 - 99 mg/dL  Glucose, capillary     Status: None   Collection Time: 04/17/17  5:08 PM  Result Value Ref Range   Glucose-Capillary 98 65 - 99 mg/dL  Glucose, capillary     Status: Abnormal   Collection Time: 04/17/17  8:11 PM  Result Value Ref Range   Glucose-Capillary 108 (H) 65 - 99 mg/dL  Glucose, capillary     Status: Abnormal   Collection Time: 04/17/17 11:51 PM  Result Value Ref Range   Glucose-Capillary 120 (H) 65 - 99 mg/dL  Glucose, capillary     Status: Abnormal   Collection Time: 04/18/17  3:24 AM  Result Value Ref Range   Glucose-Capillary 102 (H) 65 - 99 mg/dL  Basic metabolic panel     Status: Abnormal   Collection Time: 04/18/17  5:14 AM  Result Value Ref Range   Sodium 137 135 - 145 mmol/L   Potassium 2.9 (L) 3.5 - 5.1 mmol/L   Chloride 102 101 - 111 mmol/L   CO2 28 22 - 32 mmol/L   Glucose, Bld 102 (H) 65 - 99 mg/dL   BUN 7 6 - 20 mg/dL   Creatinine,  Ser 0.35 (L) 0.44 -  1.00 mg/dL   Calcium 8.2 (L) 8.9 - 10.3 mg/dL   GFR calc non Af Amer >60 >60 mL/min   GFR calc Af Amer >60 >60 mL/min    Comment: (NOTE) The eGFR has been calculated using the CKD EPI equation. This calculation has not been validated in all clinical situations. eGFR's persistently <60 mL/min signify possible Chronic Kidney Disease.    Anion gap 7 5 - 15    Comment: Performed at [facility], [place] [address] [place], [place] [address]  CBC     Status: Abnormal   Collection Time: 04/18/17  5:14 AM  Result Value Ref Range   WBC 7.9 4.0 - 10.5 K/uL   RBC 4.35 3.87 - 5.11 MIL/uL   Hemoglobin 11.6 (L) 12.0 - 15.0 g/dL   HCT 34.6 (L) 36.0 - 46.0 %   MCV 79.5 78.0 - 100.0 fL   MCH 26.7 26.0 - 34.0 pg   MCHC 33.5 30.0 - 36.0 g/dL   RDW 15.5 11.5 - 15.5 %   Platelets 275 150 - 400 K/uL    Comment: Performed at [facility], [place] [address], [place], [place] [address]  Phosphorus     Status: Abnormal   Collection Time: 04/18/17  5:14 AM  Result Value Ref Range   Phosphorus 1.9 (L) 2.5 - 4.6 mg/dL    Comment: Performed at [facility], [place] [address], [place], [place] [address]    Imaging / Studies: Dg Esophagus W/water Sol Cm  Result Date: 04/16/2017 CLINICAL DATA:  Postop Nissen fundoplication repair and esophageal stenting EXAM: ESOPHOGRAM/BARIUM SWALLOW TECHNIQUE: Single contrast examination was performed using  75 mL Isovue 300. FLUOROSCOPY TIME:  Fluoroscopy Time:  1.3 min Radiation Exposure Index (if provided by the fluoroscopic device): 26 mGy Number of Acquired Spot Images: 0 COMPARISON:  None. FINDINGS: Fluoroscopic evaluation of swallowing demonstrates widely patent distal esophageal stent. No evidence of leak or extravasation. Slow emptying of the stomach, likely related to postoperative gastroparesis. IMPRESSION: Distal esophageal stent widely patent.  No evidence of leak. Electronically Signed   By: Rolm Baptise M.D.   On: 04/16/2017 10:45    Medications / Allergies: per chart  Antibiotics: Anti-infectives (From admission, onward)   Start     Dose/Rate Route Frequency Ordered Stop   04/13/17 0900  cefTRIAXone (ROCEPHIN) 2 g in dextrose 5 % 50 mL IVPB     2 g 100 mL/hr over 30 Minutes Intravenous On call to O.R. 04/13/17 0856 04/13/17 1449   04/13/17 0900  metroNIDAZOLE (FLAGYL) IVPB 500 mg  Status:  Discontinued     500 mg 100 mL/hr over 60 Minutes Intravenous Every 8 hours 04/13/17 0856 04/18/17 0656   04/11/17 1800  piperacillin-tazobactam (ZOSYN) IVPB 3.375 g     3.375 g 12.5 mL/hr over 240 Minutes Intravenous Every 8 hours 04/11/17 1750 04/15/17 0959        Note: Portions of this report may have been transcribed using voice recognition software. Every effort was made to ensure accuracy; however, inadvertent computerized transcription errors may be present.   Any transcriptional errors that result from this process are unintentional.     [name], M.D., F.A.C.S. Gastrointestinal and Minimally Invasive Surgery [facility] [address] [place], [place] [address] [phone] Main / Paging   04/18/2017

## 2017-04-18 NOTE — Progress Notes (Signed)
OT Cancellation Note  Patient Details Name: Sydney [name] MRN: [id number] DOB: Jul 14, 1945   Cancelled Treatment:    Reason Eval/Treat Not Completed: Other (comment). Pt just had tube area cleaned and is sore. She has been walking a lot and feels comfortable with bed mobility/ambulation.  Pt has a shower seat; demonstrated side stepping into tub to access this. Will check back to practice this.  Sydney [name] 04/18/2017, 3:28 PM  Marica Otter, OTR/L [phone] 04/18/2017

## 2017-04-19 LAB — GLUCOSE, CAPILLARY
GLUCOSE-CAPILLARY: 107 mg/dL — AB (ref 65–99)
Glucose-Capillary: 110 mg/dL — ABNORMAL HIGH (ref 65–99)
Glucose-Capillary: 111 mg/dL — ABNORMAL HIGH (ref 65–99)
Glucose-Capillary: 121 mg/dL — ABNORMAL HIGH (ref 65–99)
Glucose-Capillary: 125 mg/dL — ABNORMAL HIGH (ref 65–99)
Glucose-Capillary: 167 mg/dL — ABNORMAL HIGH (ref 65–99)

## 2017-04-19 LAB — CBC
HCT: 33.1 % — ABNORMAL LOW (ref 36.0–46.0)
Hemoglobin: 11.3 g/dL — ABNORMAL LOW (ref 12.0–15.0)
MCH: 26.5 pg (ref 26.0–34.0)
MCHC: 34.1 g/dL (ref 30.0–36.0)
MCV: 77.7 fL — AB (ref 78.0–100.0)
PLATELETS: 275 10*3/uL (ref 150–400)
RBC: 4.26 MIL/uL (ref 3.87–5.11)
RDW: 15.7 % — AB (ref 11.5–15.5)
WBC: 9.8 10*3/uL (ref 4.0–10.5)

## 2017-04-19 LAB — BASIC METABOLIC PANEL
Anion gap: 6 (ref 5–15)
BUN: 5 mg/dL — AB (ref 6–20)
CHLORIDE: 99 mmol/L — AB (ref 101–111)
CO2: 31 mmol/L (ref 22–32)
CREATININE: 0.37 mg/dL — AB (ref 0.44–1.00)
Calcium: 7.8 mg/dL — ABNORMAL LOW (ref 8.9–10.3)
GFR calc Af Amer: >60 mL/min (ref >60)
GFR calc non Af Amer: >60 mL/min (ref >60)
Glucose, Bld: 110 mg/dL — ABNORMAL HIGH (ref 65–99)
Potassium: 2.8 mmol/L — ABNORMAL LOW (ref 3.5–5.1)
Sodium: 136 mmol/L (ref 135–145)

## 2017-04-19 LAB — PHOSPHORUS: Phosphorus: 3.8 mg/dL (ref 2.5–4.6)

## 2017-04-19 LAB — MAGNESIUM: Magnesium: 2.3 mg/dL (ref 1.7–2.4)

## 2017-04-19 MED ORDER — POTASSIUM CHLORIDE 20 MEQ/15ML (10%) PO SOLN
40.0000 meq | Freq: Two times a day (BID) | ORAL | Status: DC
  Administered 2017-04-19 – 2017-04-20 (×3): 40 meq
  Filled 2017-04-19 (×3): qty 30

## 2017-04-19 MED ORDER — POTASSIUM CHLORIDE 10 MEQ/100ML IV SOLN
10.0000 meq | INTRAVENOUS | Status: AC
  Administered 2017-04-19 (×4): 10 meq via INTRAVENOUS
  Filled 2017-04-19 (×4): qty 100

## 2017-04-19 MED ORDER — OSMOLITE 1.5 CAL PO LIQD
237.0000 mL | Freq: Two times a day (BID) | ORAL | Status: DC
  Administered 2017-04-19 (×2): 237 mL
  Filled 2017-04-19 (×3): qty 237

## 2017-04-19 NOTE — Progress Notes (Signed)
Nutrition Follow-up  DOCUMENTATION CODES:   Non-severe (moderate) malnutrition in context of acute illness/injury  INTERVENTION:   -Initiate bolus feeds of 237 ml of Osmolite 1.5 BID.  -Recommend free water flushes of 30 ml before and after each bolus. -Provides 710 kcal (44% of needs), 29g protein and 482 ml H2O.  -Continue Magic cups TID with meals, each supplement provides 290 kcal and 9 grams of protein  -Continue [employer] Instant Breakfast once in the AM, each provides 280 kcal and 13g protein.  NUTRITION DIAGNOSIS:   Moderate Malnutrition related to acute illness(nissen fundoplication complications) as evidenced by percent weight loss, energy intake < or equal to 50% for > or equal to 1 month, mild fat depletion, mild muscle depletion.  Ongoing.  GOAL:   Patient will meet greater than or equal to 90% of their needs  Progressing.  MONITOR:   PO intake, Supplement acceptance, Diet advancement, Weight trends, Labs  REASON FOR ASSESSMENT:   Consult(verbal per Dr. Michaell Cowing) Enteral/tube feeding initiation and management  ASSESSMENT:   Pt with PMH significant for large hiatal hernia s/p robotic repair with Nissen fundoplication 01/31/2017. Was hospitalized 12/8 for progressive dysphagia with regurgitation with anything PO. Directly admitted 1/29 for same symptoms.   Events: 01/31/17- s/probotic paraesophageal incarcerated hiatal hernia repair and fundoplication. 04/11/17- Pt reports not tolerating anything x 1 week PTA. Intake poor since November. Diagnosed with moderate malnutrition. 04/13/17- s/pLaproscopic lysis of Adhesions, EGD with balloon dilation, G- placement, excision of foreign body esophagus  Spoke with Dr. Michaell Cowing, states his goal is to hopefully discharge patient tomorrow. Would like bolus feeds to be initiated,  Providing ~50% of needs. MD feels pt is not consuming enough PO.  Per Calorie Count (see separate note), pt eating but not meeting needs. Will  initiate bolus feeds of Osmolite 1.5 today to ensure tolerance. Will encourage pt to continue supplements as well.   Medications: Reglan solution TID, KCl solution BID, IV KCl  Labs reviewed: Low K Phos/Mg WNL -repleted per MD  Diet Order:  DIET - DYS 1 Room service appropriate? Yes with Assist; Fluid consistency: Thin  EDUCATION NEEDS:   Education needs have been addressed  Skin:  Skin Assessment: Reviewed RN Assessment  Last BM:  2/519  Height:   Ht Readings from Last 1 Encounters:  04/12/17 4\' 10"  (1.473 m)    Weight:   Wt Readings from Last 1 Encounters:  04/19/17 143 lb 8.3 oz (65.1 kg)    Ideal Body Weight:  45.5 kg  BMI:  Body mass index is 30 kg/m.  Estimated Nutritional Needs:   Kcal:  1600-1800 kcal/day  Protein:  80-90 g/day  Fluid:  >1.6 L/day  06/17/17, MS, RD, LDN Tilda Franco Inpatient Clinical Dietitian Pager: [phone] After Hours Pager: [phone]

## 2017-04-19 NOTE — Progress Notes (Signed)
[facility]  [name] [name], [place] [address] Phone: [phone]  FAX: [phone]      Sydney [name] [id number] 08/24/1945  CARE TEAM:  PCP: Rory Percy, MD  Outpatient Care Team: Patient Care Team: Rory Percy, MD as PCP - General (Family Medicine) Michael Boston, MD as Consulting Physician (General Surgery) Danis, Kirke Corin, MD as Consulting Physician (Gastroenterology)  Inpatient Treatment Team: Treatment Team: Attending Provider: Michael Boston, MD; Registered Nurse: Heloise Ochoa, RN; Technician: Etheleen Sia, NT; Consulting Physician: Alphonsa Overall, MD; Consulting Physician: Edison Pace, Md, MD; Technician: Leda Quail, NT; Technician: Resa Miner, NT   Problem List:   Principal Problem:   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 Type II mediastinal dissection. 3. Primary repair of hiatal hernia over pledgets. 3.5 Mesh reinforcement (Phasix) of hiatal hernia repair 4. Anterior & posterior gastropexy. 5. Nissen fundoplication 2 cm over a 56-French bougie  SURGEON: [name], MD   6 Days Post-Op  04/13/2017  POST-OPERATIVE DIAGNOSIS:    Esophageal stricture at Nissen fundoplication. Erosion of suture and pledgets into the esophagogastric junction. Right posterior  esophagogastric fistula to right posterior fundoplication wrap.    PROCEDURE:    Diagnostic laparoscopy with lysis of adhesions. Trans-gastric intraluminal laparoscopic exploration. Upper endoscopy. Endoscopic excision of suture and pledget foreign body from esophagogastric junction. Balloon dilation of esophageal distal esophageal stricture. Steroid injection of stricture with Kenalog-40 Endoscopic stenting across esophageal stricture. Laparoscopically assisted gastrostomy tube placement  SURGEON:  [name], MD        Assessment  Failure to thrive with esophageal stricture and lateral esophagogastric fistula - controlled  Plan:  [place] showed no leak.  Tolerating clears.  Advance to pured as tolerated.  Switch G-tube feeds to bolus feeds.  Make sure she gets 100% nutrition.  Calorie counts.  Nutrition just wants to try one boost a day.  I think need to increase to BID.  D/w RD/Nutirtion  Bowel regimen.  Somewhat loose.  Add fiber.  Send drain fluid for amylase.  Apparently was not done.  AGAIN.  I reordered AGAIN.  Nevin Bloodgood RN getting now & ordering.  If no evidence of elevated amylase, remove the drain.  No obvious leak on esophagram but would like to double check.  IV antibiotics to help minimize infection progression x 3d postop.  Stop & follow  IV steroids x 3d postop  Hypokalemia.  Correcting. If better, probably can d/c home tomorrow   Hypomagnesemia.  Replace.  Hypophosphatemia.  Most likely with refeeding.  Replacing.  Follow.    -HTN control -GERD control -VTE prophylaxis- SCDs, etc -mobilize as tolerated to help recovery  20 minutes spent in review, evaluation, examination, counseling, and coordination of care.  More than 50% of that time was spent in counseling.  [name], M.D., F.A.C.S. Gastrointestinal and Minimally Invasive Surgery [facility] [address]  [address] [place], [place]  [address] [phone] Main / Paging   04/19/2017    Subjective: (Chief complaint)  Feeling sore.  Not asking for meds.  Tolerating Dys1 diet but poor appetite Husband in room.  Objective:  Vital signs:  Vitals:   04/18/17 1400 04/18/17 2229 04/19/17 0445 04/19/17 0500  BP: 127/70 128/68 122/63   Pulse: 98 92 92   Resp: 18 16 16    Temp: 98.5 F (36.9 C) 98.5 F (36.9 C) 98 F (36.7 C)   TempSrc: Oral Oral Oral   SpO2: 98% 97% 95%   Weight:    65.1 kg (143 lb 8.3 oz)  Height:        Last BM Date: 04/18/17  Intake/Output   Yesterday:  02/06 0701 - 02/07 0700 In: 480 [P.O.:480] Out: 1145 [Urine:1100; Drains:45] This shift:  No intake/output data recorded.  Bowel function:  Flatus: YES  BM:  YES  Drain: Serosanguinous   Physical Exam:  General: Pt awake/alert/oriented x4 in no acute distress.  Sad and mildly anxious but consolable.  Not sickly.  Not toxic.   Eyes: PERRL, normal EOM.  Sclera clear.  No icterus Neuro: CN II-XII intact w/o focal sensory/motor deficits. Lymph: No head/neck/groin lymphadenopathy Psych:  No delerium/psychosis/paranoia HENT: Normocephalic, Mucus membranes moist.  No thrush Neck: Supple, No tracheal deviation Chest: No chest wall pain w good excursion.  No wheezing.  Lungs clear .  no conversational dyspnea. CV:  Pulses intact.  Regular rhythm MS: Normal AROM mjr joints.  No obvious deformity  Abdomen: Soft.  Nondistended.  Mildly tender at incisions only.  Left-sided gastrostomy tube in place without leak.  Tube feeds at 20 mL an hour - not advanced.  No evidence of peritonitis.  No incarcerated hernias.  Ext:  No deformity.  No mjr edema.  No cyanosis Skin: No petechiae / purpura  Results:   Labs: Results for orders placed or performed during the hospital encounter of 04/10/17 (from the past 48 hour(s))  Glucose, capillary     Status: Abnormal   Collection Time: 04/17/17 12:22 PM  Result Value Ref Range    Glucose-Capillary 111 (H) 65 - 99 mg/dL  Glucose, capillary     Status: None   Collection Time: 04/17/17  5:08 PM  Result Value Ref Range   Glucose-Capillary 98 65 - 99 mg/dL  Glucose, capillary     Status: Abnormal   Collection Time: 04/17/17  8:11 PM  Result Value Ref Range   Glucose-Capillary 108 (H) 65 - 99 mg/dL  Glucose, capillary     Status: Abnormal   Collection Time: 04/17/17 11:51 PM  Result Value Ref Range   Glucose-Capillary 120 (H) 65 - 99 mg/dL  Glucose, capillary     Status: Abnormal   Collection Time: 04/18/17  3:24 AM  Result Value Ref Range   Glucose-Capillary 102 (H) 65 - 99 mg/dL  Basic metabolic panel     Status: Abnormal   Collection Time: 04/18/17  5:14 AM  Result Value Ref Range   Sodium 137 135 - 145 mmol/L   Potassium 2.9 (L) 3.5 - 5.1 mmol/L   Chloride 102 101 - 111 mmol/L   CO2 28 22 - 32 mmol/L   Glucose, Bld 102 (H) 65 - 99 mg/dL   BUN 7 6 - 20 mg/dL   Creatinine, Ser 0.35 (L) 0.44 - 1.00 mg/dL   Calcium 8.2 (L) 8.9 - 10.3 mg/dL   GFR calc non Af Amer >60 >60 mL/min  GFR calc Af Amer >60 >60 mL/min    Comment: (NOTE) The eGFR has been calculated using the CKD EPI equation. This calculation has not been validated in all clinical situations. eGFR's persistently <60 mL/min signify possible Chronic Kidney Disease.    Anion gap 7 5 - 15    Comment: Performed at [facility], [place] [address] [place], [place] [address]  CBC     Status: Abnormal   Collection Time: 04/18/17  5:14 AM  Result Value Ref Range   WBC 7.9 4.0 - 10.5 K/uL   RBC 4.35 3.87 - 5.11 MIL/uL   Hemoglobin 11.6 (L) 12.0 - 15.0 g/dL   HCT 34.6 (L) 36.0 - 46.0 %   MCV 79.5 78.0 - 100.0 fL   MCH 26.7 26.0 - 34.0 pg   MCHC 33.5 30.0 - 36.0 g/dL   RDW 15.5 11.5 - 15.5 %   Platelets 275 150 - 400 K/uL    Comment: Performed at [facility], [place] [address] [place], [place] [address]  Phosphorus     Status: Abnormal   Collection Time: 04/18/17   5:14 AM  Result Value Ref Range   Phosphorus 1.9 (L) 2.5 - 4.6 mg/dL    Comment: Performed at [facility], [place] [address], [place], [place] [address]  Magnesium     Status: None   Collection Time: 04/18/17  5:14 AM  Result Value Ref Range   Magnesium 1.7 1.7 - 2.4 mg/dL    Comment: Performed at [facility], [place] [address], [place],  [address]  Glucose, capillary     Status: Abnormal   Collection Time: 04/18/17  7:44 AM  Result Value Ref Range   Glucose-Capillary 113 (H) 65 - 99 mg/dL  Glucose, capillary     Status: Abnormal   Collection Time: 04/18/17 12:26 PM  Result Value Ref Range   Glucose-Capillary 115 (H) 65 - 99 mg/dL  Glucose, capillary     Status: Abnormal   Collection Time: 04/18/17  4:07 PM  Result Value Ref Range   Glucose-Capillary 135 (H) 65 - 99 mg/dL  Glucose, capillary     Status: Abnormal   Collection Time: 04/18/17  7:48 PM  Result Value Ref Range   Glucose-Capillary 185 (H) 65 - 99 mg/dL  Glucose, capillary     Status: Abnormal   Collection Time: 04/18/17 11:59 PM  Result Value Ref Range   Glucose-Capillary 113 (H) 65 - 99 mg/dL  Glucose, capillary     Status: Abnormal   Collection Time: 04/19/17  3:34 AM  Result Value Ref Range   Glucose-Capillary 121 (H) 65 - 99 mg/dL  Basic metabolic panel     Status: Abnormal   Collection Time: 04/19/17  6:01 AM  Result Value Ref Range   Sodium 136 135 - 145 mmol/L   Potassium 2.8 (L) 3.5 - 5.1 mmol/L   Chloride 99 (L) 101 - 111 mmol/L   CO2 31 22 - 32 mmol/L   Glucose, Bld 110 (H) 65 - 99 mg/dL   BUN 5 (L) 6 - 20 mg/dL   Creatinine, Ser 0.37 (L) 0.44 - 1.00 mg/dL   Calcium 7.8 (L) 8.9 - 10.3 mg/dL   GFR calc non Af Amer >60 >60 mL/min   GFR calc Af Amer >60 >60 mL/min    Comment: (NOTE) The eGFR has been calculated using the CKD EPI equation. This calculation has not been validated in all clinical situations. eGFR's persistently <60 mL/min  signify possible Chronic  Kidney Disease.    Anion gap 6 5 - 15    Comment: Performed at [facility], [place] [address], [place], [place] [address]  CBC     Status: Abnormal   Collection Time: 04/19/17  6:01 AM  Result Value Ref Range   WBC 9.8 4.0 - 10.5 K/uL   RBC 4.26 3.87 - 5.11 MIL/uL   Hemoglobin 11.3 (L) 12.0 - 15.0 g/dL   HCT 33.1 (L) 36.0 - 46.0 %   MCV 77.7 (L) 78.0 - 100.0 fL   MCH 26.5 26.0 - 34.0 pg   MCHC 34.1 30.0 - 36.0 g/dL   RDW 15.7 (H) 11.5 - 15.5 %   Platelets 275 150 - 400 K/uL    Comment: Performed at [facility], [place] [address], [place], [place] [address]  Phosphorus     Status: None   Collection Time: 04/19/17  6:01 AM  Result Value Ref Range   Phosphorus 3.8 2.5 - 4.6 mg/dL    Comment: Performed at [facility], [place] [address], [place], [place] [address]  Magnesium     Status: None   Collection Time: 04/19/17  6:01 AM  Result Value Ref Range   Magnesium 2.3 1.7 - 2.4 mg/dL    Comment: Performed at [facility], [place] [address] [place], [place] [address]  Glucose, capillary     Status: Abnormal   Collection Time: 04/19/17  7:51 AM  Result Value Ref Range   Glucose-Capillary 107 (H) 65 - 99 mg/dL    Imaging / Studies: No results found.  Medications / Allergies: per chart  Antibiotics: Anti-infectives (From admission, onward)   Start     Dose/Rate Route Frequency Ordered Stop   04/13/17 0900  cefTRIAXone (ROCEPHIN) 2 g in dextrose 5 % 50 mL IVPB     2 g 100 mL/hr over 30 Minutes Intravenous On call to O.R. 04/13/17 0856 04/13/17 1449   04/13/17 0900  metroNIDAZOLE (FLAGYL) IVPB 500 mg  Status:  Discontinued     500 mg 100 mL/hr over 60 Minutes Intravenous Every 8 hours 04/13/17 0856 04/18/17 0656   04/11/17 1800  piperacillin-tazobactam (ZOSYN) IVPB 3.375 g     3.375 g 12.5 mL/hr over 240 Minutes Intravenous Every 8 hours 04/11/17 1750 04/15/17 0959        Note: Portions of this report may  have been transcribed using voice recognition software. Every effort was made to ensure accuracy; however, inadvertent computerized transcription errors may be present.   Any transcriptional errors that result from this process are unintentional.     [name], M.D., F.A.C.S. Gastrointestinal and Minimally Invasive Surgery [facility] [address], [place],  [address] [phone] Main / Paging   04/19/2017

## 2017-04-19 NOTE — Progress Notes (Signed)
Calorie Count Note  48 hour calorie count ordered. Day 1 results below.  Diet: Dysphagia 1  Supplements:  -Magic Cups TID -[employer] Breakfast daily  2/6: Breakfast: no recorded intake for this meal Lunch: 430 kcal, 21g protein Dinner: 170 kcal, 7g protein Supplements: 580 kcal, 18g protein  Estimated Nutritional Needs:  Kcal:  1600-1800 kcal/day Protein:  80-90 g/day  Total intake: 1180 kcal (73% of minimum estimated needs)  46g  protein (57% of minimum estimated needs)  Nutrition Dx: Moderate Malnutrition related to acute illness(nissen fundoplication complications) as evidenced by percent weight loss, energy intake < or equal to 50% for > or equal to 1 month, mild fat depletion, mild muscle depletion.  Goal: Pt to meet >/= 90% of their estimated nutrition needs   Intervention:  -Continue Calorie Count -Initiate bolus feeds of Osmolite 1.5 BID -Continue Magic Cups TID with meals -Continue Carnation Instant Breakfast daily  Tilda Franco, MS, RD, LDN [facility] Long Inpatient Clinical Dietitian Pager: [phone] After Hours Pager: [phone]

## 2017-04-20 LAB — BASIC METABOLIC PANEL
ANION GAP: 6 (ref 5–15)
BUN: 6 mg/dL (ref 6–20)
CALCIUM: 8.2 mg/dL — AB (ref 8.9–10.3)
CHLORIDE: 103 mmol/L (ref 101–111)
CO2: 26 mmol/L (ref 22–32)
CREATININE: 0.34 mg/dL — AB (ref 0.44–1.00)
GFR calc Af Amer: >60 mL/min (ref >60)
GFR calc non Af Amer: >60 mL/min (ref >60)
GLUCOSE: 119 mg/dL — AB (ref 65–99)
Potassium: 4.5 mmol/L (ref 3.5–5.1)
Sodium: 135 mmol/L (ref 135–145)

## 2017-04-20 LAB — GLUCOSE, CAPILLARY
Glucose-Capillary: 118 mg/dL — ABNORMAL HIGH (ref 65–99)
Glucose-Capillary: 119 mg/dL — ABNORMAL HIGH (ref 65–99)

## 2017-04-20 LAB — AMYLASE, BODY FLUID (OTHER): Amylase, Body Fluid: 14 U/L

## 2017-04-20 LAB — PHOSPHORUS: PHOSPHORUS: 1.9 mg/dL — AB (ref 2.5–4.6)

## 2017-04-20 LAB — MAGNESIUM: MAGNESIUM: 2 mg/dL (ref 1.7–2.4)

## 2017-04-20 MED ORDER — OXYCODONE HCL 5 MG/5ML PO SOLN: 5.0000 mg | ORAL | 0 refills | Status: DC | PRN

## 2017-04-20 MED ORDER — PANTOPRAZOLE SODIUM 40 MG PO PACK: 40.0000 mg | PACK | Freq: Every day | ORAL | 3 refills | Status: DC

## 2017-04-20 MED ORDER — OSMOLITE 1.5 CAL PO LIQD: 237.0000 mL | Freq: Two times a day (BID) | ORAL | 3 refills | Status: DC

## 2017-04-20 MED ORDER — RANITIDINE HCL 300 MG PO TABS: 150.0000 mg | ORAL_TABLET | Freq: Every day | ORAL | 2 refills | Status: DC

## 2017-04-20 NOTE — Progress Notes (Signed)
Nutrition Follow-up  DOCUMENTATION CODES:   Non-severe (moderate) malnutrition in context of acute illness/injury  INTERVENTION:   -Continue bolus feeds of 237 ml of Osmolite 1.5 BID.  -Recommend free water flushes of 30 ml before and after each bolus. -Provides 710 kcal (44% of needs), 29g protein and 482 ml H2O.  -Patient states she will use Ensure at home. -Encouraged the highest calorie and protein version of Ensure she can find.  -Encouraged her to increase bolus feeds to 3 bottles if she is unable to eat much at home.  -Continue Magic cups TID with meals, each supplement provides 290 kcal and 9 grams of protein  -Continue Carnation Instant Breakfast once in the AM, each provides 280 kcal and 13g protein.  NUTRITION DIAGNOSIS:   Moderate Malnutrition related to acute illness(nissen fundoplication complications) as evidenced by percent weight loss, energy intake < or equal to 50% for > or equal to 1 month, mild fat depletion, mild muscle depletion.  Ongoing.  GOAL:   Patient will meet greater than or equal to 90% of their needs  Progressing.  MONITOR:   PO intake, Supplement acceptance, Diet advancement, Weight trends, Labs  ASSESSMENT:   Pt with PMH significant for large hiatal hernia s/p robotic repair with Nissen fundoplication 01/31/2017. Was hospitalized 12/8 for progressive dysphagia with regurgitation with anything PO. Directly admitted 1/29 for same symptoms.   Events: 01/31/17-s/probotic paraesophageal incarcerated hiatal hernia repair and fundoplication. 04/11/17-Pt reports not tolerating anything x 1 week PTA. Intake poor since November.Diagnosed with moderate malnutrition. 04/13/17-s/pLaproscopic lysis of Adhesions, EGD with balloon dilation, G- placement, excision of foreign body esophagus  Patient to discharge today. Reviewed recommendations with patient, encouraged intake and to continue bolus feeds to ensure she is meeting her nutritional needs. Pt  states she plans to administer Ensure supplements through her tube. Reviewed acceptable protein supplements (high calorie, high protein). Encouraged pt to increase to 3 bottles if she is unable to eat anything in a day. Also stated to patient she can administer 1/2 a bottle at a time if that is better tolerated.   Spoke with RN in hallway, reports pt is anxious about tube feeding at home. Pt has received education. RN will allow pt to practice administration with morning bolus feed.   Intake for 2/7: B: 280 kcal, 10g protein L: nothing recorded D: 280 kcal, 11g protein Supplements (bolus feeds): 710 kcal, 29g protein Total: 1270 kcal (79% of needs), 50g protein (62% of needs)  Medications: Reglan solution TID, KCl solution BID Labs reviewed: CBGs: 118-119 Low Phos  Diet Order:  DIET - DYS 1 Room service appropriate? Yes with Assist; Fluid consistency: Thin Diet - low sodium heart healthy  EDUCATION NEEDS:   Education needs have been addressed  Skin:  Skin Assessment: Reviewed RN Assessment  Last BM:  2/519  Height:   Ht Readings from Last 1 Encounters:  04/12/17 4\' 10"  (1.473 m)    Weight:   Wt Readings from Last 1 Encounters:  04/20/17 138 lb 3.7 oz (62.7 kg)    Ideal Body Weight:  45.5 kg  BMI:  Body mass index is 28.89 kg/m.  Estimated Nutritional Needs:   Kcal:  1600-1800 kcal/day  Protein:  80-90 g/day  Fluid:  >1.6 L/day  06/18/17, MS, RD, LDN [name] Inpatient Clinical Dietitian Pager: [phone] After Hours Pager: [phone]

## 2017-04-20 NOTE — Discharge Instructions (Signed)
Gastrostomy Tube Home Guide, Adult A gastrostomy tube is a tube that is surgically placed into the stomach. It is also called a G-tube. G-tubes are used when a person is unable to eat and drink enough on their own to stay healthy. The tube is inserted into the stomach through a small cut (incision) in the skin. This tube is used for:  Feeding.  Giving medication.  Gastrostomy tube care  Wash your hands with soap and water.  Remove the old dressing (if any). Some styles of G-tubes may need a dressing inserted between the skin and the G-tube. Other types of G-tubes do not require a dressing. Ask your health care provider if a dressing is needed.  Check the area where the tube enters the skin (insertion site) for redness, swelling, or pus-like (purulent) drainage. A small amount of clear or tan liquid drainage is normal. Check to make sure scar tissue (skin) is not growing around the insertion site. This could have a raised, bumpy appearance.  A cotton swab can be used to clean the skin around the tube: ? When the G-tube is first put in, a normal saline solution or water can be used to clean the skin. ? Mild soap and warm water can be used when the skin around the G-tube site has healed. ? Roll the cotton swab around the G-tube insertion site to remove any drainage or crusting at the insertion site. Stomach residuals Feeding tube residuals are the amount of liquids that are in the stomach at any given time. Residuals may be checked before giving feedings, medications, or as instructed by your health care provider.  Ask your health care provider if there are instances when you would not start tube feedings depending on the amount or type of contents withdrawn from the stomach.  Check residuals by attaching a syringe to the G-tube and pulling back on the syringe plunger. Note the amount, and return the residual back into the stomach.  Flushing the G-tube  The G-tube should be periodically  flushed with clean warm water to keep it from clogging. ? Flush the G-tube after feedings or medications. Draw up 30 mL of warm water in a syringe. Connect the syringe to the G-tube and slowly push the water into the tube. ? Do not push feedings, medications, or flushes rapidly. Flush the G-tube gently and slowly. ? Only use syringes made for G-tubes to flush medications or feedings. ? Your health care provider may want the G-tube flushed more often or with more water. If this is the case, follow your health care provider's instructions. Feedings Your health care provider will determine whether feedings are given as a bolus (a certain amount given at one time and at scheduled times) or whether feedings will be given continuously on a feeding pump.  Formulas should be given at room temperature.  If feedings are continuous, no more than 4 hours worth of feedings should be placed in the feeding bag. This helps prevent spoilage or accidental excess infusion.  Cover and place unused formula in the refrigerator.  If feedings are continuous, stop the feedings when medications or flushes are given. Be sure to restart the feedings.  Feeding bags and syringes should be replaced as instructed by your health care provider.  Giving medication  In general, it is best if all medications are in a liquid form for G-tube administration. Liquid medications are less likely to clog the G-tube. ? Mix the liquid medication with 30 mL (or amount recommended  by your health care provider) of warm water. ? Draw up the medication into the syringe. ? Attach the syringe to the G-tube and slowly push the mixture into the G-tube. ? After giving the medication, draw up 30 mL of warm water in the syringe and slowly flush the G-tube.  For pills or capsules, check with your health care provider first before crushing medications. Some pills are not effective if they are crushed. Some capsules are sustained-release  medications. ? If appropriate, crush the pill or capsule and mix with 30 mL of warm water. Using the syringe, slowly push the medication through the tube, then flush the tube with another 30 mL of tap water. G-tube problems G-tube was pulled out.  Cause: May have been pulled out accidentally.  Solutions: Cover the opening with clean dressing and tape. Call your health care provider right away. The G-tube should be put in as soon as possible (within 4 hours) so the G-tube opening (tract) does not close. The G-tube needs to be put in at a health care setting. An X-ray needs to be done to confirm placement before the G-tube can be used again.  Redness, irritation, soreness, or foul odor around the gastrostomy site.  Cause: May be caused by leakage or infection.  Solutions: Call your health care provider right away.  Large amount of leakage of fluid or mucus-like liquid present (a large amount means it soaks clothing).  Cause: Many reasons could cause the G-tube to leak.  Solutions: Call your health care provider to discuss the amount of leakage.  Skin or scar tissue appears to be growing where tube enters skin.  Cause: Tissue growth may develop around the insertion site if the G-tube is moved or pulled on excessively.  Solutions: Secure tube with tape so that excess movement does not occur. Call your health care provider.  G-tube is clogged.  Cause: Thick formula or medication.  Solutions: Try to slowly push warm water into the tube with a large syringe. Never try to push any object into the tube to unclog it. Do not force fluid into the G-tube. If you are unable to unclog the tube, call your health care provider right away.  Tips  Head of bed (HOB) position refers to the upright position of a person's upper body. ? When giving medications or a feeding bolus, keep the [facility] up as told by your health care provider. Do this during the feeding and for 1 hour after the feeding or  medication administration. ? If continuous feedings are being given, it is best to keep the [facility] up as told by your health care provider. When ADLs (activities of daily living) are performed and the [facility] needs to be flat, be sure to turn the feeding pump off. Restart the feeding pump when the [facility] is returned to the recommended height.  Do not pull or put tension on the tube.  To prevent fluid backflow, kink the G-tube before removing the cap or disconnecting a syringe.  Check the G-tube length every day. Measure from the insertion site to the end of the G-tube. If the length is longer than previous measurements, the tube may be coming out. Call your health care provider if you notice increasing G-tube length.  Oral care, such as brushing teeth, must be continued.  You may need to remove excess air (vent) from the G-tube. Your health care provider will tell you if this is needed.  Always call your health care provider if you have  questions or problems with the G-tube. Get help right away if:  You have severe abdominal pain, tenderness, or abdominal bloating (distension).  You have nausea or vomiting.  You are constipated or have problems moving your bowels.  The G-tube insertion site is red, swollen, has a foul smell, or has yellow or brown drainage.  You have difficulty breathing or shortness of breath.  You have a fever.  You have a large amount of feeding tube residuals.  The G-tube is clogged and cannot be flushed. This information is not intended to replace advice given to you by your health care provider. Make sure you discuss any questions you have with your health care provider. Document Released: 05/08/2001 Document Revised: 08/05/2015 Document Reviewed: 11/04/2012 Elsevier Interactive Patient Education  2017 [employer]  EATING AFTER YOUR ESOPHAGEAL SURGERY (Stomach Fundoplication, Hiatal Hernia repair, Achalasia surgery,  etc)  ######################################################################  EAT Start with a pureed / full liquid diet (see below) Gradually transition to a high fiber diet with a fiber supplement over the next month after discharge.    WALK Walk an hour a day.  Control your pain to do that.    CONTROL PAIN Control pain so that you can walk, sleep, tolerate sneezing/coughing, go up/down stairs.  HAVE A BOWEL MOVEMENT DAILY Keep your bowels regular to avoid problems.  OK to try a laxative to override constipation.  OK to use an antidairrheal to slow down diarrhea.  Call if not better after 2 tries  CALL IF YOU HAVE PROBLEMS/CONCERNS Call if you are still struggling despite following these instructions. Call if you have concerns not answered by these instructions  ######################################################################   After your esophageal surgery, expect some sticking with swallowing over the next 1-2 months.    If food sticks when you eat, it is called "dysphagia".  This is due to swelling around your esophagus at the wrap & hiatal diaphragm repair.  It will gradually ease off over the next few months.  To help you through this temporary phase, we start you out on a pureed (blenderized) diet.  Your first meal in the hospital was thin liquids.  You should have been given a pureed diet by the time you left the hospital.  We ask patients to stay on a pureed diet for the first 2-3 weeks to avoid anything getting "stuck" near your recent surgery.  Don't be alarmed if your ability to swallow doesn't progress according to this plan.  Everyone is different and some diets can advance more or less quickly.     Some BASIC RULES to follow are:  Maintain an upright position whenever eating or drinking.  Take small bites - just a teaspoon size bite at a time.  Eat slowly.  It may also help to eat only one food at a time.  Consider nibbling through smaller, more frequent  meals & avoid the urge to eat BIG meals  Do not push through feelings of fullness, nausea, or bloatedness  Do not mix solid foods and liquids in the same mouthful  Try not to "wash foods down" with large gulps of liquids.  Avoid carbonated (bubbly/fizzy) drinks.    Avoid foods that make you feel gassy or bloated.  Start with bland foods first.  Wait on trying greasy, fried, or spicy meals until you are tolerating more bland solids well.  Understand that it will be hard to burp and belch at first.  This gradually improves with time.  Expect to be more gassy/flatulent/bloated initially.  Walking will help your body manage it better.  Consider using medications for bloating that contain simethicone such as  Maalox or Gas-X   Eat in a relaxed atmosphere & minimize distractions.  Avoid talking while eating.    Do not use straws.  Following each meal, sit in an upright position (90 degree angle) for 60 to 90 minutes.  Going for a short walk can help as well  If food does stick, don't panic.  Try to relax and let the food pass on its own.  Sipping WARM LIQUID such as strong hot black tea can also help slide it down.   Be gradual in changes & use common sense:  -If you easily tolerating a certain "level" of foods, advance to the next level gradually -If you are having trouble swallowing a particular food, then avoid it.   -If food is sticking when you advance your diet, go back to thinner previous diet (the lower LEVEL) for 1-2 days.  LEVEL 1 = PUREED DIET  Do for the first 2 WEEKS AFTER SURGERY  -Foods in this group are pureed or blenderized to a smooth, mashed potato-like consistency.  -If necessary, the pureed foods can keep their shape with the addition of a thickening agent.   -Meat should be pureed to a smooth, pasty consistency.  Hot broth or gravy may be added to the pureed meat, approximately 1 oz. of liquid per 3 oz. serving of meat. -CAUTION:  If any foods do not puree into  a smooth consistency, swallowing will be more difficult.  (For example, nuts or seeds sometimes do not blend well.)  Hot Foods Cold Foods  Pureed scrambled eggs and cheese Pureed cottage cheese  Baby cereals Thickened juices and nectars  Thinned cooked cereals (no lumps) Thickened milk or eggnog  Pureed [place] toast or pancakes Ensure  Mashed potatoes Ice cream  Pureed parsley, au gratin, scalloped potatoes, candied sweet potatoes Fruit or [place] ice, sherbet  Pureed buttered or alfredo noodles Plain yogurt  Pureed vegetables (no corn or peas) Instant breakfast  Pureed soups and creamed soups Smooth pudding, mousse, custard  Pureed scalloped apples Whipped gelatin  Gravies Sugar, syrup, honey, jelly  Sauces, cheese, tomato, barbecue, white, creamed Cream  Any baby food Creamer  Alcohol in moderation (not beer or champagne) Margarine  Coffee or tea Mayonnaise   Ketchup, mustard   Apple sauce   SAMPLE MENU:  PUREED DIET Breakfast Lunch Dinner   Orange juice, 1/2 cup  Cream of wheat, 1/2 cup  Pineapple juice, 1/2 cup  Pureed [place], barley soup, 3/4 cup  Pureed Hawaiian chicken, 3 oz   Scrambled eggs, mashed or blended with cheese, 1/2 cup  Tea or coffee, 1 cup   Whole milk, 1 cup   Non-dairy creamer, 2 Tbsp.  Mashed potatoes, 1/2 cup  Pureed cooled broccoli, 1/2 cup  Apple sauce, 1/2 cup  Coffee or tea  Mashed potatoes, 1/2 cup  Pureed spinach, 1/2 cup  Frozen yogurt, 1/2 cup  Tea or coffee      LEVEL 2 = SOFT DIET  After your first 2 weeks, you can advance to a soft diet.   Keep on this diet until everything goes down easily.  Hot Foods Cold Foods  White fish Cottage cheese  Stuffed fish Junior baby fruit  Baby food meals Semi thickened juices  Minced soft cooked, scrambled, poached eggs nectars  Souffle & omelets Ripe mashed bananas  Cooked cereals Canned fruit, pineapple sauce, milk  potatoes  Milkshake  Buttered or Alfredo noodles Custard   Cooked cooled vegetable Puddings, including tapioca  Sherbet Yogurt  Vegetable soup or alphabet soup Fruit ice, [place] ice  Gravies Whipped gelatin  Sugar, syrup, honey, jelly Junior baby desserts  Sauces:  Cheese, creamed, barbecue, tomato, white Cream  Coffee or tea Margarine   SAMPLE MENU:  LEVEL 2 Breakfast Lunch Dinner   Orange juice, 1/2 cup  Oatmeal, 1/2 cup  Scrambled eggs with cheese, 1/2 cup  Decaffeinated tea, 1 cup  Whole milk, 1 cup  Non-dairy creamer, 2 Tbsp  Pineapple juice, 1/2 cup  Minced beef, 3 oz  Gravy, 2 Tbsp  Mashed potatoes, 1/2 cup  Minced fresh broccoli, 1/2 cup  Applesauce, 1/2 cup  Coffee, 1 cup  [place], barley soup, 3/4 cup  Minced Hawaiian chicken, 3 oz  Mashed potatoes, 1/2 cup  Cooked spinach, 1/2 cup  Frozen yogurt, 1/2 cup  Non-dairy creamer, 2 Tbsp      LEVEL 3 = CHOPPED DIET  -After all the foods in level 2 (soft diet) are passing through well you should advance up to more chopped foods.  -It is still important to cut these foods into small pieces and eat slowly.  Hot Foods Cold Foods  Poultry Cottage cheese  Chopped Swedish meatballs Yogurt  Meat salads (ground or flaked meat) Milk  Flaked fish (tuna) Milkshakes  Poached or scrambled eggs Soft, cold, dry cereal  Souffles and omelets Fruit juices or nectars  Cooked cereals Chopped canned fruit  Chopped [place] toast or pancakes Canned fruit cocktail  Noodles or pasta (no rice) Pudding, mousse, custard  Cooked vegetables (no frozen peas, corn, or mixed vegetables) Green salad  Canned small sweet peas Ice cream  Creamed soup or vegetable soup Fruit ice, [place] ice  Pureed vegetable soup or alphabet soup Non-dairy creamer  Ground scalloped apples Margarine  Gravies Mayonnaise  Sauces:  Cheese, creamed, barbecue, tomato, white Ketchup  Coffee or tea Mustard   SAMPLE MENU:  LEVEL 3 Breakfast Lunch Dinner   Orange juice, 1/2 cup  Oatmeal, 1/2  cup  Scrambled eggs with cheese, 1/2 cup  Decaffeinated tea, 1 cup  Whole milk, 1 cup  Non-dairy creamer, 2 Tbsp  Ketchup, 1 Tbsp  Margarine, 1 tsp  Salt, 1/4 tsp  Sugar, 2 tsp  Pineapple juice, 1/2 cup  Ground beef, 3 oz  Gravy, 2 Tbsp  Mashed potatoes, 1/2 cup  Cooked spinach, 1/2 cup  Applesauce, 1/2 cup  Decaffeinated coffee  Whole milk  Non-dairy creamer, 2 Tbsp  Margarine, 1 tsp  Salt, 1/4 tsp  Pureed [place], barley soup, 3/4 cup  Barbecue chicken, 3 oz  Mashed potatoes, 1/2 cup  Ground fresh broccoli, 1/2 cup  Frozen yogurt, 1/2 cup  Decaffeinated tea, 1 cup  Non-dairy creamer, 2 Tbsp  Margarine, 1 tsp  Salt, 1/4 tsp  Sugar, 1 tsp    LEVEL 4:  REGULAR FOODS  -Foods in this group are soft, moist, regularly textured foods.   -This level includes meat and breads, which tend to be the hardest things to swallow.   -Eat very slowly, chew well and continue to avoid carbonated drinks. -most people are at this level in 4-6 weeks  Hot Foods Cold Foods  Baked fish or skinned Soft cheeses - cottage cheese  Souffles and omelets Cream cheese  Eggs Yogurt  Stuffed shells Milk  Spaghetti with meat sauce Milkshakes  Cooked cereal Cold dry cereals (no nuts, dried fruit, coconut)  [place] toast or pancakes  Crackers  Buttered toast Fruit juices or nectars  Noodles or pasta (no rice) Canned fruit  Potatoes (all types) Ripe bananas  Soft, cooked vegetables (no corn, lima, or baked beans) Peeled, ripe, fresh fruit  Creamed soups or vegetable soup Cakes (no nuts, dried fruit, coconut)  Canned chicken noodle soup Plain doughnuts  Gravies Ice cream  Bacon dressing Pudding, mousse, custard  Sauces:  Cheese, creamed, barbecue, tomato, white Fruit ice, [place] ice, sherbet  Decaffeinated tea or coffee Whipped gelatin  Pork chops Regular gelatin   Canned fruited gelatin molds   Sugar, syrup, honey, jam, jelly   Cream   Non-dairy   Margarine   Oil    Mayonnaise   Ketchup   Mustard   TROUBLESHOOTING IRREGULAR BOWELS  1) Avoid extremes of bowel movements (no bad constipation/diarrhea)  2) Miralax 17gm mixed in 8oz. water or juice-daily. May use BID as needed.  3) Gas-x,Phazyme, etc. as needed for gas & bloating.  4) Soft,bland diet. No spicy,greasy,fried foods.  5) Prilosec over-the-counter as needed  6) May hold gluten/wheat products from diet to see if symptoms improve.  7) May try probiotics (Align, Activa, etc) to help calm the bowels down  7) If symptoms become worse call back immediately.    If you have any questions please call our office at [facility]: [phone]. Feeding Tube Use Some people who have trouble swallowing or cannot take food or medicine by mouth may need a feeding tube. A feeding tube can go into the nose and down to the stomach or small bowel, or through the skin in the abdomen and into the stomach or small bowel. Liquid food (formula), breast milk, or medicines may be given through the tube. Supplies needed for a tube feeding:  Clean gloves.  Prescribed formula or breast milk.  Appropriate feeding bag set, gravity drip tubing set, or 30-60-mL syringe with feeding extension tubing.  Feeding tube pump or syringe pump as needed.  Pole as needed.  20-60-mL syringe to check tube placement.  A syringe to flush the feeding tube.  Container.  Water. How to give a feeding through a feeding tube pump 2. Have all supplies ready and available. 3. Wash hands well. 4. Put on clean gloves. 5. Check the placement of the feeding tube as directed. 6. Raise the head of the person 30-45 degrees or as directed. 7. Pour up to 4 hours of room-temperature feeding into the feeding bag set. 8. Hang the feeding bag set from a pole. 9. Flush the entire feeding bag set with the feeding. 10. Load the feeding bag set into the feeding tube pump. 11. Cap the feeding bag set. 12. Uncap the feeding  tube. 13. Using a syringe, flush the feeding tube with water as directed. 14. Uncap the feeding bag set. 15. Connect the feeding bag set to the feeding tube. 16. Remove gloves. 17. Wash hands well. 18. Set the prescribed feeding rate. 19. Start the feeding tube pump. How to give a feeding through a syringe pump 2. Have all supplies ready and available. 3. Wash hands well. 4. Put on clean gloves. 5. Check the placement of the feeding tube as directed. 6. Raise the head of the person 30-45 degrees or as directed. 7. Draw up to 4 hours of room-temperature formula into the correctly sized syringe. 8. Attach the syringe to the feeding extension tubing. 9. Flush the entire feeding extension tubing with the feeding. 10. Load the syringe and feeding extension tubing into the  syringe pump. 11. Cap the feeding extension tubing. 12. Uncap the feeding tube. 13. Using a syringe, flush the feeding tube with water as directed. 14. Uncap the feeding extension tubing. 15. Connect the feeding extension tubing to the feeding tube. 16. Remove gloves. 17. Wash hands well. 18. Set the prescribed feeding rate. 19. Start the syringe pump. How to give a feeding using the gravity method 1. Have all supplies ready and available. 2. Wash hands well. 3. Put on clean gloves. 4. Check the placement of the feeding tube as directed. 5. Raise the head of the person 30-45 degrees or as directed. 6. Close the clamp on the gravity drip tubing set. 7. Pour up to 4 hours of room-temperature feeding into the bag of a gravity drip tubing set. Or, if using a ready-to-hang formula container, connect the gravity drip tubing set to the ready-to-hang container. 8. Hang the feeding with the attached gravity drip tubing set from a pole. 9. Open the roller clamp on the gravity drip tubing to fill the entire tubing with the feeding. 10. Close the roller clamp. 11. Cap the gravity drip tubing. 12. Uncap the feeding  tube. 13. Using a syringe, flush the feeding tube with water as directed. 14. Uncap the gravity drip tubing. 15. Connect the gravity drip tubing to the feeding tube. 16. Using the roller clamp, adjust the feeding rate to deliver the feeding at the prescribed rate. 17. Remove gloves. 18. Wash hands well. How to give a bolus feeding through a feeding tube 2. Remove the plunger from the syringe. 3. Attach the syringe to the feeding tube. 4. Pour the feeding into the syringe. 5. Administer the feeding at the prescribed rate by means of gravity. A feeding bag may also be used for bolus feedings, depending on the volume of feeding given. Supplies needed for giving medicines through a feeding tube  60-mL syringe.  Container.  Water.  Medicine.  Pill crusher, if medicine is in tablet form.  Clean gloves. How to give medicines through a feeding tube 1. Have all supplies ready and available. 2. Wash hands well. 3. If the medicine cannot be given with the feeding, stop the feeding 60 minutes before giving the medicine. 4. If the person needs to take medicine on an empty stomach, consider not giving a feeding for up to 2 hours (hold time) before giving medicine. Talk to the health care provider about the correct hold times. 5. Fill a container with 50-100 mL of warm water. 6. Prepare medicine that will go into the feeding tube: ? Liquid--Measure the prescribed medicine dose. ? Tablet--Crush the tablet using a pill-crushing device. Grind the pill to a fine powder. Dissolve the powder in at least 30 mL of warm water or as directed. If there is more than 1 tablet, crush and dilute each one individually. ? Capsule--Open a capsule containing dry pellets or pierce a capsule containing liquid (gelcap). Empty the contents in 30 mL of warm water or as directed. Gelcaps can also be dissolved in warm water. 7. Raise the head of the person 30-45 degrees or as directed. 8. Wash hands well. 9. Put on  clean gloves. 10. If a continuous tube feeding is infusing, stop the tube feeding. 11. If applicable, close the tubing clamp. 12. Disconnect the feeding bag set, or the gravity drip tubing set, from the feeding tube. 13. Cap the feeding bag set or gravity drip tubing set. 14. Check the placement of the feeding tube as directed. 15.  Draw up 30 mL of water into a syringe or as directed. 16. Insert the tip of the syringe into the feeding tube. 17. Using the syringe, flush the feeding tube with water. 18. Remove the plunger of the syringe or replace the syringe with a syringe that contains medicine. 19. Give the medicine as directed. ? If giving only 1 dose of medicine, flush the feeding tube with water after giving the medicine. ? If giving more than 1 dose of medicine, give each separately. Flush the feeding tube between medicines and after the last dose of medicine. 20. If a tube feeding will not be continued after the medicine, cap the feeding tube. Position the person to a comfortable position, but keep his or her head raised for 1 hour after giving the medicine. 21. If a tube feeding will be continued after the medicine, but the medicine cannot be given with the feeding, continue to hold the feeding for 30-60 minutes after the medicine is given. When appropriate, reconnect the feeding bag set, or gravity drip tubing set, to the feeding tube. 22. Throw away or clean used supplies as directed. 23. Remove gloves. 24. Wash hands well. This information is not intended to replace advice given to you by your health care provider. Make sure you discuss any questions you have with your health care provider. Document Released: 02/14/2012 Document Revised: 11/03/2015 Document Reviewed: 10/12/2011 Elsevier Interactive Patient Education  2017 [employer].

## 2017-04-20 NOTE — Progress Notes (Signed)
Home health order by MD. Choice offered to pt and [facility] chosen. [facility] rep alerted of orders. No DME needed per pt. Sandford Craze RN,BSN,NCM [phone]

## 2017-04-20 NOTE — Progress Notes (Signed)
Pt alert and oriented.  D/C instructions and prescription was given. All questions answered. Pt was d/cd home with spouse.  

## 2017-04-20 NOTE — Discharge Summary (Signed)
Physician Discharge Summary  Patient ID: Sydney [name] MRN: [id number] DOB/AGE: 73/07/1945  72 y.o.  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 04/13/2017 Active Problems:   Paraesophageal hiatal hernia s/p repair 01/31/2017   Gastroesophageal reflux disease   Obesity   Hypertension   Failure to thrive (0-17)   Esophageal obstruction - partial   Hypokalemia   Nausea & vomiting   Malnutrition of moderate degree   Gastrostomy tube in place [facility])   Hypomagnesemia   Hypophosphatemia   7 Days Post-Op  04/13/2017  POST-OPERATIVE DIAGNOSIS:   GERD  SURGERY:  04/13/2017  Procedure(s): Laproscopic lysis of Adhesions ESOPHAGOGASTRODUODENOSCOPY (EGD) WITH BALLOON DILATION LAPAROSCOPIC GASTRIC  EXPLORATION GASTROSTOMY TUBE PLACEMENT ESOPHAGEAL STENT PLACEMENT EXCISION OF FOREIGN BODY X ESOPHAGUS  STEROID INJECTION ESOPHAGUS   SURGEON:    Surgeon(s): Michael Boston, MD  Consults: GI  Hospital Course:    Patient with giant paraesophageal hiatal hernia with 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  Underwent robotic takedown and primary repair with absorbable mesh reinforcement and fundoplication.  Struggled with some dysphasia that improved initially on steroids.  Then returned with inability to keep anything down.  She was admitted.  Swallow study showed extremely tight esophageal stricture with side passage suspicious for lateral esophagogastric fistula to the side wrap.  Gastroenterology consultation made for endoscopic dilation.  Exposed sutures and pledgets noted.  Attempt aborted.  I took her to the OR arm.  I  removed pledgets and sutures endoscopically.  Did balloon dilation up to 15 mm.  Placed a covered esophageal stent.  Placed a gastrostomy tube.  Postoperatively, the patient's nausea bloating and pain were controlled.  On tube feeds.  She had a swallow study that showed the cover stent sealing off the side leak and showing much improved dilation of the esophagus.  By the time of discharge she was tolerating a pured diet somewhat.  Transition to her to a few occasional bolus feeds through the gastrostomy tube.  Training was given to patient and her husband.  Home health is being set up for gastrostomy tube care and home feeding.  Patient's ileus resolved.  She had significant hypomagnesemia, hypophosphatemia, hypokalemia.  These were aggressively treated and corrected.  Compliance insured with acid blockade with proton pump inhibitor as well as H2 blockade.  Based upon improvements and stabilization, I felt it was reasonable for her to be discharged home with home health care and tube feeds.  Close follow-up.  Hopefully eventually we can gradually dilate the wrap back up in the leak will seal the gastrostomy tube can be removed.  Otherwise, she may require repeat operation.  But would like to hold off until the inflammatory process has abated in a few months.  Hopefully it will not come to that.  Based on meeting discharge criteria and continuing to recover, I felt it was safe for the patient to be discharged from the hospital to further recover with close followup. Postoperative recommendations were discussed in detail.  They are written as well.  Discharged Condition: good  Disposition:  Follow-up Information    Michael Boston, MD. Go on 04/30/2017.   Specialty:  General Surgery Contact information: [place]  [place] [address] [phone]           01-Home or Self Care  Discharge Instructions    Call MD for:   Complete by:  As directed    FEVER > 101.5 F   (temperatures < 101.5 F are not significant)   Call MD for:  extreme fatigue   Complete by:  As directed    Call MD for:  persistant dizziness or light-headedness   Complete by:  As directed    Call MD for:  persistant nausea and vomiting   Complete by:  As directed    Call MD for:  redness, tenderness, or signs of infection (pain, swelling, redness, odor or green/yellow discharge around incision site)   Complete by:  As directed    Call MD for:  severe uncontrolled pain   Complete by:  As directed    Diet - low sodium heart healthy   Complete by:  As directed    Follow a light diet the first few days at home.   Start with a bland diet such as soups, liquids, starchy foods, low fat foods, etc.   If you feel full, bloated, or constipated, stay on a full liquid or pureed/blenderized diet for a few days until you feel better and no longer constipated. Be sure to drink plenty of fluids every day to avoid getting dehydrated (feeling dizzy, not urinating, etc.). Gradually add a fiber supplement to your diet   Discharge instructions   Complete by:  As directed    See Discharge Instructions If you are not getting better after two weeks or are noticing you are getting worse, contact our office [phone] for further advice.  We may need to adjust your medications, re-evaluate you in the office, send you to the emergency room, or see what other things we can do to help. The clinic staff is available to answer your questions during regular business hours (8:30am-5pm).  Please don't hesitate to call and ask to speak to one of our nurses for clinical concerns.    A surgeon from [facility] is always on call at the hospitals 24 hours/day If you have a medical emergency, go to the nearest emergency room or call 911.   Driving Restrictions   Complete by:  As directed    You may drive when you are no longer taking narcotic prescription pain medication, you can comfortably wear a  seatbelt, and you can safely make sudden turns/stops to protect yourself without hesitating due to pain.   Increase activity slowly   Complete by:  As directed    Start light daily activities --- self-care, walking, climbing stairs- beginning the day after surgery.  Gradually increase activities as tolerated.  Control your pain to be active.  Stop when you are tired.  Ideally, walk several times a day, eventually an hour a day.   Most people are back to most day-to-day activities in a few weeks.  It takes 4-8 weeks to get back to unrestricted, intense activity. If you can walk 30 minutes without difficulty, it is safe to try more intense activity such as jogging, treadmill, bicycling, low-impact aerobics, swimming, etc. Save the most intensive and strenuous activity for last (Usually 4-8 weeks after surgery) such as sit-ups, heavy lifting, contact sports, etc.  Refrain from any intense heavy lifting or straining until you are off narcotics for pain control.  You will have off days, but things should improve week-by-week. DO NOT PUSH THROUGH PAIN.  Let  pain be your guide: If it hurts to do something, don't do it.  Pain is your body warning you to avoid that activity for another week until the pain goes down.   Lifting restrictions   Complete by:  As directed    If you can walk 30 minutes without difficulty, it is safe to try more intense activity such as jogging, treadmill, bicycling, low-impact aerobics, swimming, etc. Save the most intensive and strenuous activity for last (Usually 4-8 weeks after surgery) such as sit-ups, heavy lifting, contact sports, etc.  Refrain from any intense heavy lifting or straining until you are off narcotics for pain control.  You will have off days, but things should improve week-by-week. DO NOT PUSH THROUGH PAIN.  Let pain be your guide: If it hurts to do something, don't do it.  Pain is your body warning you to avoid that activity for another week until the pain goes  down.   May walk up steps   Complete by:  As directed    No wound care   Complete by:  As directed    It is good for closed incision and even open wounds to be washed every day.  Shower every day.  Short baths are fine.  Wash the incisions and wounds clean with soap & water.    If you have a closed incision(s), wash the incision with soap & water every day.  You may leave closed incisions open to air if it is dry.   You may cover the incision with clean gauze & replace it after your daily shower for comfort. If you have skin tapes (Steristrips) or skin glue (Dermabond) on your incision, leave them in place.  They will fall off on their own like a scab.  You may trim any edges that curl up with clean scissors.  If you have staples, set up an appointment for them to be removed in the office in 10 days after surgery.  If you have a drain, wash around the skin exit site with soap & water and place a new dressing of gauze or band aid around the skin every day.  Keep the drain site clean & dry.   Sexual Activity Restrictions   Complete by:  As directed    You may have sexual intercourse when it is comfortable. If it hurts to do something, stop.      Allergies as of 04/20/2017   No Known Allergies     Medication List    TAKE these medications   acetaminophen 500 MG tablet Commonly known as:  TYLENOL Take 500 mg by mouth every 4 (four) hours as needed for mild pain or headache (for pain.).   chlorthalidone 25 MG tablet Commonly known as:  HYGROTON Take 25 mg daily as needed by mouth (for fluid retention/swelling.).   feeding supplement (OSMOLITE 1.5 CAL) Liqd Place 237 mLs into feeding tube 2 (two) times daily.   ondansetron 4 MG tablet Commonly known as:  ZOFRAN Take 1 tablet (4 mg total) by mouth every 8 (eight) hours as needed for nausea.   oxyCODONE 5 MG/5ML solution Commonly known as:  ROXICODONE Place 5-10 mLs (5-10 mg total) into feeding tube every 4 (four) hours as needed for  severe pain or breakthrough pain.   pantoprazole sodium 40 mg/20 mL Pack Commonly known as:  PROTONIX Place 20 mLs (40 mg total) into feeding tube daily.   promethazine 25 MG suppository Commonly known as:  PHENERGAN Place 1 suppository (25  mg total) rectally every 6 (six) hours as needed for nausea.   ranitidine 300 MG tablet Commonly known as:  ZANTAC Take 0.5 tablets (150 mg total) by mouth at bedtime. What changed:    how much to take  when to take this  reasons to take this   simethicone 40 MG/0.6ML drops Commonly known as:  MYLICON Take 0.6 mLs (40 mg total) by mouth 4 (four) times daily.       Significant Diagnostic Studies:  Results for orders placed or performed during the hospital encounter of 04/10/17 (from the past 72 hour(s))  Glucose, capillary     Status: Abnormal   Collection Time: 04/17/17  9:22 AM  Result Value Ref Range   Glucose-Capillary 113 (H) 65 - 99 mg/dL  Glucose, capillary     Status: Abnormal   Collection Time: 04/17/17 12:22 PM  Result Value Ref Range   Glucose-Capillary 111 (H) 65 - 99 mg/dL  Glucose, capillary     Status: None   Collection Time: 04/17/17  5:08 PM  Result Value Ref Range   Glucose-Capillary 98 65 - 99 mg/dL  Glucose, capillary     Status: Abnormal   Collection Time: 04/17/17  8:11 PM  Result Value Ref Range   Glucose-Capillary 108 (H) 65 - 99 mg/dL  Glucose, capillary     Status: Abnormal   Collection Time: 04/17/17 11:51 PM  Result Value Ref Range   Glucose-Capillary 120 (H) 65 - 99 mg/dL  Glucose, capillary     Status: Abnormal   Collection Time: 04/18/17  3:24 AM  Result Value Ref Range   Glucose-Capillary 102 (H) 65 - 99 mg/dL  Basic metabolic panel     Status: Abnormal   Collection Time: 04/18/17  5:14 AM  Result Value Ref Range   Sodium 137 135 - 145 mmol/L   Potassium 2.9 (L) 3.5 - 5.1 mmol/L   Chloride 102 101 - 111 mmol/L   CO2 28 22 - 32 mmol/L   Glucose, Bld 102 (H) 65 - 99 mg/dL   BUN 7 6 - 20  mg/dL   Creatinine, Ser 0.35 (L) 0.44 - 1.00 mg/dL   Calcium 8.2 (L) 8.9 - 10.3 mg/dL   GFR calc non Af Amer >60 >60 mL/min   GFR calc Af Amer >60 >60 mL/min    Comment: (NOTE) The eGFR has been calculated using the CKD EPI equation. This calculation has not been validated in all clinical situations. eGFR's persistently <60 mL/min signify possible Chronic Kidney Disease.    Anion gap 7 5 - 15    Comment: Performed at [facility], [place] [address] [place], [place] [address]  CBC     Status: Abnormal   Collection Time: 04/18/17  5:14 AM  Result Value Ref Range   WBC 7.9 4.0 - 10.5 K/uL   RBC 4.35 3.87 - 5.11 MIL/uL   Hemoglobin 11.6 (L) 12.0 - 15.0 g/dL   HCT 34.6 (L) 36.0 - 46.0 %   MCV 79.5 78.0 - 100.0 fL   MCH 26.7 26.0 - 34.0 pg   MCHC 33.5 30.0 - 36.0 g/dL   RDW 15.5 11.5 - 15.5 %   Platelets 275 150 - 400 K/uL    Comment: Performed at [facility], [place] [address] [place], [place] [address]  Phosphorus     Status: Abnormal   Collection Time: 04/18/17  5:14 AM  Result Value Ref Range   Phosphorus 1.9 (L) 2.5 - 4.6 mg/dL  Comment: Performed at [facility], [place] [address], [place], [place] [address]  Magnesium     Status: None   Collection Time: 04/18/17  5:14 AM  Result Value Ref Range   Magnesium 1.7 1.7 - 2.4 mg/dL    Comment: Performed at [facility], [place] [address], [place], [place] [address]  Glucose, capillary     Status: Abnormal   Collection Time: 04/18/17  7:44 AM  Result Value Ref Range   Glucose-Capillary 113 (H) 65 - 99 mg/dL  Glucose, capillary     Status: Abnormal   Collection Time: 04/18/17 12:26 PM  Result Value Ref Range   Glucose-Capillary 115 (H) 65 - 99 mg/dL  Glucose, capillary     Status: Abnormal   Collection Time: 04/18/17  4:07 PM  Result Value Ref Range   Glucose-Capillary 135 (H) 65 - 99 mg/dL  Glucose, capillary     Status: Abnormal   Collection Time:  04/18/17  7:48 PM  Result Value Ref Range   Glucose-Capillary 185 (H) 65 - 99 mg/dL  Glucose, capillary     Status: Abnormal   Collection Time: 04/18/17 11:59 PM  Result Value Ref Range   Glucose-Capillary 113 (H) 65 - 99 mg/dL  Glucose, capillary     Status: Abnormal   Collection Time: 04/19/17  3:34 AM  Result Value Ref Range   Glucose-Capillary 121 (H) 65 - 99 mg/dL  Basic metabolic panel     Status: Abnormal   Collection Time: 04/19/17  6:01 AM  Result Value Ref Range   Sodium 136 135 - 145 mmol/L   Potassium 2.8 (L) 3.5 - 5.1 mmol/L   Chloride 99 (L) 101 - 111 mmol/L   CO2 31 22 - 32 mmol/L   Glucose, Bld 110 (H) 65 - 99 mg/dL   BUN 5 (L) 6 - 20 mg/dL   Creatinine, Ser 0.37 (L) 0.44 - 1.00 mg/dL   Calcium 7.8 (L) 8.9 - 10.3 mg/dL   GFR calc non Af Amer >60 >60 mL/min   GFR calc Af Amer >60 >60 mL/min    Comment: (NOTE) The eGFR has been calculated using the CKD EPI equation. This calculation has not been validated in all clinical situations. eGFR's persistently <60 mL/min signify possible Chronic Kidney Disease.    Anion gap 6 5 - 15    Comment: Performed at [facility], [place] [address] [place], [place] [address]  CBC     Status: Abnormal   Collection Time: 04/19/17  6:01 AM  Result Value Ref Range   WBC 9.8 4.0 - 10.5 K/uL   RBC 4.26 3.87 - 5.11 MIL/uL   Hemoglobin 11.3 (L) 12.0 - 15.0 g/dL   HCT 33.1 (L) 36.0 - 46.0 %   MCV 77.7 (L) 78.0 - 100.0 fL   MCH 26.5 26.0 - 34.0 pg   MCHC 34.1 30.0 - 36.0 g/dL   RDW 15.7 (H) 11.5 - 15.5 %   Platelets 275 150 - 400 K/uL    Comment: Performed at [facility], [place] [address], [place], [place] [address]  Phosphorus     Status: None   Collection Time: 04/19/17  6:01 AM  Result Value Ref Range   Phosphorus 3.8 2.5 - 4.6 mg/dL    Comment: Performed at [facility], [place] [address], [place], [place] [address]  Magnesium     Status: None   Collection Time: 04/19/17   6:01 AM  Result Value Ref  Range   Magnesium 2.3 1.7 - 2.4 mg/dL    Comment: Performed at [facility], [place] [address], [place], [place] [address]  Glucose, capillary     Status: Abnormal   Collection Time: 04/19/17  7:51 AM  Result Value Ref Range   Glucose-Capillary 107 (H) 65 - 99 mg/dL  Glucose, capillary     Status: Abnormal   Collection Time: 04/19/17 11:42 AM  Result Value Ref Range   Glucose-Capillary 111 (H) 65 - 99 mg/dL  Glucose, capillary     Status: Abnormal   Collection Time: 04/19/17  3:58 PM  Result Value Ref Range   Glucose-Capillary 125 (H) 65 - 99 mg/dL  Glucose, capillary     Status: Abnormal   Collection Time: 04/19/17  7:37 PM  Result Value Ref Range   Glucose-Capillary 167 (H) 65 - 99 mg/dL  Glucose, capillary     Status: Abnormal   Collection Time: 04/19/17 11:58 PM  Result Value Ref Range   Glucose-Capillary 110 (H) 65 - 99 mg/dL  Glucose, capillary     Status: Abnormal   Collection Time: 04/20/17  3:43 AM  Result Value Ref Range   Glucose-Capillary 118 (H) 65 - 99 mg/dL  Phosphorus     Status: Abnormal   Collection Time: 04/20/17  5:49 AM  Result Value Ref Range   Phosphorus 1.9 (L) 2.5 - 4.6 mg/dL    Comment: Performed at [facility], [place] [address], [place], [place] [address]  Magnesium     Status: None   Collection Time: 04/20/17  5:49 AM  Result Value Ref Range   Magnesium 2.0 1.7 - 2.4 mg/dL    Comment: Performed at [facility], [place] [address] [place], [place] [address]  Basic metabolic panel     Status: Abnormal   Collection Time: 04/20/17  5:49 AM  Result Value Ref Range   Sodium 135 135 - 145 mmol/L   Potassium 4.5 3.5 - 5.1 mmol/L    Comment: DELTA CHECK NOTED REPEATED TO VERIFY NO VISIBLE HEMOLYSIS    Chloride 103 101 - 111 mmol/L   CO2 26 22 - 32 mmol/L   Glucose, Bld 119 (H) 65 - 99 mg/dL   BUN 6 6 - 20 mg/dL   Creatinine, Ser 0.34 (L) 0.44 - 1.00 mg/dL   Calcium 8.2  (L) 8.9 - 10.3 mg/dL   GFR calc non Af Amer >60 >60 mL/min   GFR calc Af Amer >60 >60 mL/min    Comment: (NOTE) The eGFR has been calculated using the CKD EPI equation. This calculation has not been validated in all clinical situations. eGFR's persistently <60 mL/min signify possible Chronic Kidney Disease.    Anion gap 6 5 - 15    Comment: Performed at [facility], [place] [address] [place], [place] [address]  Glucose, capillary     Status: Abnormal   Collection Time: 04/20/17  8:16 AM  Result Value Ref Range   Glucose-Capillary 119 (H) 65 - 99 mg/dL    Dg Esophagus W/water Sol Cm  Result Date: 04/16/2017 CLINICAL DATA:  Postop Nissen fundoplication repair and esophageal stenting EXAM: ESOPHOGRAM/BARIUM SWALLOW TECHNIQUE: Single contrast examination was performed using  75 mL Isovue 300. FLUOROSCOPY TIME:  Fluoroscopy Time:  1.3 min Radiation Exposure Index (if provided by the fluoroscopic device): 26 mGy Number of Acquired Spot Images: 0 COMPARISON:  None. FINDINGS: Fluoroscopic evaluation of swallowing demonstrates widely patent distal esophageal stent. No evidence of leak  or extravasation. Slow emptying of the stomach, likely related to postoperative gastroparesis. IMPRESSION: Distal esophageal stent widely patent.  No evidence of leak. Electronically Signed   By: Rolm Baptise M.D.   On: 04/16/2017 10:45    Discharge Exam: Blood pressure 110/63, pulse (!) 105, temperature 99.3 F (37.4 C), temperature source Oral, resp. rate 18, height 4' 10"  (1.473 m), weight 62.7 kg (138 lb 3.7 oz), SpO2 96 %.  General: Pt awake/alert/oriented x4 in No acute distress Eyes: PERRL, normal EOM.  Sclera clear.  No icterus Neuro: CN II-XII intact w/o focal sensory/motor deficits. Lymph: No head/neck/groin lymphadenopathy Psych:  No delerium/psychosis/paranoia HENT: Normocephalic, Mucus membranes moist.  No thrush Neck: Supple, No tracheal deviation Chest: No chest wall pain w good  excursion CV:  Pulses intact.  Regular rhythm MS: Normal AROM mjr joints.  No obvious deformity Abdomen: Soft.  Nondistended.  Mildly tender at G tube.  No evidence of peritonitis.  No incarcerated hernias. Ext:  SCDs BLE.  No mjr edema.  No cyanosis Skin: No petechiae / purpura  Past Medical History:  Diagnosis Date  . Arthritis   . Hiatal hernia with obstruction but no gangrene   . Hypertension   . Obesity   . 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 Seasonal allergies   . Thyroid nodule   . Vertigo     Past Surgical History:  Procedure Laterality Date  . CATARACT EXTRACTION, BILATERAL  2016  . CHOLECYSTECTOMY    . ESOPHAGEAL MANOMETRY N/A 01/03/2017   Procedure: ESOPHAGEAL MANOMETRY (EM);  Surgeon: Mauri Pole, MD;  Location: [facility];  Service: Endoscopy;  Laterality: N/A;  . ESOPHAGEAL STENT PLACEMENT  04/13/2017   Procedure: ESOPHAGEAL STENT PLACEMENT;  Surgeon: Michael Boston, MD;  Location: [facility];  Service: General;;  . ESOPHAGOGASTRODUODENOSCOPY N/A 04/12/2017   Procedure: ESOPHAGOGASTRODUODENOSCOPY (EGD);  Surgeon: Milus Banister, MD;  Location: [facility];  Service: Endoscopy;  Laterality: N/A;  . ESOPHAGOGASTRODUODENOSCOPY  04/13/2017   Procedure: ESOPHAGOGASTRODUODENOSCOPY (EGD) WITH BALLOON DILATION;  Surgeon: Michael Boston, MD;  Location: [facility];  Service: General;;  . FOREIGN BODY RETRIEVAL N/A 04/13/2017   Procedure: EXCISION OF FOREIGN BODY X ESOPHAGUS ;  Surgeon: Michael Boston, MD;  Location: [facility];  Service: General;  Laterality: N/A;  . INSERTION OF MESH N/A 01/31/2017   Procedure: INSERTION OF MESH;  Surgeon: Michael Boston, MD;  Location: [facility];  Service: General;  Laterality: N/A;  . LAPAROSCOPIC NISSEN FUNDOPLICATION N/A 10/14/4194   Procedure: Laproscopic lysis of Adhesions;  Surgeon: Michael Boston, MD;  Location: [facility];  Service: General;  Laterality: N/A;  . LAPAROSCOPIC ROUX-EN-Y GASTRIC BYPASS WITH UPPER ENDOSCOPY AND REMOVAL OF LAP BAND   04/13/2017   Procedure: LAPAROSCOPIC GASTRIC  EXPLORATION;  Surgeon: Michael Boston, MD;  Location: [facility];  Service: General;;  . MOUTH SURGERY    . STERIOD INJECTION  04/13/2017   Procedure: STEROID INJECTION ESOPHAGUS ;  Surgeon: Michael Boston, MD;  Location: [facility];  Service: General;;  . TUBAL LIGATION      Social History   Socioeconomic History  . Marital status: Married    Spouse name: Not on file  . Number of children: 2  . Years of education: Not on file  . Highest education level: Not on file  Social Needs  . Financial resource strain: Not on file  . Food insecurity - worry: Not on file  . Food insecurity - inability: Not on file  . Transportation needs - medical: Not on  file  . Transportation needs - non-medical: Not on file  Occupational History  . Occupation: retired  Tobacco Use  . Smoking status: Never Smoker  . Smokeless tobacco: Never Used  Substance and Sexual Activity  . Alcohol use: No  . Drug use: No  . Sexual activity: Not on file  Other Topics Concern  . Not on file  Social History Narrative   4 grandsons, 5 great grands    Family History  Problem Relation Age of Onset  . Hypertension Mother   . Macular degeneration Mother        legally blind  . Heart Problems Mother        skips a beat  . Heart attack Father   . Leukemia Father   . Colon cancer Neg Hx   . Rectal cancer Neg Hx   . Throat cancer Neg Hx   . Esophageal cancer Neg Hx   . Stomach cancer Neg Hx     Current Facility-Administered Medications  Medication Dose Route Frequency Provider Last Rate Last Dose  . 0.9 %  sodium chloride infusion  250 mL Intravenous PRN Michael Boston, MD      . oxyCODONE (ROXICODONE) 5 MG/5ML solution 5-10 mg  5-10 mg Per Tube Q4H PRN Michael Boston, MD   5 mg at 04/19/17 1946   And  . acetaminophen (TYLENOL) solution 500-1,000 mg  500-1,000 mg Per Tube Q6H PRN Michael Boston, MD   650 mg at 04/19/17 1241  . alum & mag hydroxide-simeth (MAALOX/MYLANTA)  200-200-20 MG/5ML suspension 30 mL  30 mL Oral Q6H PRN Michael Boston, MD   30 mL at 04/20/17 0612  . bisacodyl (DULCOLAX) suppository 10 mg  10 mg Rectal Q12H PRN Michael Boston, MD      . chlorproMAZINE (THORAZINE) 25 mg in sodium chloride 0.9 % 25 mL IVPB  25 mg Intravenous Q6H PRN Michael Boston, MD      . diphenhydrAMINE (BENADRYL) 12.5 MG/5ML elixir 12.5 mg  12.5 mg Oral Q6H PRN Michael Boston, MD       Or  . diphenhydrAMINE (BENADRYL) injection 12.5 mg  12.5 mg Intravenous Q6H PRN Michael Boston, MD      . feeding supplement (OSMOLITE 1.5 CAL) liquid 237 mL  237 mL Per Tube BID Michael Boston, MD   237 mL at 04/19/17 1946  . fentaNYL (SUBLIMAZE) injection 25-50 mcg  25-50 mcg Intravenous Q1H PRN Michael Boston, MD   50 mcg at 04/14/17 1111  . guaiFENesin-dextromethorphan (ROBITUSSIN DM) 100-10 MG/5ML syrup 10 mL  10 mL Oral Q4H PRN Michael Boston, MD      . hydrALAZINE (APRESOLINE) injection 5-10 mg  5-10 mg Intravenous Q4H PRN Michael Boston, MD      . hydrocortisone cream 1 % 1 application  1 application Topical TID PRN Michael Boston, MD      . hydrocortisone-pramoxine [facility]) 2.5-1 % rectal cream 1 application  1 application Rectal S0F PRN Michael Boston, MD      . lip balm (CARMEX) ointment 1 application  1 application Topical BID Michael Boston, MD   1 application at 09/32/35 2252  . magic mouthwash  15 mL Oral QID PRN Michael Boston, MD   15 mL at 04/19/17 1806  . MEDLINE mouth rinse  15 mL Mouth Rinse Tana Conch, MD   15 mL at 04/18/17 1723  . menthol-cetylpyridinium (CEPACOL) lozenge 3 mg  1 lozenge Oral PRN Michael Boston, MD      . methocarbamol (ROBAXIN) 1,000 mg  in dextrose 5 % 50 mL IVPB  1,000 mg Intravenous Q6H PRN Michael Boston, MD      . metoCLOPramide (REGLAN) 10 MG/10ML solution 5 mg  5 mg Per Tube TID Rogelia Mire, MD   5 mg at 04/20/17 0600  . metoCLOPramide (REGLAN) injection 10 mg  10 mg Intravenous Q6H PRN Michael Boston, MD      . metoprolol tartrate  (LOPRESSOR) injection 5 mg  5 mg Intravenous Q6H PRN Michael Boston, MD      . neomycin-bacitracin-polymyxin (NEOSPORIN) ointment 1 application  1 application Topical Daily Michael Boston, MD   1 application at 45/99/77 1427  . ondansetron (ZOFRAN-ODT) disintegrating tablet 4 mg  4 mg Oral Q6H PRN Michael Boston, MD       Or  . ondansetron [facility]) injection 4 mg  4 mg Intravenous Q6H PRN Michael Boston, MD   4 mg at 04/15/17 1218  . pantoprazole sodium (PROTONIX) 40 mg/20 mL oral suspension 40 mg  40 mg Per Tube BID Michael Boston, MD   40 mg at 04/19/17 2252  . phenol (CHLORASEPTIC) mouth spray 1-2 spray  1-2 spray Mouth/Throat PRN Michael Boston, MD      . potassium chloride 20 MEQ/15ML (10%) solution 40 mEq  40 mEq Per Tube BID Michael Boston, MD   40 mEq at 04/19/17 2252  . prochlorperazine (COMPAZINE) injection 5-10 mg  5-10 mg Intravenous Q4H PRN Michael Boston, MD   10 mg at 04/15/17 1754  . simethicone (MYLICON) chewable tablet 40 mg  40 mg Oral Q6H PRN Michael Boston, MD      . sodium chloride flush (NS) 0.9 % injection 3 mL  3 mL Intravenous Gorden Harms, MD   3 mL at 04/19/17 2253  . sodium chloride flush (NS) 0.9 % injection 3 mL  3 mL Intravenous PRN Michael Boston, MD         No Known Allergies  Signed: Morton Peters, M.D., F.A.C.S. Gastrointestinal and Minimally Invasive Surgery [facility] [address], [place], [place] [address] [phone] Main / Paging   04/20/2017, 8:25 AM

## 2017-04-21 DIAGNOSIS — K219 Gastro-esophageal reflux disease without esophagitis: Secondary | ICD-10-CM | POA: Diagnosis not present

## 2017-04-21 DIAGNOSIS — K316 Fistula of stomach and duodenum: Secondary | ICD-10-CM | POA: Diagnosis not present

## 2017-04-21 DIAGNOSIS — K222 Esophageal obstruction: Secondary | ICD-10-CM | POA: Diagnosis not present

## 2017-04-21 DIAGNOSIS — K9189 Other postprocedural complications and disorders of digestive system: Secondary | ICD-10-CM | POA: Diagnosis not present

## 2017-04-21 DIAGNOSIS — Z79891 Long term (current) use of opiate analgesic: Secondary | ICD-10-CM | POA: Diagnosis not present

## 2017-04-21 DIAGNOSIS — K449 Diaphragmatic hernia without obstruction or gangrene: Secondary | ICD-10-CM | POA: Diagnosis not present

## 2017-04-21 DIAGNOSIS — Z79899 Other long term (current) drug therapy: Secondary | ICD-10-CM | POA: Diagnosis not present

## 2017-04-21 DIAGNOSIS — Z431 Encounter for attention to gastrostomy: Secondary | ICD-10-CM | POA: Diagnosis not present

## 2017-04-21 DIAGNOSIS — I10 Essential (primary) hypertension: Secondary | ICD-10-CM | POA: Diagnosis not present

## 2017-04-24 DIAGNOSIS — I10 Essential (primary) hypertension: Secondary | ICD-10-CM | POA: Diagnosis not present

## 2017-04-24 DIAGNOSIS — K449 Diaphragmatic hernia without obstruction or gangrene: Secondary | ICD-10-CM | POA: Diagnosis not present

## 2017-04-24 DIAGNOSIS — K222 Esophageal obstruction: Secondary | ICD-10-CM | POA: Diagnosis not present

## 2017-04-24 DIAGNOSIS — K316 Fistula of stomach and duodenum: Secondary | ICD-10-CM | POA: Diagnosis not present

## 2017-04-24 DIAGNOSIS — Z79899 Other long term (current) drug therapy: Secondary | ICD-10-CM | POA: Diagnosis not present

## 2017-04-24 DIAGNOSIS — K9189 Other postprocedural complications and disorders of digestive system: Secondary | ICD-10-CM | POA: Diagnosis not present

## 2017-04-24 DIAGNOSIS — K219 Gastro-esophageal reflux disease without esophagitis: Secondary | ICD-10-CM | POA: Diagnosis not present

## 2017-04-24 DIAGNOSIS — Z431 Encounter for attention to gastrostomy: Secondary | ICD-10-CM | POA: Diagnosis not present

## 2017-04-24 DIAGNOSIS — Z79891 Long term (current) use of opiate analgesic: Secondary | ICD-10-CM | POA: Diagnosis not present

## 2017-04-26 DIAGNOSIS — Z431 Encounter for attention to gastrostomy: Secondary | ICD-10-CM | POA: Diagnosis not present

## 2017-04-26 DIAGNOSIS — K316 Fistula of stomach and duodenum: Secondary | ICD-10-CM | POA: Diagnosis not present

## 2017-04-26 DIAGNOSIS — Z79891 Long term (current) use of opiate analgesic: Secondary | ICD-10-CM | POA: Diagnosis not present

## 2017-04-26 DIAGNOSIS — K9189 Other postprocedural complications and disorders of digestive system: Secondary | ICD-10-CM | POA: Diagnosis not present

## 2017-04-26 DIAGNOSIS — K449 Diaphragmatic hernia without obstruction or gangrene: Secondary | ICD-10-CM | POA: Diagnosis not present

## 2017-04-26 DIAGNOSIS — I10 Essential (primary) hypertension: Secondary | ICD-10-CM | POA: Diagnosis not present

## 2017-04-26 DIAGNOSIS — S31109A Unspecified open wound of abdominal wall, unspecified quadrant without penetration into peritoneal cavity, initial encounter: Secondary | ICD-10-CM | POA: Diagnosis not present

## 2017-04-26 DIAGNOSIS — K222 Esophageal obstruction: Secondary | ICD-10-CM | POA: Diagnosis not present

## 2017-04-26 DIAGNOSIS — K219 Gastro-esophageal reflux disease without esophagitis: Secondary | ICD-10-CM | POA: Diagnosis not present

## 2017-04-26 DIAGNOSIS — Z79899 Other long term (current) drug therapy: Secondary | ICD-10-CM | POA: Diagnosis not present

## 2017-04-30 DIAGNOSIS — K3189 Other diseases of stomach and duodenum: Secondary | ICD-10-CM | POA: Diagnosis not present

## 2017-04-30 DIAGNOSIS — R112 Nausea with vomiting, unspecified: Secondary | ICD-10-CM | POA: Diagnosis not present

## 2017-04-30 DIAGNOSIS — K449 Diaphragmatic hernia without obstruction or gangrene: Secondary | ICD-10-CM | POA: Diagnosis not present

## 2017-04-30 DIAGNOSIS — K219 Gastro-esophageal reflux disease without esophagitis: Secondary | ICD-10-CM | POA: Diagnosis not present

## 2017-05-01 DIAGNOSIS — K222 Esophageal obstruction: Secondary | ICD-10-CM | POA: Diagnosis not present

## 2017-05-01 DIAGNOSIS — K219 Gastro-esophageal reflux disease without esophagitis: Secondary | ICD-10-CM | POA: Diagnosis not present

## 2017-05-01 DIAGNOSIS — Z431 Encounter for attention to gastrostomy: Secondary | ICD-10-CM | POA: Diagnosis not present

## 2017-05-01 DIAGNOSIS — Z79899 Other long term (current) drug therapy: Secondary | ICD-10-CM | POA: Diagnosis not present

## 2017-05-01 DIAGNOSIS — K9189 Other postprocedural complications and disorders of digestive system: Secondary | ICD-10-CM | POA: Diagnosis not present

## 2017-05-01 DIAGNOSIS — I10 Essential (primary) hypertension: Secondary | ICD-10-CM | POA: Diagnosis not present

## 2017-05-01 DIAGNOSIS — K316 Fistula of stomach and duodenum: Secondary | ICD-10-CM | POA: Diagnosis not present

## 2017-05-01 DIAGNOSIS — K449 Diaphragmatic hernia without obstruction or gangrene: Secondary | ICD-10-CM | POA: Diagnosis not present

## 2017-05-01 DIAGNOSIS — Z79891 Long term (current) use of opiate analgesic: Secondary | ICD-10-CM | POA: Diagnosis not present

## 2017-05-03 DIAGNOSIS — Z79891 Long term (current) use of opiate analgesic: Secondary | ICD-10-CM | POA: Diagnosis not present

## 2017-05-03 DIAGNOSIS — I10 Essential (primary) hypertension: Secondary | ICD-10-CM | POA: Diagnosis not present

## 2017-05-03 DIAGNOSIS — Z431 Encounter for attention to gastrostomy: Secondary | ICD-10-CM | POA: Diagnosis not present

## 2017-05-03 DIAGNOSIS — K219 Gastro-esophageal reflux disease without esophagitis: Secondary | ICD-10-CM | POA: Diagnosis not present

## 2017-05-03 DIAGNOSIS — K9189 Other postprocedural complications and disorders of digestive system: Secondary | ICD-10-CM | POA: Diagnosis not present

## 2017-05-03 DIAGNOSIS — K222 Esophageal obstruction: Secondary | ICD-10-CM | POA: Diagnosis not present

## 2017-05-03 DIAGNOSIS — Z79899 Other long term (current) drug therapy: Secondary | ICD-10-CM | POA: Diagnosis not present

## 2017-05-03 DIAGNOSIS — K449 Diaphragmatic hernia without obstruction or gangrene: Secondary | ICD-10-CM | POA: Diagnosis not present

## 2017-05-03 DIAGNOSIS — S31109A Unspecified open wound of abdominal wall, unspecified quadrant without penetration into peritoneal cavity, initial encounter: Secondary | ICD-10-CM | POA: Diagnosis not present

## 2017-05-03 DIAGNOSIS — K316 Fistula of stomach and duodenum: Secondary | ICD-10-CM | POA: Diagnosis not present

## 2017-05-09 DIAGNOSIS — Z431 Encounter for attention to gastrostomy: Secondary | ICD-10-CM | POA: Diagnosis not present

## 2017-05-09 DIAGNOSIS — Z79891 Long term (current) use of opiate analgesic: Secondary | ICD-10-CM | POA: Diagnosis not present

## 2017-05-09 DIAGNOSIS — Z79899 Other long term (current) drug therapy: Secondary | ICD-10-CM | POA: Diagnosis not present

## 2017-05-09 DIAGNOSIS — I10 Essential (primary) hypertension: Secondary | ICD-10-CM | POA: Diagnosis not present

## 2017-05-09 DIAGNOSIS — K449 Diaphragmatic hernia without obstruction or gangrene: Secondary | ICD-10-CM | POA: Diagnosis not present

## 2017-05-09 DIAGNOSIS — K316 Fistula of stomach and duodenum: Secondary | ICD-10-CM | POA: Diagnosis not present

## 2017-05-09 DIAGNOSIS — K222 Esophageal obstruction: Secondary | ICD-10-CM | POA: Diagnosis not present

## 2017-05-09 DIAGNOSIS — K9189 Other postprocedural complications and disorders of digestive system: Secondary | ICD-10-CM | POA: Diagnosis not present

## 2017-05-09 DIAGNOSIS — K219 Gastro-esophageal reflux disease without esophagitis: Secondary | ICD-10-CM | POA: Diagnosis not present

## 2017-05-11 DIAGNOSIS — S31109A Unspecified open wound of abdominal wall, unspecified quadrant without penetration into peritoneal cavity, initial encounter: Secondary | ICD-10-CM | POA: Diagnosis not present

## 2017-05-16 ENCOUNTER — Ambulatory Visit: Payer: Self-pay | Admitting: Surgery

## 2017-05-17 DIAGNOSIS — I10 Essential (primary) hypertension: Secondary | ICD-10-CM | POA: Diagnosis not present

## 2017-05-17 DIAGNOSIS — K219 Gastro-esophageal reflux disease without esophagitis: Secondary | ICD-10-CM | POA: Diagnosis not present

## 2017-05-17 DIAGNOSIS — Z431 Encounter for attention to gastrostomy: Secondary | ICD-10-CM | POA: Diagnosis not present

## 2017-05-17 DIAGNOSIS — K9189 Other postprocedural complications and disorders of digestive system: Secondary | ICD-10-CM | POA: Diagnosis not present

## 2017-05-17 DIAGNOSIS — K222 Esophageal obstruction: Secondary | ICD-10-CM | POA: Diagnosis not present

## 2017-05-17 DIAGNOSIS — Z79891 Long term (current) use of opiate analgesic: Secondary | ICD-10-CM | POA: Diagnosis not present

## 2017-05-17 DIAGNOSIS — K316 Fistula of stomach and duodenum: Secondary | ICD-10-CM | POA: Diagnosis not present

## 2017-05-17 DIAGNOSIS — Z79899 Other long term (current) drug therapy: Secondary | ICD-10-CM | POA: Diagnosis not present

## 2017-05-17 DIAGNOSIS — K449 Diaphragmatic hernia without obstruction or gangrene: Secondary | ICD-10-CM | POA: Diagnosis not present

## 2017-05-24 DIAGNOSIS — K9189 Other postprocedural complications and disorders of digestive system: Secondary | ICD-10-CM | POA: Diagnosis not present

## 2017-05-24 DIAGNOSIS — K449 Diaphragmatic hernia without obstruction or gangrene: Secondary | ICD-10-CM | POA: Diagnosis not present

## 2017-05-24 DIAGNOSIS — I10 Essential (primary) hypertension: Secondary | ICD-10-CM | POA: Diagnosis not present

## 2017-05-24 DIAGNOSIS — K316 Fistula of stomach and duodenum: Secondary | ICD-10-CM | POA: Diagnosis not present

## 2017-05-24 DIAGNOSIS — K219 Gastro-esophageal reflux disease without esophagitis: Secondary | ICD-10-CM | POA: Diagnosis not present

## 2017-05-24 DIAGNOSIS — Z79891 Long term (current) use of opiate analgesic: Secondary | ICD-10-CM | POA: Diagnosis not present

## 2017-05-24 DIAGNOSIS — Z79899 Other long term (current) drug therapy: Secondary | ICD-10-CM | POA: Diagnosis not present

## 2017-05-24 DIAGNOSIS — Z431 Encounter for attention to gastrostomy: Secondary | ICD-10-CM | POA: Diagnosis not present

## 2017-05-24 DIAGNOSIS — K222 Esophageal obstruction: Secondary | ICD-10-CM | POA: Diagnosis not present

## 2017-05-31 ENCOUNTER — Other Ambulatory Visit: Payer: Self-pay

## 2017-05-31 ENCOUNTER — Encounter (HOSPITAL_COMMUNITY): Payer: Self-pay | Admitting: Emergency Medicine

## 2017-06-13 NOTE — Anesthesia Preprocedure Evaluation (Addendum)
Anesthesia Evaluation  Patient identified by MRN, date of birth, ID band Patient awake    Reviewed: Allergy & Precautions, NPO status , Patient's Chart, lab work & pertinent test results, reviewed documented beta blocker date and time   Airway Mallampati: II  TM Distance: >3 FB Neck ROM: Full    Dental no notable dental hx. (+) Teeth Intact, Caps   Pulmonary neg pulmonary ROS,    Pulmonary exam normal breath sounds clear to auscultation       Cardiovascular Exercise Tolerance: Good hypertension, Pt. on medications Normal cardiovascular exam Rhythm:Regular Rate:Normal  EKG 09-27-16 on chart from [facility] care. 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   Endo/Other  negative endocrine ROS  Renal/GU negative Renal ROS  negative genitourinary   Musculoskeletal  (+) Arthritis , Osteoarthritis,    Abdominal (+) + obese,   Peds  Hematology  (+) anemia ,   Anesthesia Other Findings   Reproductive/Obstetrics                          Anesthesia Physical Anesthesia Plan  ASA: III  Anesthesia Plan: General   Post-op Pain Management:    Induction: Intravenous  PONV Risk Score and Plan: Ondansetron and Treatment may vary due to age or medical condition  Airway Management Planned: Oral ETT  Additional Equipment:   Intra-op Plan:   Post-operative Plan: Extubation in OR  Informed Consent: I have reviewed the patients History and Physical, chart, labs and discussed the procedure including the risks, benefits and alternatives for the proposed anesthesia with the patient or authorized representative who has indicated his/her understanding and acceptance.   Dental advisory given  Plan  Discussed with: Anesthesiologist, CRNA and Surgeon  Anesthesia Plan Comments: (May need Glidescope.)      Anesthesia Quick Evaluation

## 2017-06-14 ENCOUNTER — Ambulatory Visit (HOSPITAL_COMMUNITY): Payer: Medicare Other | Admitting: Anesthesiology

## 2017-06-14 ENCOUNTER — Encounter (HOSPITAL_COMMUNITY): Payer: Self-pay

## 2017-06-14 ENCOUNTER — Other Ambulatory Visit: Payer: Self-pay

## 2017-06-14 ENCOUNTER — Ambulatory Visit (HOSPITAL_COMMUNITY): Admit: 2017-06-14 | Payer: Medicare Other | Admitting: Surgery

## 2017-06-14 ENCOUNTER — Ambulatory Visit (HOSPITAL_COMMUNITY)
Admission: RE | Admit: 2017-06-14 | Discharge: 2017-06-14 | Disposition: A | Discharge status: Home / Self Care | Payer: Medicare Other | Source: Ambulatory Visit | Attending: Surgery | Admitting: Surgery

## 2017-06-14 ENCOUNTER — Encounter (HOSPITAL_COMMUNITY)
Admission: RE | Disposition: A | Discharge status: Home / Self Care | Payer: Self-pay | Source: Ambulatory Visit | Attending: Surgery

## 2017-06-14 DIAGNOSIS — K222 Esophageal obstruction: Secondary | ICD-10-CM | POA: Diagnosis not present

## 2017-06-14 DIAGNOSIS — E876 Hypokalemia: Secondary | ICD-10-CM | POA: Diagnosis not present

## 2017-06-14 DIAGNOSIS — Z931 Gastrostomy status: Secondary | ICD-10-CM | POA: Diagnosis not present

## 2017-06-14 DIAGNOSIS — E669 Obesity, unspecified: Secondary | ICD-10-CM | POA: Diagnosis present

## 2017-06-14 DIAGNOSIS — E440 Moderate protein-calorie malnutrition: Secondary | ICD-10-CM | POA: Insufficient documentation

## 2017-06-14 DIAGNOSIS — Z6827 Body mass index (BMI) 27.0-27.9, adult: Secondary | ICD-10-CM | POA: Diagnosis not present

## 2017-06-14 DIAGNOSIS — K449 Diaphragmatic hernia without obstruction or gangrene: Secondary | ICD-10-CM

## 2017-06-14 DIAGNOSIS — E441 Mild protein-calorie malnutrition: Secondary | ICD-10-CM | POA: Diagnosis present

## 2017-06-14 DIAGNOSIS — Z4659 Encounter for fitting and adjustment of other gastrointestinal appliance and device: Secondary | ICD-10-CM | POA: Diagnosis not present

## 2017-06-14 DIAGNOSIS — K440 Diaphragmatic hernia with obstruction, without gangrene: Secondary | ICD-10-CM | POA: Diagnosis not present

## 2017-06-14 DIAGNOSIS — K228 Other specified diseases of esophagus: Secondary | ICD-10-CM | POA: Diagnosis not present

## 2017-06-14 DIAGNOSIS — K219 Gastro-esophageal reflux disease without esophagitis: Secondary | ICD-10-CM | POA: Diagnosis not present

## 2017-06-14 DIAGNOSIS — Z9889 Other specified postprocedural states: Secondary | ICD-10-CM | POA: Insufficient documentation

## 2017-06-14 DIAGNOSIS — I10 Essential (primary) hypertension: Secondary | ICD-10-CM | POA: Diagnosis not present

## 2017-06-14 DIAGNOSIS — K2289 Other specified disease of esophagus: Secondary | ICD-10-CM

## 2017-06-14 HISTORY — DX: Other specified diseases of esophagus: K22.8

## 2017-06-14 HISTORY — PX: GASTROINTESTINAL STENT REMOVAL: SHX6384

## 2017-06-14 HISTORY — PX: BALLOON DILATION: SHX5330

## 2017-06-14 HISTORY — PX: SUBMUCOSAL INJECTION: SHX5543

## 2017-06-14 HISTORY — PX: ESOPHAGOGASTRODUODENOSCOPY (EGD) WITH PROPOFOL: SHX5813

## 2017-06-14 SURGERY — ESOPHAGOGASTRODUODENOSCOPY (EGD) WITH PROPOFOL
Anesthesia: General

## 2017-06-14 MED ORDER — FENTANYL CITRATE (PF) 100 MCG/2ML IJ SOLN
INTRAMUSCULAR | Status: AC
  Filled 2017-06-14: qty 2

## 2017-06-14 MED ORDER — ACETAMINOPHEN 500 MG PO TABS
1000.0000 mg | ORAL_TABLET | ORAL | Status: DC
  Filled 2017-06-14: qty 2

## 2017-06-14 MED ORDER — PROPOFOL 10 MG/ML IV BOLUS
INTRAVENOUS | Status: AC
  Filled 2017-06-14: qty 20

## 2017-06-14 MED ORDER — GABAPENTIN 300 MG PO CAPS
300.0000 mg | ORAL_CAPSULE | ORAL | Status: DC
  Filled 2017-06-14: qty 1

## 2017-06-14 MED ORDER — SUCCINYLCHOLINE CHLORIDE 20 MG/ML IJ SOLN
INTRAMUSCULAR | Status: DC | PRN
  Administered 2017-06-14: 80 mg via INTRAVENOUS

## 2017-06-14 MED ORDER — PROPOFOL 10 MG/ML IV BOLUS
INTRAVENOUS | Status: DC | PRN
  Administered 2017-06-14: 100 mg via INTRAVENOUS

## 2017-06-14 MED ORDER — SODIUM CHLORIDE 0.9 % IV SOLN
2.0000 g | INTRAVENOUS | Status: AC
  Administered 2017-06-14: 2 g via INTRAVENOUS
  Filled 2017-06-14: qty 2

## 2017-06-14 MED ORDER — SODIUM CHLORIDE 0.9 % IJ SOLN
INTRAMUSCULAR | Status: AC
  Filled 2017-06-14: qty 10

## 2017-06-14 MED ORDER — TRIAMCINOLONE ACETONIDE 40 MG/ML IJ SUSP
INTRAMUSCULAR | Status: AC
  Filled 2017-06-14: qty 1

## 2017-06-14 MED ORDER — FENTANYL CITRATE (PF) 100 MCG/2ML IJ SOLN
INTRAMUSCULAR | Status: DC | PRN
  Administered 2017-06-14: 50 ug via INTRAVENOUS

## 2017-06-14 MED ORDER — LIDOCAINE HCL (CARDIAC) 20 MG/ML IV SOLN
INTRAVENOUS | Status: DC | PRN
  Administered 2017-06-14: 40 mg via INTRAVENOUS

## 2017-06-14 MED ORDER — TRIAMCINOLONE ACETONIDE 40 MG/ML IJ SUSP
INTRAMUSCULAR | Status: DC | PRN
  Administered 2017-06-14: 4 mL via INTRAMUSCULAR

## 2017-06-14 MED ORDER — TRIAMCINOLONE ACETONIDE 40 MG/ML IJ SUSP: 40.0000 mg | INTRAMUSCULAR | Status: DC

## 2017-06-14 MED ORDER — LACTATED RINGERS IV SOLN
INTRAVENOUS | Status: DC
  Administered 2017-06-14: 1000 mL via INTRAVENOUS

## 2017-06-14 MED ORDER — LACTATED RINGERS IV SOLN
INTRAVENOUS | Status: DC
Start: 2017-06-14 — End: 2017-06-14

## 2017-06-14 MED ORDER — ONDANSETRON HCL 4 MG/2ML IJ SOLN
INTRAMUSCULAR | Status: DC | PRN
  Administered 2017-06-14: 4 mg via INTRAVENOUS

## 2017-06-14 MED ORDER — METRONIDAZOLE IN NACL 5-0.79 MG/ML-% IV SOLN
500.0000 mg | INTRAVENOUS | Status: AC
  Administered 2017-06-14: 500 mg via INTRAVENOUS
  Filled 2017-06-14: qty 100

## 2017-06-14 MED ORDER — PROPOFOL 10 MG/ML IV BOLUS
INTRAVENOUS | Status: AC
  Filled 2017-06-14: qty 40

## 2017-06-14 NOTE — Discharge Instructions (Signed)
Drink clear liquids tonight.  May advance to pured full liquids through the weekend.  If tolerating liquid diet okay, can resume solid diet on Monday.  Continue taking your antacid medication.  If you are feeling worse or have concerns, call sooner.  If you are feeling better, hopefully we can remove the tube out of your stomach in the office later this month.   Upper Endoscopy, Care After Refer to this sheet in the next few weeks. These instructions provide you with information about caring for yourself after your procedure. Your health care provider may also give you more specific instructions. Your treatment has been planned according to current medical practices, but problems sometimes occur. Call your health care provider if you have any problems or questions after your procedure. What can I expect after the procedure? After the procedure, it is common to have:  A sore throat.  Bloating.  Nausea.  Follow these instructions at home:  Follow instructions from your health care provider about what to eat or drink after your procedure.  Return to your normal activities as told by your health care provider. Ask your health care provider what activities are safe for you.  Take over-the-counter and prescription medicines only as told by your health care provider.  Do not drive for 24 hours if you received a sedative.  Keep all follow-up visits as told by your health care provider. This is important. Contact a health care provider if:  You have a sore throat that lasts longer than one day.  You have trouble swallowing. Get help right away if:  You have a fever.  You vomit blood or your vomit looks like coffee grounds.  You have bloody, black, or tarry stools.  You have a severe sore throat or you cannot swallow.  You have difficulty breathing.  You have severe pain in your chest or belly. This information is not intended to replace advice given to you by your health  care provider. Make sure you discuss any questions you have with your health care provider. Document Released: 08/29/2011 Document Revised: 08/05/2015 Document Reviewed: 12/10/2014 Elsevier Interactive Patient Education  2018 [employer]   EATING AFTER YOUR ESOPHAGEAL SURGERY (Stomach Fundoplication, Hiatal Hernia repair, Achalasia surgery, etc)  ######################################################################  EAT Start with a pureed / full liquid diet (see below) Gradually transition to a high fiber diet with a fiber supplement over the next month after discharge.    WALK Walk an hour a day.  Control your pain to do that.    CONTROL PAIN Control pain so that you can walk, sleep, tolerate sneezing/coughing, go up/down stairs.  HAVE A BOWEL MOVEMENT DAILY Keep your bowels regular to avoid problems.  OK to try a laxative to override constipation.  OK to use an antidairrheal to slow down diarrhea.  Call if not better after 2 tries  CALL IF YOU HAVE PROBLEMS/CONCERNS Call if you are still struggling despite following these instructions. Call if you have concerns not answered by these instructions  ######################################################################   After your esophageal surgery, expect some sticking with swallowing over the next 1-2 months.    If food sticks when you eat, it is called "dysphagia".  This is due to swelling around your esophagus at the wrap & hiatal diaphragm repair.  It will gradually ease off over the next few months.  To help you through this temporary phase, we start you out on a pureed (blenderized) diet.  Your first meal in the hospital was thin liquids.  You should have been given a pureed diet by the time you left the hospital.  We ask patients to stay on a pureed diet for the first 2-3 weeks to avoid anything getting "stuck" near your recent surgery.  Don't be alarmed if your ability to swallow doesn't progress according to this  plan.  Everyone is different and some diets can advance more or less quickly.     Some BASIC RULES to follow are:  Maintain an upright position whenever eating or drinking.  Take small bites - just a teaspoon size bite at a time.  Eat slowly.  It may also help to eat only one food at a time.  Consider nibbling through smaller, more frequent meals & avoid the urge to eat BIG meals  Do not push through feelings of fullness, nausea, or bloatedness  Do not mix solid foods and liquids in the same mouthful  Try not to "wash foods down" with large gulps of liquids.  Avoid carbonated (bubbly/fizzy) drinks.    Avoid foods that make you feel gassy or bloated.  Start with bland foods first.  Wait on trying greasy, fried, or spicy meals until you are tolerating more bland solids well.  Understand that it will be hard to burp and belch at first.  This gradually improves with time.  Expect to be more gassy/flatulent/bloated initially.  Walking will help your body manage it better.  Consider using medications for bloating that contain simethicone such as  Maalox or Gas-X   Eat in a relaxed atmosphere & minimize distractions.  Avoid talking while eating.    Do not use straws.  Following each meal, sit in an upright position (90 degree angle) for 60 to 90 minutes.  Going for a short walk can help as well  If food does stick, don't panic.  Try to relax and let the food pass on its own.  Sipping WARM LIQUID such as strong hot black tea can also help slide it down.   Be gradual in changes & use common sense:  -If you easily tolerating a certain "level" of foods, advance to the next level gradually -If you are having trouble swallowing a particular food, then avoid it.   -If food is sticking when you advance your diet, go back to thinner previous diet (the lower LEVEL) for 1-2 days.  LEVEL 1 = PUREED DIET  Do for the first 2 WEEKS AFTER SURGERY  -Foods in this group are pureed or  blenderized to a smooth, mashed potato-like consistency.  -If necessary, the pureed foods can keep their shape with the addition of a thickening agent.   -Meat should be pureed to a smooth, pasty consistency.  Hot broth or gravy may be added to the pureed meat, approximately 1 oz. of liquid per 3 oz. serving of meat. -CAUTION:  If any foods do not puree into a smooth consistency, swallowing will be more difficult.  (For example, nuts or seeds sometimes do not blend well.)  Hot Foods Cold Foods  Pureed scrambled eggs and cheese Pureed cottage cheese  Baby cereals Thickened juices and nectars  Thinned cooked cereals (no lumps) Thickened milk or eggnog  Pureed [place] toast or pancakes Ensure  Mashed potatoes Ice cream  Pureed parsley, au gratin, scalloped potatoes, candied sweet potatoes Fruit or [place] ice, sherbet  Pureed buttered or alfredo noodles Plain yogurt  Pureed vegetables (no corn or peas) Instant breakfast  Pureed soups and creamed soups Smooth pudding, mousse, custard  Pureed scalloped  apples Whipped gelatin  Gravies Sugar, syrup, honey, jelly  Sauces, cheese, tomato, barbecue, white, creamed Cream  Any baby food Creamer  Alcohol in moderation (not beer or champagne) Margarine  Coffee or tea Mayonnaise   Ketchup, mustard   Apple sauce   SAMPLE MENU:  PUREED DIET Breakfast Lunch Dinner   Orange juice, 1/2 cup  Cream of wheat, 1/2 cup  Pineapple juice, 1/2 cup  Pureed [place], barley soup, 3/4 cup  Pureed Hawaiian chicken, 3 oz   Scrambled eggs, mashed or blended with cheese, 1/2 cup  Tea or coffee, 1 cup   Whole milk, 1 cup   Non-dairy creamer, 2 Tbsp.  Mashed potatoes, 1/2 cup  Pureed cooled broccoli, 1/2 cup  Apple sauce, 1/2 cup  Coffee or tea  Mashed potatoes, 1/2 cup  Pureed spinach, 1/2 cup  Frozen yogurt, 1/2 cup  Tea or coffee      LEVEL 2 = SOFT DIET  After your first 2 weeks, you can advance to a soft diet.   Keep on this diet until  everything goes down easily.  Hot Foods Cold Foods  White fish Cottage cheese  Stuffed fish Junior baby fruit  Baby food meals Semi thickened juices  Minced soft cooked, scrambled, poached eggs nectars  Souffle & omelets Ripe mashed bananas  Cooked cereals Canned fruit, pineapple sauce, milk  potatoes Milkshake  Buttered or Alfredo noodles Custard  Cooked cooled vegetable Puddings, including tapioca  Sherbet Yogurt  Vegetable soup or alphabet soup Fruit ice, [place] ice  Gravies Whipped gelatin  Sugar, syrup, honey, jelly Junior baby desserts  Sauces:  Cheese, creamed, barbecue, tomato, white Cream  Coffee or tea Margarine   SAMPLE MENU:  LEVEL 2 Breakfast Lunch Dinner   Orange juice, 1/2 cup  Oatmeal, 1/2 cup  Scrambled eggs with cheese, 1/2 cup  Decaffeinated tea, 1 cup  Whole milk, 1 cup  Non-dairy creamer, 2 Tbsp  Pineapple juice, 1/2 cup  Minced beef, 3 oz  Gravy, 2 Tbsp  Mashed potatoes, 1/2 cup  Minced fresh broccoli, 1/2 cup  Applesauce, 1/2 cup  Coffee, 1 cup  [place], barley soup, 3/4 cup  Minced Hawaiian chicken, 3 oz  Mashed potatoes, 1/2 cup  Cooked spinach, 1/2 cup  Frozen yogurt, 1/2 cup  Non-dairy creamer, 2 Tbsp      LEVEL 3 = CHOPPED DIET  -After all the foods in level 2 (soft diet) are passing through well you should advance up to more chopped foods.  -It is still important to cut these foods into small pieces and eat slowly.  Hot Foods Cold Foods  Poultry Cottage cheese  Chopped Swedish meatballs Yogurt  Meat salads (ground or flaked meat) Milk  Flaked fish (tuna) Milkshakes  Poached or scrambled eggs Soft, cold, dry cereal  Souffles and omelets Fruit juices or nectars  Cooked cereals Chopped canned fruit  Chopped [place] toast or pancakes Canned fruit cocktail  Noodles or pasta (no rice) Pudding, mousse, custard  Cooked vegetables (no frozen peas, corn, or mixed vegetables) Green salad  Canned small sweet peas Ice cream    Creamed soup or vegetable soup Fruit ice, [place] ice  Pureed vegetable soup or alphabet soup Non-dairy creamer  Ground scalloped apples Margarine  Gravies Mayonnaise  Sauces:  Cheese, creamed, barbecue, tomato, white Ketchup  Coffee or tea Mustard   SAMPLE MENU:  LEVEL 3 Breakfast Lunch Dinner   Orange juice, 1/2 cup  Oatmeal, 1/2 cup  Scrambled eggs with cheese, 1/2 cup  Decaffeinated tea, 1 cup  Whole milk, 1 cup  Non-dairy creamer, 2 Tbsp  Ketchup, 1 Tbsp  Margarine, 1 tsp  Salt, 1/4 tsp  Sugar, 2 tsp  Pineapple juice, 1/2 cup  Ground beef, 3 oz  Gravy, 2 Tbsp  Mashed potatoes, 1/2 cup  Cooked spinach, 1/2 cup  Applesauce, 1/2 cup  Decaffeinated coffee  Whole milk  Non-dairy creamer, 2 Tbsp  Margarine, 1 tsp  Salt, 1/4 tsp  Pureed [place], barley soup, 3/4 cup  Barbecue chicken, 3 oz  Mashed potatoes, 1/2 cup  Ground fresh broccoli, 1/2 cup  Frozen yogurt, 1/2 cup  Decaffeinated tea, 1 cup  Non-dairy creamer, 2 Tbsp  Margarine, 1 tsp  Salt, 1/4 tsp  Sugar, 1 tsp    LEVEL 4:  REGULAR FOODS  -Foods in this group are soft, moist, regularly textured foods.   -This level includes meat and breads, which tend to be the hardest things to swallow.   -Eat very slowly, chew well and continue to avoid carbonated drinks. -most people are at this level in 4-6 weeks  Hot Foods Cold Foods  Baked fish or skinned Soft cheeses - cottage cheese  Souffles and omelets Cream cheese  Eggs Yogurt  Stuffed shells Milk  Spaghetti with meat sauce Milkshakes  Cooked cereal Cold dry cereals (no nuts, dried fruit, coconut)  [place] toast or pancakes Crackers  Buttered toast Fruit juices or nectars  Noodles or pasta (no rice) Canned fruit  Potatoes (all types) Ripe bananas  Soft, cooked vegetables (no corn, lima, or baked beans) Peeled, ripe, fresh fruit  Creamed soups or vegetable soup Cakes (no nuts, dried fruit, coconut)  Canned chicken noodle soup  Plain doughnuts  Gravies Ice cream  Bacon dressing Pudding, mousse, custard  Sauces:  Cheese, creamed, barbecue, tomato, white Fruit ice, [place] ice, sherbet  Decaffeinated tea or coffee Whipped gelatin  Pork chops Regular gelatin   Canned fruited gelatin molds   Sugar, syrup, honey, jam, jelly   Cream   Non-dairy   Margarine   Oil   Mayonnaise   Ketchup   Mustard   TROUBLESHOOTING IRREGULAR BOWELS  1) Avoid extremes of bowel movements (no bad constipation/diarrhea)  2) Miralax 17gm mixed in 8oz. water or juice-daily. May use BID as needed.  3) Gas-x,Phazyme, etc. as needed for gas & bloating.  4) Soft,bland diet. No spicy,greasy,fried foods.  5) Prilosec over-the-counter as needed  6) May hold gluten/wheat products from diet to see if symptoms improve.  7) May try probiotics (Align, Activa, etc) to help calm the bowels down  7) If symptoms become worse call back immediately.    If you have any questions please call our office at [facility]: [phone].

## 2017-06-14 NOTE — Transfer of Care (Signed)
Immediate Anesthesia Transfer of Care Note  Patient: Sydney [name]  Procedure(s) Performed: ESOPHAGOGASTRODUODENOSCOPY (EGD) WITH PROPOFOL (N/A ) GASTROINTESTINAL STENT REMOVAL (N/A ) BALLOON DILATION (N/A ) SUBMUCOSAL INJECTION (N/A )  Patient Location: PACU  Anesthesia Type:General  Level of Consciousness: awake, alert  and oriented  Airway & Oxygen Therapy: Patient Spontanous Breathing and Patient connected to face mask oxygen  Post-op Assessment: Report given to RN and Post -op Vital signs reviewed and stable  Post vital signs: Reviewed and stable  Last Vitals:  Vitals Value Taken Time  BP    Temp    Pulse    Resp    SpO2      Last Pain:  Vitals:   06/14/17 0819  TempSrc: Oral  PainSc: 0-No pain         Complications: No apparent anesthesia complications

## 2017-06-14 NOTE — Op Note (Addendum)
06/14/2017  10:47 AM  PATIENT:  Sydney [name]  72 y.o. 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  See anesthesia record  Delay start of Pharmacological VTE agent (>24hrs) due to surgical blood loss or risk of bleeding:  no  DRAINS: none   SPECIMEN:  Wallflex covered nitinol esophageal stent (not sent)  DISPOSITION OF SPECIMEN:  N/A  COUNTS:  YES  PLAN OF CARE: Discharge to home after PACU  PATIENT DISPOSITION:  PACU - hemodynamically stable.  INDICATION: Pleasant woman with giant hiatal hernia status post robotic repair and fundoplication in November.  Developed delayed stricturing and internal esophagogastric fistulization.  Underwent dilation and steroid injection and covered stenting.  Is been a feeding gastrostomy tube.  04/13/2017.  Since then her dysphagia has improved markedly.  Tolerating soft foods.  Appetite and energy level improving.  Walking better.  Based on improvements, felt it was reasonable to go ahead and remove the stenting for reevaluation and possible repeat balloon dilatation.  Techniques risks benefits alternatives discussed.  Risk of perforation, hospitalization, repeat interventions, etc, were discussed.  Numerous other risks were discussed in detail.  Patient and her husband agreed to proceed.  OR FINDINGS:  Long covered esophageal Wallflex stent in place.  Some irritation.  Able to be removed safely.  Exposed Ethibond and Phasix mesh posterior esophagogastric junction but no active leak.  Stricture markedly improved to be more mild as opposed to severe stricturing at the time of esophageal stenting.  Mild laxity at Nissen fundoplication - Type 1b valve with some laxity.  Mild stricturing.  Gastrostomy tube in place without irritation or ulceration.  No gastritis.  No retained food.  No pyloric stricture.  No gastric or duodenal ulceration or stricturing.  Balloon dilatation done 12-13 0.5/15.  15-16.5-18.   DESCRIPTION: Informed consent was confirmed.  Patient underwent general anesthesia without difficulty.  She was positioned left side down decubitus.  Oral bite block was in place.  Timeout confirmed our plan.  I placed the endoscope transorally down to the esophagus.  Proximal to mid esophagus mildly dilated but clean.  Came to the obvious stent.  Somewhat incorporated proximally.  Was able to come across the stent easily into the stomach.  Some evidence of some old blood consistent with irritation at the stent site.  Ggastrostomy tube site clean.  Was able to continue down the normal antrum, pylorus and duodenal bulb down to the third part of the duodenum without any abnormality.  Used a grasper to grasp the proximal stent and remove it out transorally intact.  Did inspection.  There is some raw oozing on the esophagus but no major bleeding.  I could pass the scope and did retroflexion view.  Fundoplication was intact.  Some mild laxity.  Not a classic type IA valve.  Perhaps more like 1B today.  Still most of it intact with a rather snug omega.  I did not see any leaking or inflammation or other  normality.  Pulling back there was an exposed Ethibond suture on the posterior esophagogastric junction.  A corner of mesh was exposed.  This is absorbable Phasix mesh.  Made attempt to grasp the suture and  remove it but it was intact.  I left it in place.  I did balloon dilatation across the esophagogastric junction and the fundoplication.  12-13.5-15 easily inflated.  The wrap was allowing a balloon much easily now.  Transition to 15-16.5-18.  Held up to 18 for 2 minutes.  B[place]on collapsed & dilator removed.  Inspection revealed no injury nor abnormalities.  I injected at the esophagogastric junction with Kenalog 40 diluted up to 4 mL.  1 mL per each quadrant.  I did irrigation & washed off some old clot where the esophageal stent had been in the distal esophagus.  No active bleeding or abnormalities.  Inspection of the stomach clean as well.  No leaking or other abnormalities.  Gas aspirated.  Scope removed.  She was extubated and in the recovery room in stable condition.  No evidence of any tachycardia or other abnormalities.  No complaints of abdominal pain.  I discussed intraoperative findings with the patient's husband and family.  Gave discussion about advancing diet from liquid to soft foods gradually over the next week.  Hopefully we can move remove the gastrostomy tube in a few weeks if she continues to improve by tolerating a solid diet, adequate caloric and nutritional intake, stable weight, a be able to swallow pills etc.  [name], M.D., F.A.C.S. Gastrointestinal and Minimally Invasive Surgery [facility] [address] [place], [place] [address] [phone] Main / Paging

## 2017-06-14 NOTE — Interval H&P Note (Signed)
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 After consideration of risks, benefits and other options for treatment, the patient has consented to  Procedure(s) with comments: ESOPHAGOGASTRODUODENOSCOPY (EGD) WITH PROPOFOL (N/A) GASTROINTESTINAL STENT REMOVAL (N/A) - esopheageal stent removal BALLOON DILATION (N/A) SUBMUCOSAL INJECTION (N/A) - STEROID INJECTION as a surgical intervention .  The patient's history has been reviewed, patient examined, no change in status, stable for surgery.  I have reviewed the patient's chart and labs.  Questions were answered to the patient's satisfaction.     [name]

## 2017-06-14 NOTE — Anesthesia Postprocedure Evaluation (Signed)
Anesthesia Post Note  Patient: JOHNEISHA BROADEN  Procedure(s) Performed: ESOPHAGOGASTRODUODENOSCOPY (EGD) WITH PROPOFOL (N/A ) GASTROINTESTINAL STENT REMOVAL (N/A ) BALLOON DILATION (N/A ) SUBMUCOSAL INJECTION (N/A )     Patient location during evaluation: PACU Anesthesia Type: General Level of consciousness: awake and alert and oriented Pain management: pain level controlled Vital Signs Assessment: post-procedure vital signs reviewed and stable Respiratory status: spontaneous breathing, nonlabored ventilation and respiratory function stable Cardiovascular status: blood pressure returned to baseline and stable Postop Assessment: no apparent nausea or vomiting Anesthetic complications: no    Last Vitals:  Vitals:   06/14/17 1055 06/14/17 1100  BP: (!) 111/45   Pulse:    Resp:    Temp: 36.8 C   SpO2: 100% 100%    Last Pain:  Vitals:   06/14/17 1055  TempSrc: Oral  PainSc:                  Maryiah Olvey A.

## 2017-06-14 NOTE — Anesthesia Procedure Notes (Signed)
Procedure Name: Intubation Date/Time: 06/14/2017 9:58 AM Performed by: Thornell Mule, CRNA Pre-anesthesia Checklist: Patient identified, Emergency Drugs available, Suction available and Patient being monitored Patient Re-evaluated:Patient Re-evaluated prior to induction Oxygen Delivery Method: Circle system utilized Preoxygenation: Pre-oxygenation with 100% oxygen Induction Type: IV induction Ventilation: Mask ventilation without difficulty Laryngoscope Size: Miller and 3 Grade View: Grade I Tube type: Oral Tube size: 7.0 mm Number of attempts: 1 Airway Equipment and Method: Stylet and Oral airway Placement Confirmation: ETT inserted through vocal cords under direct vision,  positive ETCO2 and breath sounds checked- equal and bilateral Secured at: 20 cm Tube secured with: Tape Dental Injury: Teeth and Oropharynx as per pre-operative assessment

## 2017-06-14 NOTE — H&P (Signed)
Sydney [name] DOB: [birth year]  Patient Care Team: Selinda Flavin, MD as PCP - General (Family Medicine) Karie Soda, MD as Consulting Physician (General Surgery) Myrtie Neither Andreas Blower, MD as Consulting Physician (Gastroenterology)  Marland Kitchen The patient returns s/p robotic paraesophageal incarcerated hiatal hernia repair and fundoplication 01/31/2017  Laparoscopic and endoscopic removal of foreign body suture/pledget, balloon dilatation, Kenalog 40 steroid injection, covered stenting. Gastrostomy tube placement. 04/13/2017  Patient returns with her husband. She feels like she is getting better. Her nausea is much less. She still is constipated. Doing MiraLAX once a day. Had some moderate to severe heartburn at times. However her appetite is coming back. She is tolerating liquids fine. Actually enjoying being able to eat some soft foods such as spaghetti and some casseroles. She still putting her medications through the gastrostomy tube. She gets nauseated with eggs. Mainly she just doesn't feel like eating them. Normally she loves eating them. Complains of a small rash along her left rib cage in the region of her gastrostomy tube but not right at it. She is having at least 2 cans of supplemental shakes through her gastrostomy tube. She would like to get rid of the tube at some point. She's getting out of the house now. Usually about half the week she is out and about. Not her usual 7 days. She has not been doing much the way walking well. Its tired. But she does feel like her energy is slowly coming back.    ` ` ` Physician Discharge Summary Patient ID: Sydney [name] MRN: [id number] DOB/AGE: [birth year] 72 y.o.  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 04/13/2017 Active Problems: Paraesophageal hiatal hernia s/p repair 01/31/2017 Gastroesophageal reflux disease Obesity Hypertension Failure to thrive (0-17) Esophageal obstruction - partial Hypokalemia Nausea & vomiting Malnutrition of moderate degree Gastrostomy tube in place [facility]) Hypomagnesemia Hypophosphatemia   7 Days Post-Op 04/13/2017  POST-OPERATIVE DIAGNOSIS:  GERD  SURGERY: 04/13/2017  Procedure(s): Laproscopic lysis of Adhesions ESOPHAGOGASTRODUODENOSCOPY (EGD) WITH BALLOON DILATION LAPAROSCOPIC GASTRIC EXPLORATION GASTROSTOMY TUBE PLACEMENT ESOPHAGEAL STENT PLACEMENT EXCISION OF FOREIGN BODY X ESOPHAGUS STEROID INJECTION ESOPHAGUS  SURGEON:   Surgeon(s): Karie Soda, MD  Consults: GI  Hospital Course:   Patient with giant paraesophageal hiatal hernia with 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 Underwent robotic takedown and primary repair with absorbable mesh reinforcement and fundoplication. Struggled with some dysphasia that improved initially on steroids. Then returned with inability to keep anything down.  She was admitted. Swallow study showed extremely tight esophageal stricture with side passage suspicious for lateral esophagogastric fistula to the side wrap. Gastroenterology consultation made for endoscopic dilation. Exposed sutures and pledgets noted. Attempt aborted. I took her to the OR arm. I removed pledgets and sutures endoscopically. Did balloon dilation up to 15 mm. Placed a covered esophageal stent. Placed a gastrostomy tube.  Postoperatively, the patient's nausea bloating and pain were controlled. On tube feeds. She had a swallow study that showed the cover stent sealing off the side leak and showing much improved dilation of the esophagus.  By the time of discharge  she was tolerating a pured diet somewhat. Transition to her to a few occasional bolus feeds through the gastrostomy tube. Training was given to  patient and her husband. Home health is being set up for gastrostomy tube care and home feeding. Patient's ileus resolved. She had significant hypomagnesemia, hypophosphatemia, hypokalemia. These were aggressively treated and corrected. Compliance insured with acid blockade with proton pump inhibitor as well as H2 blockade.  Based upon improvements and stabilization, I felt it was reasonable for her to be discharged home with home health care and tube feeds. Close follow-up. Hopefully eventually we can gradually dilate the wrap back up in the leak will seal the gastrostomy tube can be removed. Otherwise, she may require repeat operation. But would like to hold off until the inflammatory process has abated in a few months. Hopefully it will not come to that. Based on meeting discharge criteria and continuing to recover, I felt it was safe for the patient to be discharged from the hospital to further recover with close followup. Postoperative recommendations were discussed in detail. They are written as well.  Discharged Condition: good  Disposition:  Follow-up Information Karie Soda, MD. Go on 04/30/2017. Specialty: General Surgery Contact information: [address] [place] [address] [phone]     Marland Kitchen  01/31/2017  12:36 PM  PATIENT: Sydney [name] 72 y.o. female  Patient Care Team: Selinda Flavin, MD as PCP - General (Family Medicine) Karie Soda, MD as Consulting Physician (General Surgery) Danis, Andreas Blower, MD as Consulting Physician (Gastroenterology)  PRE-OPERATIVE DIAGNOSIS: PARAESOPHAGEAL HIATAL HERNIA WITH ORGANOAXIAL VOLVULUS, REFRACTORY TO MEDICAL MANAGEMENT  POST-OPERATIVE DIAGNOSIS: PARAESOPHAGEAL HIATAL HERNIA WITH ORGANOAXIAL VOLVULUS, REFRACTORY TO MEDICAL MANAGEMENT  PROCEDURE:    1. Robotic Laparoscopic reduction of paraesophageal hiatal hernia 2. Type II mediastinal dissection. 3. Primary repair of hiatal hernia over pledgets. 3.5 Mesh reinforcement (Phasix) of hiatal hernia repair 4. Anterior & posterior gastropexy. 5. Nissen fundoplication 2 cm over a 56-French bougie  SURGEON: [name], MD  ASSIST: Marin Olp, MD  ANESTHESIA: local and general  EBL: Total I/O In: 1000 [I.V.:1000] Out: 50 [Blood:50]  Delay start of Pharmacological VTE agent (>24hrs) due to surgical blood loss or risk of bleeding: no  ANESTHESIA: 1. General anesthesia. 2. Local anesthetic in a field block around all port sites.  SPECIMEN: Mediastinal hernia sac (not sent).  DRAINS: A 19-French Blake drain goes from the right upper quadrant along the lesser curvature of the stomach into the mediastinum.  COUNTS: YES  PLAN OF CARE: Admit to inpatient  PATIENT DISPOSITION: PACU - hemodynamically stable.  INDICATION:  Patient with symptomatic paraesophageal hiatal hernia. Organoaxial volvulus rotation. No delayed gastric emptying. Manometry with no atypical peristalsis. The patient has had extensive work-up & we feel the patient will benefit from repair:  The anatomy & physiology of the foregut and anti-reflux mechanism was discussed. The pathophysiology of hiatal herniation and GERD was discussed. Natural history risks without surgery was discussed. The patient's symptoms are not adequately controlled by medicines and other non-operative treatments. I feel the risks of no intervention will lead to serious problems that outweigh the operative risks; therefore, I recommended surgery to reduce the hiatal hernia out of the chest and fundoplication to rebuild the anti-reflux valve and control reflux better. Need for a thorough workup to rule out the differential diagnosis and plan treatment was explained. I explained laparoscopic techniques with possible  need for an open approach.  Risks such as bleeding, infection, abscess, leak, need for further treatment, heart attack, death, and other risks were discussed. I noted a good likelihood this will help address the problem.  Goals of post-operative recovery were discussed as well. Possibility that this will not correct all symptoms was explained. Post-operative dysphagia, need for short-term liquid & pureed diet, inability to vomit, possibility of reherniation, possible need for medicines to help control symptoms in addition to surgery were discussed. We will work to minimize complications. Educational handouts further explaining the pathology, treatment options, and dysphagia diet was given as well. Questions were answered. The patient expresses understanding & wishes to proceed with surgery.  OR FINDINGS:  Moderate-sized paraesophageal hiatal hernia with 100% of the stomach and duodenal bulb in the mediastinum. There was a 10 x 10 cm hiatal defect.  It is a primary repair over pledgets.  Mesh was used to reinforce the primary repair over pledgets. 15 x 10 cm Phasix Mesh (a knitted monofilament mesh scaffold using Poly-4-hydroxybutyrate (P4HB), a biologically derived, fully resorbable material)  The patient has a 2 cm Nissen fundoplication that was done over 56-French bougie. The patient has had anterior and posterior gastropexies.  DESCRIPTION:  Informed consent was confirmed. The patient received IV antibiotics prior to incision. The underwent general anesthesia without difficulty. A Foley catheter sterilely placed. The patient was positioned in split leg with arms tucked. The abdomen was prepped and draped in the sterile fashion. Surgical time-out confirmed our plan.  I induced capnoperitoneum using a Varess technique. Placed to a millimeter port in the anterior axillary line in the left subcostal region Camera inspection revealed no injury. Under direct visualization, I  placed additional ports in the abdomen including assist port in the right lower quadrant. I also placed a 5 mm port in the left subxiphoid region under direct visualization. I removed that and placed an Omega-shaped rigid Nathanson liver retractor to lift the left lateral sector of the liver anteriorly to expose the esophageal hiatus. Fortunately it was relatively small. This was secured to the bed using the iron man system. The Xi robot was carefully docked and positioned.  I confirmed a very large hiatal hernia with all the stomach and the duodenal bulb incarcerated up in the mediastinum. It would not spontaneously reduced down. Therefore decided to dissect the hernia sac off the mediastinum to help reduce the contents. I started in the right anterior crus to grasp the anterior mediastinal sac at the apex of the crus. I scored through that and got into the anterior mediastinum. I was able to free the mediastinal sac from its attachments to the pericardium and bilateral pleura using primarily focused gentle blunt dissection as well as focused ultrasonic Harmonic dissection. I transected phrenoesophageal attachments to the inner right crus, preserving a two centimeter cuff of mediastinal sac until I found the base of the crura. I then came around anteriorly on the left side and freed up the phrenoesophageal attachments of the mediastinal sac on the medial part of the left crus on the superior half. I did careful mediastinal dissection to free the mediastinal sac. It was quite large especially in the right mediastinum. With that, we could relieve the suction cup affect of the hernia sac and help reduce the stomach back down into the abdomen, flipped back approriately.   We ligated the short gastrics along the lesser curvature of the stomach about a third way down and then came up proximally over the fundus. We released the attachments of the stomach to the retroperitoneum until we were able to  connect with the prior dissection on the left crus. We completed the release of phrenoesophageal attachments to the medial part of  the left crus down to its base. With this, we had circumferential mobilization. I had to do further dissection to ultimately free off adhesions of omentum and thick hernia sac at the base of both crura. I did release the liver attachments to the anterior diaphragm and right crus to help release some tension. Also did a relaxing incision on the left diaphragm parallel to the left crus. This helped the hiatus seem to come together which much less tension. I did have breech of the thinned out left diaphragm into the left pleural cavity with a clean opening. This was later closed with a horizontal mattress suture & patched / covered by the mesh (see below).  We placed the stomach and esophagus on axial tension. I then did a Type II mediastinal dissection where I freed the esophagus from its attachments to the aorta, spine, pleura, and pericardium using primarily gentle blunt as well as focused bipolar vessel sealer dissection. We saw the anterior & posterior vagus nerves worked to keep them intact. I procedded to dissect about 25 cm proximally into the mediastinum. Came up close to the azygos vein definitely in the mid mediastinum. With that eventually I could free out the esophagus and get much more length. With that I could straighten out the esophagus and get 4 cm of intra-abdominal length of the esophagus at a best estimation.  I freed the anterior mediastinal sac off the esophagus & stomach. Carefully skeletonized. We saw the anterior vagus nerve and freed the sac off of the vagus. Greater omentum was rather adherent to the posterior hernia sac, so this took some time to carefully free off. I dissected out & removed the fatty epiphrenic pads at the esophagogastric junction. With that, I could better define the esophagogastric junction. I confirmed the the patient  had 4 cm of intra-abdominal esophageal length off tension. After inspection the anterior vagus nerves was then brought intact. Posterior vagus was more wispy in the mid mediastinum but again intact.  I brought the fundus of the stomach posterior to the esophagus over to the right side. The wrap was mobile with the classic shoeshine maneuver. Wrap became together gently. We reflected the stomach left laterally and closed the esophageal hiatus using 0 Ethibond stitch using horizontal mattress stitches with pledgets on both sides. I did that x3 stitches. The crura had good substance and they came together well without any tension.  He also did a reinforcement mesh repair using Phasix mesh. 15 x 10 cm sheet. Cut a conical defect to form a broad U. 5 cm right, 10 cm left side. I rolled that mesh in. I positioned it on top of the crural closure. The more narrow tail overlying the right crura. The broader tail between the diaphragm and spleen. Cured that to the diaphragm using interrupted Ethibond sutures. I closed the small left diaphragmatic releasing incision defect with a 0 Ethibond horizontal mattress suture and out the mesh to help reinforce the closure. I made sure that there was a gap anteriorly to avoid circumferential tightening or stricturing.  I reset up the and Nissen wrap and did a posterior gastropexy by taking of 0 Ethibond stitches x 3 to the posterior part of the right side of the wrap and thru the crural closure and tied that down for good posterior gastropexy up against the hiatal closure. I then did a left anterior gastropexy taking the apex of the left side of the wrap to the left anterior crura just posterior to the phrenic  vein. In a similar suture in a mirror image fashion on the right side for the right posterior part of the stomach reactive to the anterior right corona. I left the stitches untied.  Next, Anesthesia passed a 56-French bougie transorally. It was a  lighted bougie and we did do this under axial tension. It passed down easily without resistance. I tied the anterior gastropexy sutures down. Because she had normal esophageal motility by manometry, I decided to have a good antireflux valve reconstruction with a classic 2 cm Nissen fundoplication on the true esophagus above the cardia using Ethibond stitch in the left side of the wrap, then anterior esophagus, and then right side of the wrap and tied that down. Did 3 stitches. I measured it and it was 2 cm in length. We removed the bougie. It was intact.   The wrap was soft and floppy. I placed a drain as noted above. I did irrigation and ensured hemostasis. I saw no evidence of any leak or perforation or other abnormality. I removed the [facility] liver retractor under direct visualization. I evacuated carbon dioxide and removed the ports. Counts were correct on the needles, sponges, and instruments. The skin was closed with Monocryl and sterile dressings applied. The largest port was along the left subcostal region and slid up above the rib cage, so I did not feel it needed more aggressive closure.  The patient is being extubated and brought back to the recovery room. I discussed postop care in detail with the patient and family in in the office. Discussed again with the patient in the holding area. I discussed operative findings, updated the patient's status, discussed probable steps to recovery, and gave postoperative recommendations to the patient's family. Recommendations were made. Questions were answered. They expressed understanding & appreciation.  [name], M.D., F.A.C.S. Gastrointestinal and Minimally Invasive Surgery [facility] 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 Cataract Surgery  Bilateral. Gallbladder Surgery - Laparoscopic   Diagnostic Studies History [name], MD; 05/16/2017 9:03 AM) Colonoscopy  5-10 years ago Mammogram  1-3 years ago Pap Smear  >5 years ago  Allergies Sander Nephew, CMA; 05/16/2017 9:01 AM) No Known Drug Allergies [05/16/2017]: Allergies Reconciled   Medication History Sander Nephew, CMA; 05/16/2017 9:01 AM) Gloriajean Dell (25MG  Suppository, Rectal) Active. Omeprazole (20MG  Capsule DR, Oral) Active. Protonix (40MG  Tablet DR, 1 (one) Oral daily, Taken starting 04/23/2017) Active. Ondansetron (4MG  Tablet Disint, 1 (one) Oral three times daily, as needed, Taken starting 04/30/2017) Active. Metoclopramide HCl (5MG  Tablet, 1 (one) Oral every six hours, as needed, Taken starting 05/02/2017) Active. (for breakthrough nausea) Chlorthalidone (25MG  Tablet, Oral) Active. RaNITidine HCl (300MG  Tablet, Oral) Active. Medications Reconciled  Social History [name], MD; 05/16/2017 9:03 AM) Caffeine use  Carbonated beverages, Tea. No alcohol use  No drug use  Tobacco use  Never smoker.  Family History [name], MD; 05/16/2017 9:03 AM) Arthritis  Mother, Sister. Heart Disease  Father, Mother. Hypertension  Mother. Thyroid problems  Daughter, Mother.  Pregnancy / Birth History [name], MD; 05/16/2017 9:03 AM) Age at menarche  11 years. Age of menopause  41-50 Gravida  3 Maternal age  75-20 Para  2  Other Problems [name], MD; 05/16/2017 9:03 AM) Arthritis  Back Pain  Cholelithiasis  Gastroesophageal Reflux Disease  High blood pressure  Thyroid Disease   Vitals (Danielle Gerrigner CMA; 05/16/2017 9:01 AM) 05/16/2017 9:00  AM Weight: 129.38 lb Height: 60in Body Surface Area: 1.55 m Body Mass Index: 25.27 kg/m  Temp.: 98.58F(Oral)  Pulse: 129 (Regular)  BP: 118/80 (Sitting, Left Arm, Standard)  BP (!) 134/52    Pulse 100    Temp 97.9 F (36.6 C) (Oral)    Resp 14    Ht 4\' 10"  (1.473 m)    Wt 59 kg (130 lb)    SpO2 100%    BMI 27.17 kg/m       Physical Exam [name] MD; 05/16/2017 9:22 AM) General Mental Status-Alert. General Appearance-Not in acute distress. Voice-Normal. Note: Relaxed. Nontoxic. Moves around slowly but steadily. Improved   Integumentary Global Assessment Normal Exam - Distribution of scalp and body hair is normal. General Characteristics Overall Skin Surface - no rashes and no suspicious lesions.  Head and Neck Head-normocephalic, atraumatic with no lesions or palpable masses. Face Global Assessment - atraumatic, no absence of expression. Neck Global Assessment - no abnormal movements, no decreased range of motion. Trachea-midline. Thyroid Gland Characteristics - non-tender.  Eye Eyeball - Left-Extraocular movements intact, No Nystagmus. Eyeball - Right-Extraocular movements intact, No Nystagmus. Upper Eyelid - Left-No Cyanotic. Upper Eyelid - Right-No Cyanotic.  Chest and Lung Exam Inspection Accessory muscles - No use of accessory muscles in breathing.  Abdomen Note: ` ` ` Left upper quadrant gastrostomy tube site rather clean. No major drainage. Mild rash along the left subcostal ridge. Probably fungal.  Abdomen soft and flat. Nontender. Nondistended. Incisions with normal healing ridges. No active bleeding. No cellulitis. No guarding/rebound tenderness   Peripheral Vascular Upper Extremity Inspection - Left - Not Gangrenous, No Petechiae. Right - Not Gangrenous, No Petechiae.  Neurologic Neurologic evaluation reveals -normal attention span and ability to concentrate, able to name objects and repeat  phrases. Appropriate fund of knowledge and normal coordination.  Neuropsychiatric Mental status exam performed with findings of-able to articulate well with normal speech/language, rate, volume and coherence and no evidence of hallucinations, delusions, obsessions or homicidal/suicidal ideation. Orientation-oriented X3. Note: Less worried. More optimistic. Smiled a few times.   Musculoskeletal Global Assessment Gait and Station - normal gait and station.  Lymphatic General Lymphatics Description - No Generalized lymphadenopathy.    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 Eroded suture and pledget removed. The wrap at the esophagogastric junction was dilated, injected w Kenalog, and stented. No evidence of persistent leak.  Hopefully over time with the help of the PPI & improved nutrition, this will seal.  Because she is feeling better and tolerating soft foods, it is time to remove the stent. Usually around a 6-8 week period from the initial placement. That would be later this month. Most likely at the time we'll do repeated balloon of the distal esophagus wrap and stricture. Repeat Kenalog injection.  I did caution her that in the future she is likely to stricture down & need repeated dilations. Hopefully will be less likely with time. We will see.  Continue proton pump inhibitor for at least the next 3 months, perhaps indefinitely to minimize reflux,  stricturing, and dyspgaia symptoms.  Continue gastrostomy tube until she is eating normally for at least a few weeks after everything. Hopefully in the next 6-12 weeks. The fact that she does not want to drink any of the supplemental shakes makes me hesitant to remove it. Her malnutrition has made this a longer recovery period. With full nutrition between eating and  gastrostomy tube supplemental shakes, she is finally getting better.  Control her pain better.  Control her constipation better as I think that's making her nausea and heartburn worse. Double MiraLAX. Perhaps triple it.  Improved nausea control. Follow  I strongly recommend that she walk an hour a day. She feels tired but is actually leaving the house about every other day which was better than her homebound status for the first several weeks. I noted that increased physical activity will help her more quickly improve. Which means I can get the gastrostomy tube out sooner. Otherwise it is a greater challenge to be sedentary. Current Plans The anatomy and the physiology was discussed. The pathophysiology and natural history of the disease was discussed. Options were discussed and recommendations were made. Technique, risks, benefits, & alternatives were discussed. Risks such as stroke, heart attack, bleeding, indection, death, and other risks discussed. Questions answered. The patient agrees to proceed. You are being scheduled for surgery- Our schedulers will call you.  You should hear from our office's scheduling department within 5 working days about the location, date, and time of surgery. We try to make accommodations for patient's preferences in scheduling surgery, but sometimes the OR schedule or the surgeon's schedule prevents [place] from making those accommodations.  If you have not heard from our office [phone]) in 5 working days, call the office and ask for your surgeon's nurse.  If you have other questions about your diagnosis, plan, or surgery, call the office and ask for your surgeon's nurse.  NAUSEA AND VOMITING IN ADULT (R11.2) Impression: Nausea less.  Correct constipation to help reverse this.  Improved appetite and tolerates a soft food a hopeful sign.  Nausea medicines as needed. 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 FUNGAL RASH OF TORSO (B36.9) Impression: Mild but definite rash along the left subcostal ridge. Most likely fungal.  Fluconazole by mouth 1.  Over-the-counter topical antifungals as needed. Current Plans Started Fluconazole 200 MG Oral Tablet, 1 (one) Tablet daily, #3, 05/16/2017, Ref. Andres Labrum, M.D., F.A.C.S. Gastrointestinal and Minimally Invasive Surgery [facility] [address] [place], [place] [address] [phone] Main / Paging

## 2017-06-15 ENCOUNTER — Encounter (HOSPITAL_COMMUNITY): Payer: Self-pay | Admitting: Surgery

## 2017-07-24 DIAGNOSIS — M069 Rheumatoid arthritis, unspecified: Secondary | ICD-10-CM | POA: Diagnosis not present

## 2017-07-24 DIAGNOSIS — Z6824 Body mass index (BMI) 24.0-24.9, adult: Secondary | ICD-10-CM | POA: Diagnosis not present

## 2017-07-24 DIAGNOSIS — E041 Nontoxic single thyroid nodule: Secondary | ICD-10-CM | POA: Diagnosis not present

## 2017-07-24 DIAGNOSIS — K440 Diaphragmatic hernia with obstruction, without gangrene: Secondary | ICD-10-CM | POA: Diagnosis not present

## 2017-07-27 DIAGNOSIS — E042 Nontoxic multinodular goiter: Secondary | ICD-10-CM | POA: Diagnosis not present

## 2017-08-27 ENCOUNTER — Other Ambulatory Visit: Payer: Self-pay | Admitting: Otolaryngology

## 2017-08-27 ENCOUNTER — Other Ambulatory Visit (HOSPITAL_COMMUNITY): Payer: Self-pay | Admitting: Otolaryngology

## 2017-09-03 ENCOUNTER — Other Ambulatory Visit (HOSPITAL_COMMUNITY): Payer: Self-pay | Admitting: Otolaryngology

## 2017-09-03 DIAGNOSIS — E042 Nontoxic multinodular goiter: Secondary | ICD-10-CM

## 2017-09-04 DIAGNOSIS — Z6824 Body mass index (BMI) 24.0-24.9, adult: Secondary | ICD-10-CM | POA: Diagnosis not present

## 2017-09-04 DIAGNOSIS — M069 Rheumatoid arthritis, unspecified: Secondary | ICD-10-CM | POA: Diagnosis not present

## 2017-09-04 DIAGNOSIS — E041 Nontoxic single thyroid nodule: Secondary | ICD-10-CM | POA: Diagnosis not present

## 2017-09-04 DIAGNOSIS — R1310 Dysphagia, unspecified: Secondary | ICD-10-CM | POA: Diagnosis not present

## 2017-09-10 ENCOUNTER — Other Ambulatory Visit (HOSPITAL_COMMUNITY): Payer: Self-pay | Admitting: Otolaryngology

## 2017-09-10 ENCOUNTER — Ambulatory Visit
Admission: RE | Admit: 2017-09-10 | Discharge: 2017-09-10 | Disposition: A | Discharge status: Home / Self Care | Payer: Self-pay | Source: Ambulatory Visit | Attending: Otolaryngology | Admitting: Otolaryngology

## 2017-09-10 DIAGNOSIS — R221 Localized swelling, mass and lump, neck: Secondary | ICD-10-CM

## 2017-09-12 ENCOUNTER — Other Ambulatory Visit (HOSPITAL_COMMUNITY): Payer: Self-pay | Admitting: Otolaryngology

## 2017-09-12 DIAGNOSIS — E042 Nontoxic multinodular goiter: Secondary | ICD-10-CM

## 2017-09-14 ENCOUNTER — Ambulatory Visit (HOSPITAL_COMMUNITY)
Admission: RE | Admit: 2017-09-14 | Discharge: 2017-09-14 | Disposition: A | Discharge status: Home / Self Care | Payer: Medicare Other | Source: Ambulatory Visit | Attending: Otolaryngology | Admitting: Otolaryngology

## 2017-09-14 DIAGNOSIS — E042 Nontoxic multinodular goiter: Secondary | ICD-10-CM | POA: Insufficient documentation

## 2017-09-14 DIAGNOSIS — E041 Nontoxic single thyroid nodule: Secondary | ICD-10-CM | POA: Diagnosis not present

## 2017-09-14 MED ORDER — LIDOCAINE HCL (PF) 2 % IJ SOLN
INTRAMUSCULAR | Status: AC
  Administered 2017-09-14: 10 mL
  Filled 2017-09-14: qty 10

## 2017-09-14 NOTE — Discharge Instructions (Signed)
Thyroid Biopsy °The thyroid gland is a butterfly-shaped gland located in the front of the neck. It produces hormones that affect metabolism, growth and development, and body temperature. Thyroid biopsy is a procedure in which small samples of tissue or fluid are removed from the thyroid gland. The samples are then looked at under a microscope to check for abnormalities. This procedure is done to determine the cause of thyroid problems. It may be done to check for infection, cancer, or other thyroid problems. °Two methods may be used for a thyroid biopsy. In one method, a thin needle is inserted through the skin and into the thyroid gland. In the other method, an open incision is made through the skin. °Tell a health care provider about: °· Any allergies you have. °· All medicines you are taking, including vitamins, herbs, eye drops, creams, and over-the-counter medicines. °· Any problems you or family members have had with anesthetic medicines. °· Any blood disorders you have. °· Any surgeries you have had. °· Any medical conditions you have. °What are the risks? °Generally, this is a safe procedure. However, problems can occur and include: °· Bleeding from the procedure site. °· Infection. °· Injury to structures near the thyroid gland. ° °What happens before the procedure? °· Ask your health care provider about: °? Changing or stopping your regular medicines. This is especially important if you are taking diabetes medicines or blood thinners. °? Taking medicines such as aspirin and ibuprofen. These medicines can thin your blood. Do not take these medicines before your procedure if your health care provider asks you not to. °· Do not eat or drink anything after midnight on the night before the procedure or as directed by your health care provider. °· You may have a blood sample taken. °What happens during the procedure? °Either of these methods may be used to perform a thyroid biopsy: °· Fine needle biopsy. You may  be given medicine to help you relax (sedative). You will be asked to lie on your back with your head tipped backward to extend your neck. An area on your neck will be cleaned. A needle will then be inserted through the skin of your neck. You may be asked to avoid coughing, talking, swallowing, or making sounds during some portions of the procedure. The needle will be withdrawn once the tissue or fluid samples have been removed. Pressure may be applied to your neck to reduce swelling and ensure that bleeding has stopped. The samples will be sent to a lab for examination. °· Open biopsy. You will be given medicine to make you sleep (general anesthetic). An incision will be made in your neck. A sample of thyroid tissue will be removed using surgical tools. The tissue sample will be sent for examination. In some cases, the sample may be examined during the biopsy. If that is done and cancer cells are found, some or all of the thyroid gland may be removed. The incision will be closed with stitches. ° °What happens after the procedure? °· Your recovery will be assessed and monitored. °· You may have soreness and tenderness at the site of the biopsy. This should go away after a few days. °· If you had an open biopsy, you may have a hoarse voice or sore throat for a couple days. °· It is your responsibility to get your test results. °This information is not intended to replace advice given to you by your health care provider. Make sure you discuss any questions you have with   your health care provider. °Document Released: 12/25/2006 Document Revised: 10/31/2015 Document Reviewed: 05/22/2013 °Elsevier Interactive Patient Education © 2018 [employer] ° °

## 2017-09-14 NOTE — Sedation Documentation (Signed)
Patient denies pain and is resting comfortably.  

## 2017-09-14 NOTE — Procedures (Signed)
PreOperative Dx: Multiple thyroid nodules Postoperative Dx: Multiple thyroid nodule Procedure:   US guided FNA of 2 left thyroid nodules Radiologist:  Tyron Russell Anesthesia:  3 ml of 2% lidocaine Specimen:  FNA x 5 of each of the two nodules which were biopsied  EBL:   < 1 ml Complications: None

## 2017-09-18 DIAGNOSIS — H04121 Dry eye syndrome of right lacrimal gland: Secondary | ICD-10-CM | POA: Diagnosis not present

## 2017-09-18 DIAGNOSIS — H524 Presbyopia: Secondary | ICD-10-CM | POA: Diagnosis not present

## 2017-10-24 DIAGNOSIS — R1310 Dysphagia, unspecified: Secondary | ICD-10-CM | POA: Diagnosis not present

## 2017-11-02 DIAGNOSIS — K449 Diaphragmatic hernia without obstruction or gangrene: Secondary | ICD-10-CM | POA: Diagnosis not present

## 2017-11-02 DIAGNOSIS — K219 Gastro-esophageal reflux disease without esophagitis: Secondary | ICD-10-CM | POA: Diagnosis not present

## 2017-11-02 DIAGNOSIS — K9189 Other postprocedural complications and disorders of digestive system: Secondary | ICD-10-CM | POA: Diagnosis not present

## 2017-11-02 DIAGNOSIS — T182XXA Foreign body in stomach, initial encounter: Secondary | ICD-10-CM | POA: Diagnosis not present

## 2017-11-02 DIAGNOSIS — R1310 Dysphagia, unspecified: Secondary | ICD-10-CM | POA: Diagnosis not present

## 2017-11-02 DIAGNOSIS — K222 Esophageal obstruction: Secondary | ICD-10-CM | POA: Diagnosis not present

## 2017-11-02 DIAGNOSIS — Z79899 Other long term (current) drug therapy: Secondary | ICD-10-CM | POA: Diagnosis not present

## 2017-11-03 DIAGNOSIS — K9189 Other postprocedural complications and disorders of digestive system: Secondary | ICD-10-CM | POA: Diagnosis not present

## 2017-11-03 DIAGNOSIS — K449 Diaphragmatic hernia without obstruction or gangrene: Secondary | ICD-10-CM | POA: Diagnosis not present

## 2017-11-03 DIAGNOSIS — Z79899 Other long term (current) drug therapy: Secondary | ICD-10-CM | POA: Diagnosis not present

## 2017-11-03 DIAGNOSIS — K760 Fatty (change of) liver, not elsewhere classified: Secondary | ICD-10-CM | POA: Diagnosis not present

## 2017-11-03 DIAGNOSIS — K222 Esophageal obstruction: Secondary | ICD-10-CM | POA: Diagnosis not present

## 2017-11-03 DIAGNOSIS — R1310 Dysphagia, unspecified: Secondary | ICD-10-CM | POA: Diagnosis not present

## 2017-11-03 DIAGNOSIS — K6389 Other specified diseases of intestine: Secondary | ICD-10-CM | POA: Diagnosis not present

## 2017-11-03 DIAGNOSIS — R1111 Vomiting without nausea: Secondary | ICD-10-CM | POA: Diagnosis not present

## 2017-11-03 DIAGNOSIS — K219 Gastro-esophageal reflux disease without esophagitis: Secondary | ICD-10-CM | POA: Diagnosis not present

## 2017-11-04 DIAGNOSIS — Z79899 Other long term (current) drug therapy: Secondary | ICD-10-CM | POA: Diagnosis not present

## 2017-11-04 DIAGNOSIS — R1310 Dysphagia, unspecified: Secondary | ICD-10-CM | POA: Diagnosis not present

## 2017-11-04 DIAGNOSIS — K9189 Other postprocedural complications and disorders of digestive system: Secondary | ICD-10-CM | POA: Diagnosis not present

## 2017-11-04 DIAGNOSIS — K449 Diaphragmatic hernia without obstruction or gangrene: Secondary | ICD-10-CM | POA: Diagnosis not present

## 2017-11-04 DIAGNOSIS — K222 Esophageal obstruction: Secondary | ICD-10-CM | POA: Diagnosis not present

## 2017-11-04 DIAGNOSIS — K219 Gastro-esophageal reflux disease without esophagitis: Secondary | ICD-10-CM | POA: Diagnosis not present

## 2017-11-21 DIAGNOSIS — K222 Esophageal obstruction: Secondary | ICD-10-CM | POA: Diagnosis not present

## 2017-11-21 DIAGNOSIS — K9189 Other postprocedural complications and disorders of digestive system: Secondary | ICD-10-CM | POA: Diagnosis not present

## 2017-12-04 DIAGNOSIS — R1310 Dysphagia, unspecified: Secondary | ICD-10-CM | POA: Diagnosis not present

## 2017-12-04 DIAGNOSIS — Z6823 Body mass index (BMI) 23.0-23.9, adult: Secondary | ICD-10-CM | POA: Diagnosis not present

## 2017-12-26 DIAGNOSIS — Z23 Encounter for immunization: Secondary | ICD-10-CM | POA: Diagnosis not present

## 2017-12-26 DIAGNOSIS — R1310 Dysphagia, unspecified: Secondary | ICD-10-CM | POA: Diagnosis not present

## 2017-12-26 DIAGNOSIS — Z6823 Body mass index (BMI) 23.0-23.9, adult: Secondary | ICD-10-CM | POA: Diagnosis not present

## 2018-01-10 DIAGNOSIS — T18128A Food in esophagus causing other injury, initial encounter: Secondary | ICD-10-CM | POA: Diagnosis not present

## 2018-01-10 DIAGNOSIS — K222 Esophageal obstruction: Secondary | ICD-10-CM | POA: Diagnosis not present

## 2018-01-10 DIAGNOSIS — R1310 Dysphagia, unspecified: Secondary | ICD-10-CM | POA: Diagnosis not present

## 2018-01-23 DIAGNOSIS — R1310 Dysphagia, unspecified: Secondary | ICD-10-CM | POA: Diagnosis not present

## 2018-01-23 DIAGNOSIS — Z6823 Body mass index (BMI) 23.0-23.9, adult: Secondary | ICD-10-CM | POA: Diagnosis not present

## 2018-01-28 DIAGNOSIS — K222 Esophageal obstruction: Secondary | ICD-10-CM | POA: Diagnosis not present

## 2018-01-28 DIAGNOSIS — R1310 Dysphagia, unspecified: Secondary | ICD-10-CM | POA: Diagnosis not present

## 2018-01-28 DIAGNOSIS — T18128A Food in esophagus causing other injury, initial encounter: Secondary | ICD-10-CM | POA: Diagnosis not present

## 2018-02-20 DIAGNOSIS — R1310 Dysphagia, unspecified: Secondary | ICD-10-CM | POA: Diagnosis not present

## 2018-02-20 DIAGNOSIS — K222 Esophageal obstruction: Secondary | ICD-10-CM | POA: Diagnosis not present

## 2018-03-20 DIAGNOSIS — K222 Esophageal obstruction: Secondary | ICD-10-CM | POA: Diagnosis not present

## 2018-03-20 DIAGNOSIS — D649 Anemia, unspecified: Secondary | ICD-10-CM | POA: Diagnosis not present

## 2018-03-20 DIAGNOSIS — R627 Adult failure to thrive: Secondary | ICD-10-CM | POA: Diagnosis not present

## 2018-03-20 DIAGNOSIS — K9189 Other postprocedural complications and disorders of digestive system: Secondary | ICD-10-CM | POA: Diagnosis not present

## 2018-03-22 DIAGNOSIS — R42 Dizziness and giddiness: Secondary | ICD-10-CM | POA: Diagnosis not present

## 2018-03-22 DIAGNOSIS — M069 Rheumatoid arthritis, unspecified: Secondary | ICD-10-CM | POA: Diagnosis not present

## 2018-03-22 DIAGNOSIS — I10 Essential (primary) hypertension: Secondary | ICD-10-CM | POA: Diagnosis not present

## 2018-04-08 DIAGNOSIS — Z23 Encounter for immunization: Secondary | ICD-10-CM | POA: Diagnosis not present

## 2018-04-08 DIAGNOSIS — Z0001 Encounter for general adult medical examination with abnormal findings: Secondary | ICD-10-CM | POA: Diagnosis not present

## 2018-04-10 DIAGNOSIS — D509 Iron deficiency anemia, unspecified: Secondary | ICD-10-CM | POA: Diagnosis not present

## 2018-04-18 DIAGNOSIS — D509 Iron deficiency anemia, unspecified: Secondary | ICD-10-CM | POA: Diagnosis not present

## 2018-05-17 DIAGNOSIS — D509 Iron deficiency anemia, unspecified: Secondary | ICD-10-CM | POA: Diagnosis not present

## 2018-05-22 DIAGNOSIS — R1310 Dysphagia, unspecified: Secondary | ICD-10-CM | POA: Diagnosis not present

## 2018-05-22 DIAGNOSIS — K222 Esophageal obstruction: Secondary | ICD-10-CM | POA: Diagnosis not present

## 2018-09-25 DIAGNOSIS — R1310 Dysphagia, unspecified: Secondary | ICD-10-CM | POA: Diagnosis not present

## 2018-09-25 DIAGNOSIS — K222 Esophageal obstruction: Secondary | ICD-10-CM | POA: Diagnosis not present

## 2018-10-02 DIAGNOSIS — K440 Diaphragmatic hernia with obstruction, without gangrene: Secondary | ICD-10-CM | POA: Diagnosis not present

## 2018-10-02 DIAGNOSIS — Z6822 Body mass index (BMI) 22.0-22.9, adult: Secondary | ICD-10-CM | POA: Diagnosis not present

## 2018-10-09 DIAGNOSIS — R269 Unspecified abnormalities of gait and mobility: Secondary | ICD-10-CM | POA: Diagnosis not present

## 2018-10-09 DIAGNOSIS — M19219 Secondary osteoarthritis, unspecified shoulder: Secondary | ICD-10-CM | POA: Diagnosis not present

## 2018-12-22 IMAGING — RF DG ESOPHAGUS
5 series · 20 of 20 positions shown · IV contrast (isovue)
Comparison: None.

CLINICAL DATA: Postop Nissen fundoplication repair and esophageal
stenting

EXAM:
ESOPHOGRAM/BARIUM SWALLOW
TECHNIQUE: Single contrast examination was performed using  75 mL Isovue 300.
FLUOROSCOPY TIME:  Fluoroscopy Time:  1.3 min
Radiation Exposure Index (if provided by the fluoroscopic device):
26 mGy
Number of Acquired Spot Images: 0

[Series 14: cp_standard · 0.36mm/px · 4 of 108 frames shown (1 of 5)]
[frame 17/108]
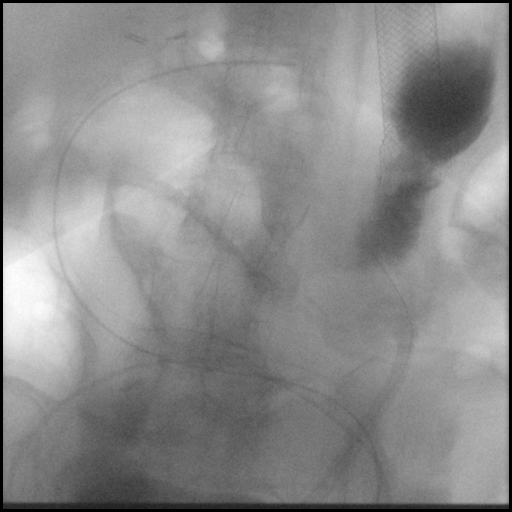
[frame 39/108]
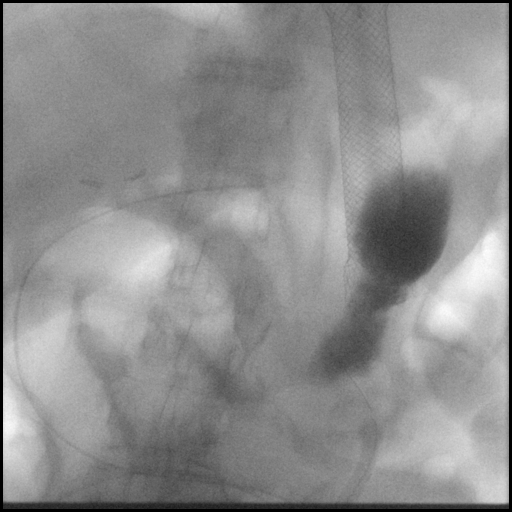
[frame 55/108]
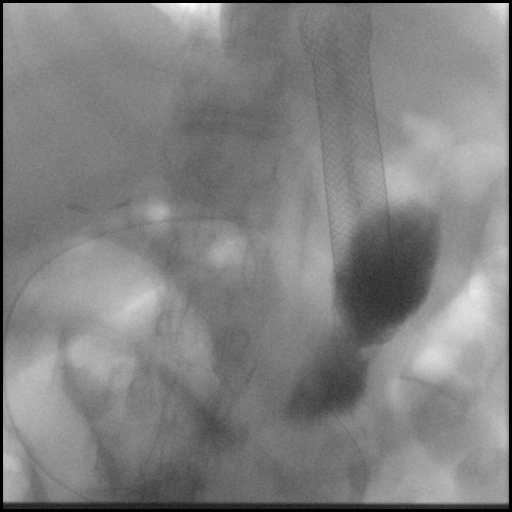
[frame 92/108]
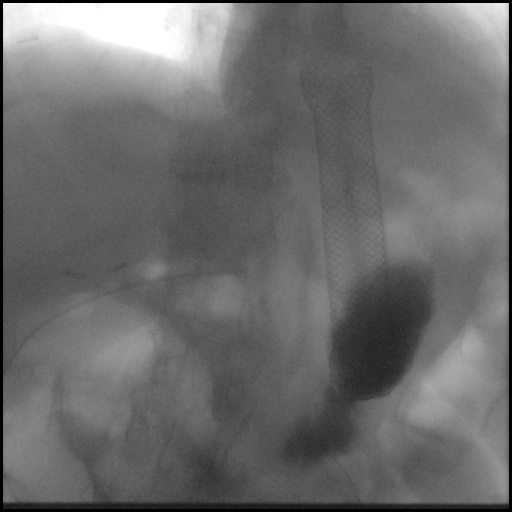

[Series 15: cp_standard · 0.36mm/px · 4 of 172 frames shown (2 of 5)]
[frame 26/172]
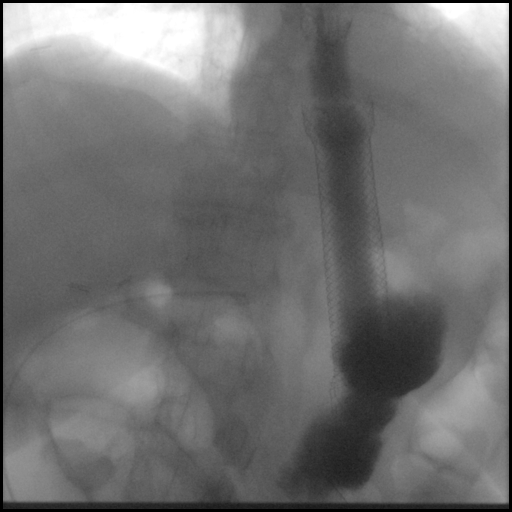
[frame 74/172]
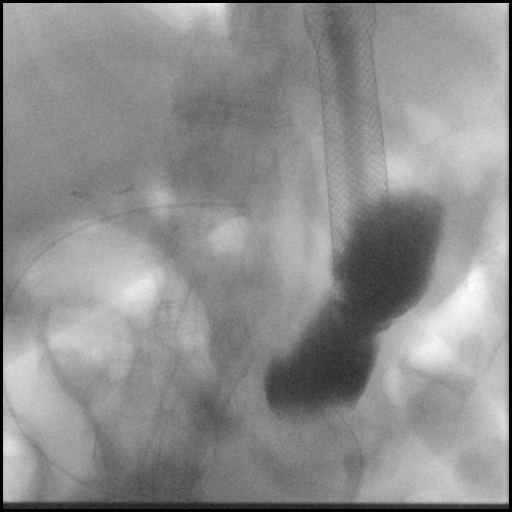
[frame 87/172]
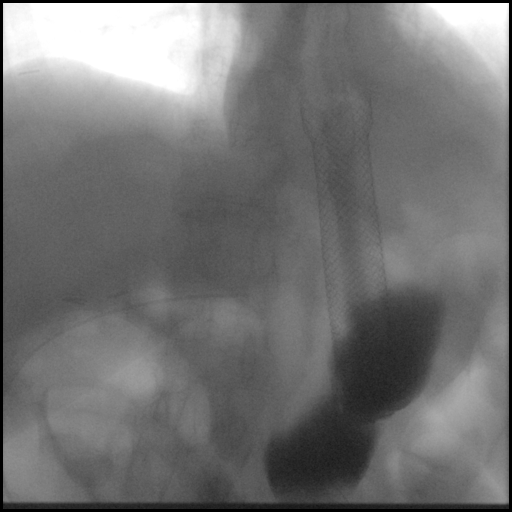
[frame 147/172]
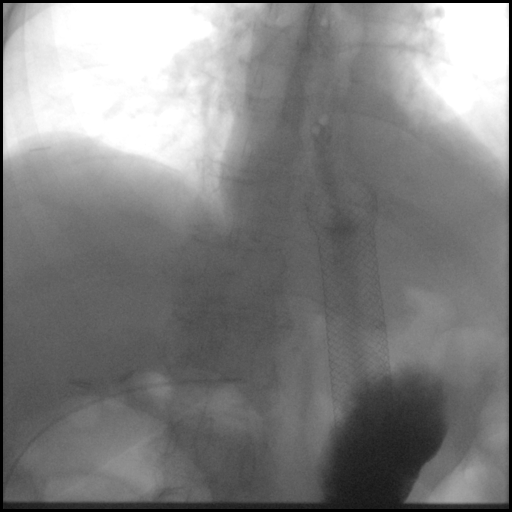

[Series 16: cp_standard · 0.34mm/px · 4 of 108 frames shown (3 of 5)]
[frame 17/108]
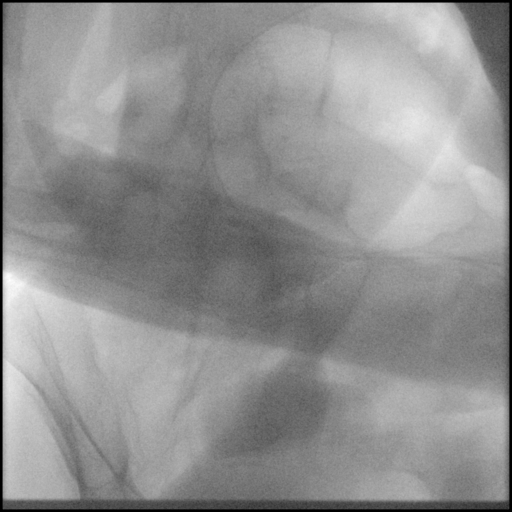
[frame 44/108]
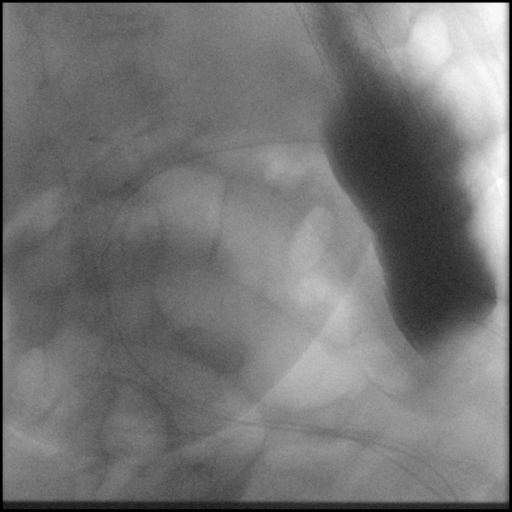
[frame 55/108]
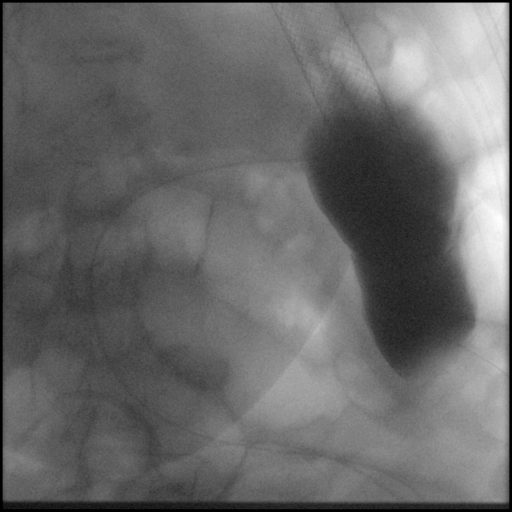
[frame 92/108]
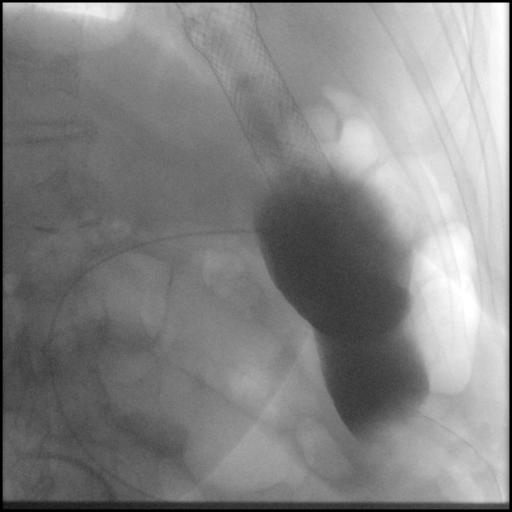

[Series 17: cp_standard · 0.34mm/px · 4 of 152 frames shown (4 of 5)]
[frame 23/152]
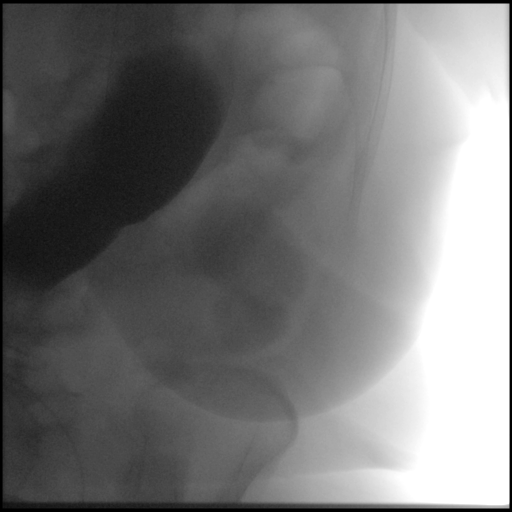
[frame 35/152]
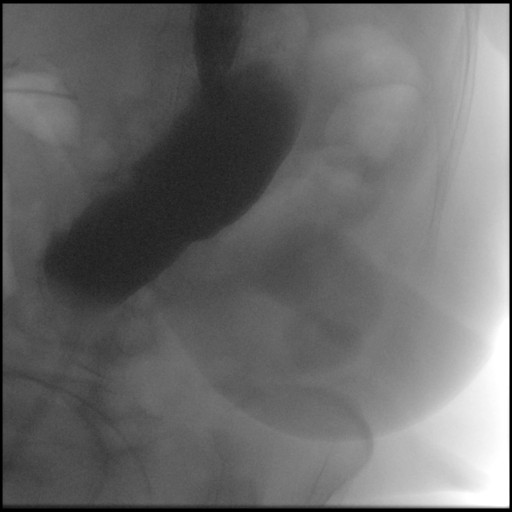
[frame 77/152]
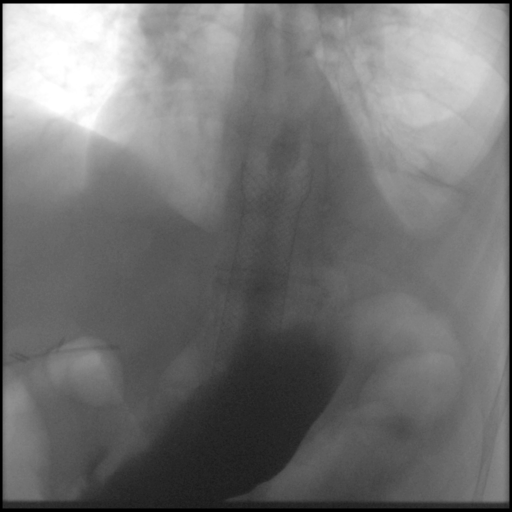
[frame 130/152]
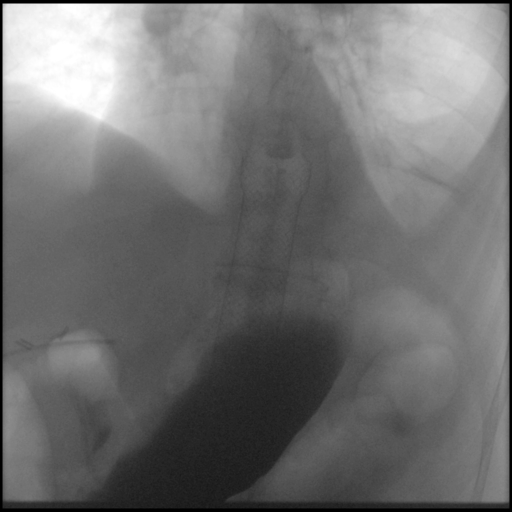

[Series 18: cp_standard · 0.34mm/px · 4 of 194 frames shown (5 of 5)]
[frame 30/194]
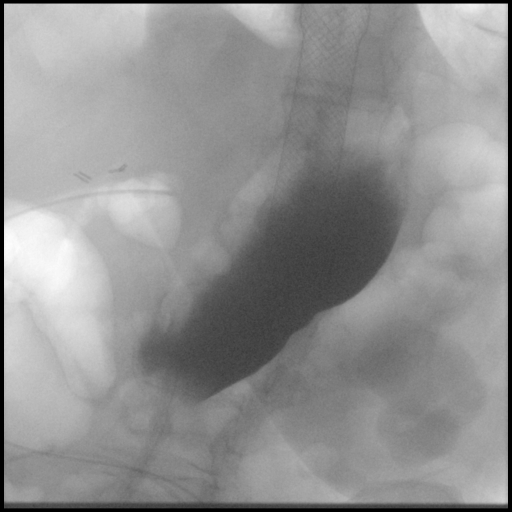
[frame 77/194]
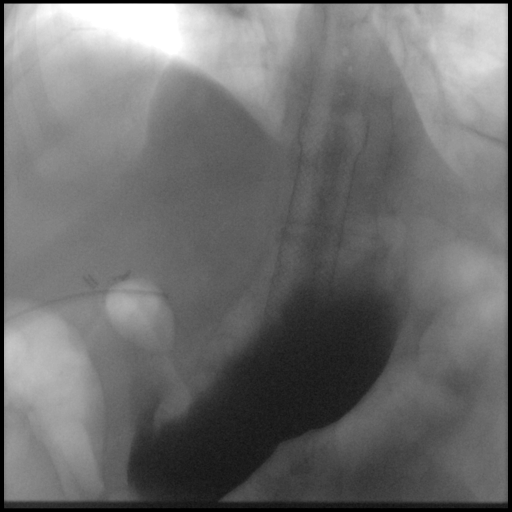
[frame 98/194]
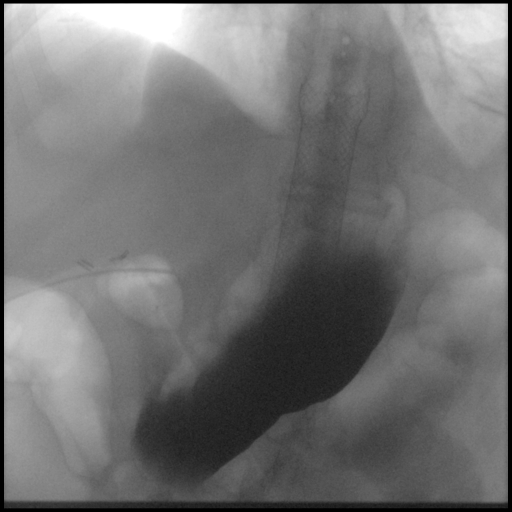
[frame 165/194]
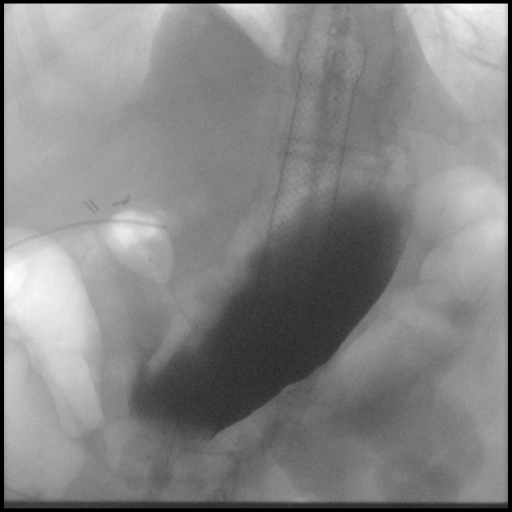

[20 of 20 positions shown; findings below may reference images not displayed]

FINDINGS: Fluoroscopic evaluation of swallowing demonstrates widely patent
distal esophageal stent. No evidence of leak or extravasation. Slow
emptying of the stomach, likely related to postoperative
gastroparesis.
IMPRESSION: Distal esophageal stent widely patent.  No evidence of leak.

## 2019-01-03 DIAGNOSIS — Z23 Encounter for immunization: Secondary | ICD-10-CM | POA: Diagnosis not present

## 2019-02-14 DIAGNOSIS — K222 Esophageal obstruction: Secondary | ICD-10-CM | POA: Diagnosis not present

## 2019-02-14 DIAGNOSIS — Z9884 Bariatric surgery status: Secondary | ICD-10-CM | POA: Diagnosis not present

## 2019-02-14 DIAGNOSIS — R1310 Dysphagia, unspecified: Secondary | ICD-10-CM | POA: Diagnosis not present

## 2019-04-11 DIAGNOSIS — D649 Anemia, unspecified: Secondary | ICD-10-CM | POA: Diagnosis not present

## 2019-04-11 DIAGNOSIS — E041 Nontoxic single thyroid nodule: Secondary | ICD-10-CM | POA: Diagnosis not present

## 2019-04-11 DIAGNOSIS — I10 Essential (primary) hypertension: Secondary | ICD-10-CM | POA: Diagnosis not present

## 2019-04-11 DIAGNOSIS — D510 Vitamin B12 deficiency anemia due to intrinsic factor deficiency: Secondary | ICD-10-CM | POA: Diagnosis not present

## 2019-04-11 DIAGNOSIS — M069 Rheumatoid arthritis, unspecified: Secondary | ICD-10-CM | POA: Diagnosis not present

## 2019-04-11 DIAGNOSIS — Z13220 Encounter for screening for lipoid disorders: Secondary | ICD-10-CM | POA: Diagnosis not present

## 2019-04-16 DIAGNOSIS — R1310 Dysphagia, unspecified: Secondary | ICD-10-CM | POA: Diagnosis not present

## 2019-04-16 DIAGNOSIS — Z0001 Encounter for general adult medical examination with abnormal findings: Secondary | ICD-10-CM | POA: Diagnosis not present

## 2019-04-16 DIAGNOSIS — E041 Nontoxic single thyroid nodule: Secondary | ICD-10-CM | POA: Diagnosis not present

## 2019-04-16 DIAGNOSIS — D649 Anemia, unspecified: Secondary | ICD-10-CM | POA: Diagnosis not present

## 2019-04-16 DIAGNOSIS — Z6822 Body mass index (BMI) 22.0-22.9, adult: Secondary | ICD-10-CM | POA: Diagnosis not present

## 2019-04-16 DIAGNOSIS — R634 Abnormal weight loss: Secondary | ICD-10-CM | POA: Diagnosis not present

## 2019-05-19 DIAGNOSIS — S0990XA Unspecified injury of head, initial encounter: Secondary | ICD-10-CM | POA: Diagnosis not present

## 2019-05-19 DIAGNOSIS — W01198A Fall on same level from slipping, tripping and stumbling with subsequent striking against other object, initial encounter: Secondary | ICD-10-CM | POA: Diagnosis not present

## 2019-05-19 DIAGNOSIS — S0003XA Contusion of scalp, initial encounter: Secondary | ICD-10-CM | POA: Diagnosis not present

## 2019-05-19 DIAGNOSIS — M1990 Unspecified osteoarthritis, unspecified site: Secondary | ICD-10-CM | POA: Diagnosis not present

## 2019-05-19 DIAGNOSIS — Z79899 Other long term (current) drug therapy: Secondary | ICD-10-CM | POA: Diagnosis not present

## 2019-05-20 DIAGNOSIS — S0990XA Unspecified injury of head, initial encounter: Secondary | ICD-10-CM | POA: Diagnosis not present

## 2019-06-30 DIAGNOSIS — K222 Esophageal obstruction: Secondary | ICD-10-CM | POA: Diagnosis not present

## 2019-06-30 DIAGNOSIS — R1310 Dysphagia, unspecified: Secondary | ICD-10-CM | POA: Diagnosis not present

## 2019-07-15 DIAGNOSIS — M810 Age-related osteoporosis without current pathological fracture: Secondary | ICD-10-CM | POA: Diagnosis not present

## 2019-07-15 DIAGNOSIS — Z0389 Encounter for observation for other suspected diseases and conditions ruled out: Secondary | ICD-10-CM | POA: Diagnosis not present

## 2019-10-07 DIAGNOSIS — K440 Diaphragmatic hernia with obstruction, without gangrene: Secondary | ICD-10-CM | POA: Diagnosis not present

## 2019-10-07 DIAGNOSIS — R1310 Dysphagia, unspecified: Secondary | ICD-10-CM | POA: Diagnosis not present

## 2019-10-07 DIAGNOSIS — M810 Age-related osteoporosis without current pathological fracture: Secondary | ICD-10-CM | POA: Diagnosis not present

## 2019-10-07 DIAGNOSIS — R634 Abnormal weight loss: Secondary | ICD-10-CM | POA: Diagnosis not present

## 2019-10-07 DIAGNOSIS — I10 Essential (primary) hypertension: Secondary | ICD-10-CM | POA: Diagnosis not present

## 2020-04-08 DIAGNOSIS — Z1329 Encounter for screening for other suspected endocrine disorder: Secondary | ICD-10-CM | POA: Diagnosis not present

## 2020-04-08 DIAGNOSIS — I10 Essential (primary) hypertension: Secondary | ICD-10-CM | POA: Diagnosis not present

## 2020-04-08 DIAGNOSIS — Z13220 Encounter for screening for lipoid disorders: Secondary | ICD-10-CM | POA: Diagnosis not present

## 2020-04-12 DIAGNOSIS — K440 Diaphragmatic hernia with obstruction, without gangrene: Secondary | ICD-10-CM | POA: Diagnosis not present

## 2020-04-12 DIAGNOSIS — M19049 Primary osteoarthritis, unspecified hand: Secondary | ICD-10-CM | POA: Diagnosis not present

## 2020-04-12 DIAGNOSIS — R1310 Dysphagia, unspecified: Secondary | ICD-10-CM | POA: Diagnosis not present

## 2020-04-12 DIAGNOSIS — R634 Abnormal weight loss: Secondary | ICD-10-CM | POA: Diagnosis not present

## 2020-04-12 DIAGNOSIS — M810 Age-related osteoporosis without current pathological fracture: Secondary | ICD-10-CM | POA: Diagnosis not present

## 2020-04-12 DIAGNOSIS — I10 Essential (primary) hypertension: Secondary | ICD-10-CM | POA: Diagnosis not present

## 2020-07-02 ENCOUNTER — Other Ambulatory Visit: Payer: Self-pay | Admitting: Orthopedic Surgery

## 2020-07-22 ENCOUNTER — Other Ambulatory Visit: Payer: Self-pay

## 2020-07-22 ENCOUNTER — Encounter (HOSPITAL_BASED_OUTPATIENT_CLINIC_OR_DEPARTMENT_OTHER): Payer: Self-pay | Admitting: Orthopedic Surgery

## 2020-07-26 ENCOUNTER — Other Ambulatory Visit (HOSPITAL_COMMUNITY): Payer: Medicare Other

## 2020-07-29 ENCOUNTER — Ambulatory Visit (HOSPITAL_BASED_OUTPATIENT_CLINIC_OR_DEPARTMENT_OTHER)
Admission: RE | Admit: 2020-07-29 | Discharge: 2020-07-29 | Disposition: A | Discharge status: Home / Self Care | Payer: Medicare Other | Attending: Orthopedic Surgery | Admitting: Orthopedic Surgery

## 2020-07-29 ENCOUNTER — Ambulatory Visit (HOSPITAL_BASED_OUTPATIENT_CLINIC_OR_DEPARTMENT_OTHER): Payer: Medicare Other | Admitting: Anesthesiology

## 2020-07-29 ENCOUNTER — Other Ambulatory Visit: Payer: Self-pay

## 2020-07-29 ENCOUNTER — Encounter (HOSPITAL_BASED_OUTPATIENT_CLINIC_OR_DEPARTMENT_OTHER): Payer: Self-pay | Admitting: Orthopedic Surgery

## 2020-07-29 ENCOUNTER — Encounter (HOSPITAL_BASED_OUTPATIENT_CLINIC_OR_DEPARTMENT_OTHER)
Admission: RE | Disposition: A | Discharge status: Home / Self Care | Payer: Self-pay | Source: Home / Self Care | Attending: Orthopedic Surgery

## 2020-07-29 DIAGNOSIS — X58XXXA Exposure to other specified factors, initial encounter: Secondary | ICD-10-CM | POA: Insufficient documentation

## 2020-07-29 DIAGNOSIS — S63212A Subluxation of metacarpophalangeal joint of right middle finger, initial encounter: Secondary | ICD-10-CM | POA: Diagnosis not present

## 2020-07-29 DIAGNOSIS — M21831 Other specified acquired deformities of right forearm: Secondary | ICD-10-CM | POA: Diagnosis present

## 2020-07-29 DIAGNOSIS — M069 Rheumatoid arthritis, unspecified: Secondary | ICD-10-CM | POA: Diagnosis not present

## 2020-07-29 DIAGNOSIS — S63216A Subluxation of metacarpophalangeal joint of right little finger, initial encounter: Secondary | ICD-10-CM | POA: Insufficient documentation

## 2020-07-29 DIAGNOSIS — S63214A Subluxation of metacarpophalangeal joint of right ring finger, initial encounter: Secondary | ICD-10-CM | POA: Diagnosis not present

## 2020-07-29 HISTORY — PX: TENDON TRANSFER: SHX6109

## 2020-07-29 HISTORY — PX: SYNOVECTOMY: SHX5180

## 2020-07-29 SURGERY — TRANSFER, TENDON
Anesthesia: Monitor Anesthesia Care | Site: Hand | Laterality: Right

## 2020-07-29 MED ORDER — OXYCODONE HCL 5 MG/5ML PO SOLN: 5.0000 mg | Freq: Once | ORAL | Status: DC | PRN

## 2020-07-29 MED ORDER — ROPIVACAINE HCL 7.5 MG/ML IJ SOLN
INTRAMUSCULAR | Status: DC | PRN
  Administered 2020-07-29 (×4): 5 mL via PERINEURAL

## 2020-07-29 MED ORDER — FENTANYL CITRATE (PF) 100 MCG/2ML IJ SOLN
100.0000 ug | Freq: Once | INTRAMUSCULAR | Status: AC
  Administered 2020-07-29: 100 ug via INTRAVENOUS

## 2020-07-29 MED ORDER — CEFAZOLIN SODIUM-DEXTROSE 2-4 GM/100ML-% IV SOLN
2.0000 g | INTRAVENOUS | Status: AC
  Administered 2020-07-29: 2 g via INTRAVENOUS

## 2020-07-29 MED ORDER — PHENYLEPHRINE 40 MCG/ML (10ML) SYRINGE FOR IV PUSH (FOR BLOOD PRESSURE SUPPORT)
PREFILLED_SYRINGE | INTRAVENOUS | Status: AC
  Filled 2020-07-29: qty 10

## 2020-07-29 MED ORDER — ONDANSETRON HCL 4 MG/2ML IJ SOLN
INTRAMUSCULAR | Status: DC | PRN
  Administered 2020-07-29: 4 mg via INTRAVENOUS

## 2020-07-29 MED ORDER — DEXAMETHASONE SODIUM PHOSPHATE 10 MG/ML IJ SOLN
INTRAMUSCULAR | Status: DC | PRN
  Administered 2020-07-29: 10 mg

## 2020-07-29 MED ORDER — PROPOFOL 500 MG/50ML IV EMUL
INTRAVENOUS | Status: DC | PRN
  Administered 2020-07-29: 35 ug/kg/min via INTRAVENOUS

## 2020-07-29 MED ORDER — LACTATED RINGERS IV SOLN: INTRAVENOUS | Status: DC

## 2020-07-29 MED ORDER — CLONIDINE HCL (ANALGESIA) 100 MCG/ML EP SOLN
EPIDURAL | Status: DC | PRN
  Administered 2020-07-29: 100 ug

## 2020-07-29 MED ORDER — PHENYLEPHRINE HCL (PRESSORS) 10 MG/ML IV SOLN
INTRAVENOUS | Status: DC | PRN
  Administered 2020-07-29: 40 ug via INTRAVENOUS

## 2020-07-29 MED ORDER — FENTANYL CITRATE (PF) 100 MCG/2ML IJ SOLN: 25.0000 ug | INTRAMUSCULAR | Status: DC | PRN

## 2020-07-29 MED ORDER — HYDROCODONE-ACETAMINOPHEN 5-325 MG PO TABS: 1.0000 | ORAL_TABLET | Freq: Four times a day (QID) | ORAL | 0 refills | Status: DC | PRN

## 2020-07-29 MED ORDER — OXYCODONE HCL 5 MG PO TABS: 5.0000 mg | ORAL_TABLET | Freq: Once | ORAL | Status: DC | PRN

## 2020-07-29 MED ORDER — CEFAZOLIN SODIUM-DEXTROSE 2-4 GM/100ML-% IV SOLN
INTRAVENOUS | Status: AC
  Filled 2020-07-29: qty 100

## 2020-07-29 MED ORDER — MIDAZOLAM HCL 2 MG/2ML IJ SOLN
INTRAMUSCULAR | Status: AC
  Filled 2020-07-29: qty 2

## 2020-07-29 MED ORDER — FENTANYL CITRATE (PF) 100 MCG/2ML IJ SOLN
INTRAMUSCULAR | Status: AC
  Filled 2020-07-29: qty 2

## 2020-07-29 MED ORDER — ACETAMINOPHEN 160 MG/5ML PO SOLN: 325.0000 mg | ORAL | Status: DC | PRN

## 2020-07-29 MED ORDER — ONDANSETRON HCL 4 MG/2ML IJ SOLN: 4.0000 mg | Freq: Once | INTRAMUSCULAR | Status: DC | PRN

## 2020-07-29 MED ORDER — ACETAMINOPHEN 325 MG PO TABS: 325.0000 mg | ORAL_TABLET | ORAL | Status: DC | PRN

## 2020-07-29 MED ORDER — 0.9 % SODIUM CHLORIDE (POUR BTL) OPTIME
TOPICAL | Status: DC | PRN
  Administered 2020-07-29: 200 mL

## 2020-07-29 SURGICAL SUPPLY — 82 items
APL PRP STRL LF DISP 70% ISPRP (MISCELLANEOUS) ×2
BAG DECANTER FOR FLEXI CONT (MISCELLANEOUS) IMPLANT
BALL CTTN LRG ABS STRL LF (GAUZE/BANDAGES/DRESSINGS)
BLADE MINI RND TIP GREEN BEAV (BLADE) ×3 IMPLANT
BLADE SURG 15 STRL LF DISP TIS (BLADE) ×2 IMPLANT
BLADE SURG 15 STRL SS (BLADE) ×3
BNDG CMPR 9X4 STRL LF SNTH (GAUZE/BANDAGES/DRESSINGS) ×2
BNDG COHESIVE 3X5 TAN STRL LF (GAUZE/BANDAGES/DRESSINGS) ×3 IMPLANT
BNDG ESMARK 4X9 LF (GAUZE/BANDAGES/DRESSINGS) ×3 IMPLANT
BNDG GAUZE ELAST 4 BULKY (GAUZE/BANDAGES/DRESSINGS) ×3 IMPLANT
CHLORAPREP W/TINT 26 (MISCELLANEOUS) ×3 IMPLANT
CORD BIPOLAR FORCEPS 12FT (ELECTRODE) ×3 IMPLANT
COTTONBALL LRG STERILE PKG (GAUZE/BANDAGES/DRESSINGS) IMPLANT
COVER BACK TABLE 60X90IN (DRAPES) ×3 IMPLANT
COVER MAYO STAND STRL (DRAPES) ×3 IMPLANT
COVER WAND RF STERILE (DRAPES) IMPLANT
CUFF TOURN SGL QUICK 18X4 (TOURNIQUET CUFF) ×3 IMPLANT
DECANTER SPIKE VIAL GLASS SM (MISCELLANEOUS) IMPLANT
DRAPE EXTREMITY T 121X128X90 (DISPOSABLE) ×3 IMPLANT
DRAPE OEC MINIVIEW 54X84 (DRAPES) IMPLANT
DRAPE SURG 17X23 STRL (DRAPES) ×3 IMPLANT
DRSG PAD ABDOMINAL 8X10 ST (GAUZE/BANDAGES/DRESSINGS) IMPLANT
GAUZE 4X4 16PLY RFD (DISPOSABLE) IMPLANT
GAUZE SPONGE 4X4 12PLY STRL (GAUZE/BANDAGES/DRESSINGS) ×3 IMPLANT
GAUZE XEROFORM 1X8 LF (GAUZE/BANDAGES/DRESSINGS) ×3 IMPLANT
GLOVE SRG 8 PF TXTR STRL LF DI (GLOVE) ×2 IMPLANT
GLOVE SURG ENC MOIS LTX SZ7.5 (GLOVE) ×3 IMPLANT
GLOVE SURG ORTHO LTX SZ8 (GLOVE) ×3 IMPLANT
GLOVE SURG UNDER POLY LF SZ8 (GLOVE) ×3
GLOVE SURG UNDER POLY LF SZ8.5 (GLOVE) ×3 IMPLANT
GOWN STRL REUS W/ TWL LRG LVL3 (GOWN DISPOSABLE) ×2 IMPLANT
GOWN STRL REUS W/TWL LRG LVL3 (GOWN DISPOSABLE) ×3
GOWN STRL REUS W/TWL XL LVL3 (GOWN DISPOSABLE) ×6 IMPLANT
IV SET EXT 30 76VOL 4 MALE LL (IV SETS) IMPLANT
K-WIRE .035X4 (WIRE) IMPLANT
LOOP VESSEL MAXI BLUE (MISCELLANEOUS) IMPLANT
NEEDLE HYPO 22GX1.5 SAFETY (NEEDLE) IMPLANT
NEEDLE KEITH (NEEDLE) IMPLANT
NEEDLE KEITH SZ10 STRAIGHT (NEEDLE) IMPLANT
NEEDLE PRECISIONGLIDE 27X1.5 (NEEDLE) IMPLANT
NS IRRIG 1000ML POUR BTL (IV SOLUTION) ×3 IMPLANT
PACK BASIN DAY SURGERY FS (CUSTOM PROCEDURE TRAY) ×3 IMPLANT
PAD CAST 3X4 CTTN HI CHSV (CAST SUPPLIES) ×2 IMPLANT
PADDING CAST ABS 3INX4YD NS (CAST SUPPLIES)
PADDING CAST ABS 4INX4YD NS (CAST SUPPLIES)
PADDING CAST ABS COTTON 3X4 (CAST SUPPLIES) IMPLANT
PADDING CAST ABS COTTON 4X4 ST (CAST SUPPLIES) IMPLANT
PADDING CAST COTTON 3X4 STRL (CAST SUPPLIES) ×3
PASSER SUT SWANSON 36MM LOOP (INSTRUMENTS) IMPLANT
SLEEVE SCD COMPRESS KNEE MED (STOCKING) ×3 IMPLANT
SLING ARM FOAM STRAP LRG (SOFTGOODS) ×3 IMPLANT
SPLINT PLASTER CAST XFAST 3X15 (CAST SUPPLIES) ×40 IMPLANT
SPLINT PLASTER XTRA FASTSET 3X (CAST SUPPLIES) ×20
STOCKINETTE 4X48 STRL (DRAPES) ×3 IMPLANT
SUT CHROMIC 5 0 P 3 (SUTURE) ×3 IMPLANT
SUT ETHIBOND 3-0 V-5 (SUTURE) IMPLANT
SUT ETHILON 4 0 PS 2 18 (SUTURE) ×9 IMPLANT
SUT FIBERWIRE 2-0 18 17.9 3/8 (SUTURE)
SUT FIBERWIRE 3-0 18 TAPR NDL (SUTURE)
SUT FIBERWIRE 4-0 18 TAPR NDL (SUTURE)
SUT MERSILENE 2.0 SH NDLE (SUTURE) IMPLANT
SUT MERSILENE 4 0 P 3 (SUTURE) ×12 IMPLANT
SUT MON AB 3-0 SH 27 (SUTURE)
SUT MON AB 3-0 SH27 (SUTURE) IMPLANT
SUT PROLENE 2 0 SH DA (SUTURE) IMPLANT
SUT SILK 2 0 PERMA HAND 18 BK (SUTURE) IMPLANT
SUT SILK 4 0 PS 2 (SUTURE) IMPLANT
SUT STEEL 3 0 (SUTURE) IMPLANT
SUT VIC AB 3-0 PS1 18 (SUTURE)
SUT VIC AB 3-0 PS1 18XBRD (SUTURE) IMPLANT
SUT VIC AB 4-0 P-3 18XBRD (SUTURE) IMPLANT
SUT VIC AB 4-0 P2 18 (SUTURE) IMPLANT
SUT VIC AB 4-0 P3 18 (SUTURE)
SUT VICRYL 4-0 PS2 18IN ABS (SUTURE) IMPLANT
SUTURE FIBERWR 2-0 18 17.9 3/8 (SUTURE) IMPLANT
SUTURE FIBERWR 3-0 18 TAPR NDL (SUTURE) IMPLANT
SUTURE FIBERWR 4-0 18 TAPR NDL (SUTURE) IMPLANT
SYR BULB EAR ULCER 3OZ GRN STR (SYRINGE) ×3 IMPLANT
SYR CONTROL 10ML LL (SYRINGE) IMPLANT
TOWEL GREEN STERILE FF (TOWEL DISPOSABLE) ×6 IMPLANT
TUBE FEEDING ENTERAL 5FR 16IN (TUBING) IMPLANT
UNDERPAD 30X36 HEAVY ABSORB (UNDERPADS AND DIAPERS) ×3 IMPLANT

## 2020-07-29 NOTE — Brief Op Note (Signed)
07/29/2020  11:28 AM  PATIENT:  Sydney [name]  75 y.o. female  PRE-OPERATIVE DIAGNOSIS:  RHEUMATOID ARTHRITIS,SUBLUXATION METACARPAL PHALANGEAL RIGHT MIDDLE FINGER, RIGHT RING FINGER , RIGHT SMALL FINGER; RADIAL DEVIATION OF WRIST  POST-OPERATIVE DIAGNOSIS:  RHEUMATOID ARTHRITIS,SUBLUXATION METACARPAL PHALANGEAL RIGHT MIDDLE FINGER, RIGHT RING FINGER , RIGHT SMALL FINGER; RADIAL DEVIATION OF WRIST  PROCEDURE:  Procedure(s) with comments: EXTENSOR CARPI RADIALIS LONGUS TRANSFER TO EXTENSOR CARPI ULNARIS, CENTRALIZATION EXTENSOR TENDONS RIGHT MIDDLE FINGER, RIGHT RING  FINGER, RIGHT SMALL FINGER, CROSS INTRINSIC TRANSFER RIGHT MIDDLE FINGER RIGHT RING FINGER AND RIGHT SMALL FINGER (Right) - AXILLARY BLOCK Metacarpal phalangeal synovectomies (Right)  SURGEON:  Surgeon(s) and Role:    * Cindee Salt, MD - Primary    * Betha Loa, MD - Assisting  PHYSICIAN ASSISTANT:      ANESTHESIA:   regional and IV sedation  EBL:  47ml  BLOOD ADMINISTERED:none  DRAINS: none   LOCAL MEDICATIONS USED:  NONE  SPECIMEN:  No Specimen  DISPOSITION OF SPECIMEN:  N/A  COUNTS:  YES  TOURNIQUET:   Total Tourniquet Time Documented: Upper Arm (Right) - 89 minutes Total: Upper Arm (Right) - 89 minutes   DICTATION: .Reubin Milan Dictation  PLAN OF CARE: Discharge to home after PACU  PATIENT DISPOSITION:  PACU - hemodynamically stable.

## 2020-07-29 NOTE — Op Note (Signed)
I assisted Surgeon(s) and Role:    * Cindee Salt, MD - Primary    * Betha Loa, MD - Assisting on the Procedure(s): EXTENSOR CARPI RADIALIS LONGUS TRANSFER TO EXTENSOR CARPI ULNARIS, CENTRALIZATION EXTENSOR TENDONS RIGHT MIDDLE FINGER, RIGHT RING  FINGER, RIGHT SMALL FINGER, CROSS INTRINSIC TRANSFER RIGHT MIDDLE FINGER RIGHT RING FINGER AND RIGHT SMALL FINGER Metacarpal phalangeal synovectomies on 07/29/2020.  I provided assistance on this case as follows: retraction soft tissues, dissection, passage tendon transfers, closure wound.  Electronically signed by: Betha Loa, MD Date: 07/29/2020 Time: 11:41 AM

## 2020-07-29 NOTE — Progress Notes (Signed)
AssistedDr. Hatchett with right, ultrasound guided, supraclavicular block. Side rails up, monitors on throughout procedure. See vital signs in flow sheet. Tolerated Procedure well.  

## 2020-07-29 NOTE — Op Note (Signed)
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   ASSISTANT: Betha Loa, MD   ANESTHESIA:  Regional with sedation   INTRAVENOUS FLUIDS:  Per anesthesia flow sheet.   ESTIMATED BLOOD LOSS:  Minimal.   COMPLICATIONS:  None.   SPECIMENS:  none   TOURNIQUET TIME:    Total Tourniquet Time Documented: Upper Arm (Right) - 89 minutes Total: Upper Arm (Right) - 89 minutes    DISPOSITION:  Stable to PACU.   INDICATIONS: Patient is a 75 year old female who was seen with ulnar drift subluxation of the extensor tendons of her middle ring and small fingers on bilateral hands.  This is occurred relatively recently with no history of arthritis.  She has no history of injury.  Rheumatology consult reveals rheumatoid arthritis.  She has radial deviation of the wrist with no ulnar deviation of the index finger at the present time.  She is admitted now for centralization extensor tendons across intrinsic transfers with synovectomy metacarpal phalangeal joints middle ring and small fingers right hand tendon transfer of extensor carpi radialis longus to extensor carpi ulnaris.  Patient is seen the extremity marked by both patient and surgeon risks and complications have been discussed with the patient.  She is aware there is no  guarantee to the surgery the possibility of infection recurrence injury to arteries nerves tendons complete relief symptoms distally possibility of further destruction of the metacarpal phalangeal joints necessitating metacarpal phalangeal joint replacements in the future patient is marked and antibiotic is given OPERATIVE COURSE: A supraclavicular block was carried out without difficulty in the preoperative area. Patient is brought to the operating room placed in the supine position with the right arm free.  3-minute dry time was allowed after ChloraPrep prep.  A timeout was taken confirming patient procedure.  The limb was exsanguinated with an Esmarch bandage turn placed high in the arm was inflated to 250 mmHg.  A transverse incision was made over the metacarpal phalangeal joints of the right index middle ring and small fingers just approximately centimeter distal to the dorsal aspect of the metacarpal head.  This carried down through subcutaneous tissue.  Bleeders were electrocauterized as necessary with bipolar.  The dissection was carried between the metacarpal heads of each of the digits.  The subluxated tendons were immediately apparent on the middle ring and small.  Significant stretching of the radial sagittal fibers was present on each.  These were isolated.  The radial sagittal fibers were then elevated off from the capsule the capsule opened on the middle ring and small fingers and a partial synovectomy dorsally was performed.  This area was then irrigated and the incision closed interrupted 6-0 chromic sutures.  Attention was then directed to the ulnar lateral band of each of the digits.  Each was elevated off the index proximal phalanx for approximately 2-1/2 cm distally take  care to protect the neurovascular structures beneath.  These were elevated and dissected proximally separating each as a window limit ligament.  A suture was placed in each this was done on the index middle ring fingers.   Abductor digiti quinti muscle attachment to the fifth finger proximal phalanx was then released.  The sagittal bands on each radial side was then incised allowing the tendons to be centralized by releasing the ulnar sagittal fibers on each of the digits.  A pants over vest suture was placed with 4-0 Mersilene on first the small then ring then middle fingers allowing it to be centralized bringing the extensor tendon central slip directly over the metacarpal head on each digit.  The ulnar intrinsics of each of the digits were then passed through the central slip on the radial side of first of the small then ring and middle fingers sutured into position after passing it through the tendon bring it back on itself with figure-of-eight 4 and horizontal mattress 4-0 Mersilene sutures this maintained the same central tendon directly over the metacarpal head of each digit.  This was done with full flexion full extension.  Care was taken on each digit to be certain that it was passed beneath the dorsal intermetacarpal vessels and nerve.  Wound was copious irrigated with saline.  The skin was then closed with interrupted 4-0 nylon sutures.  A separate incision was then made over the ulnar side of the hand just distal to the ulnar styloid.  The dorsal sensory branch of the ulnar nerve was identified protected the extensor carpi ulnaris tendon was then identified.  A separate incision was then made at the base of the index metacarpal radially.  Carried down through subcutaneous tissue protecting neurovascular structures and the insertion of the extensor carpi radialis longus was then elevated off from the base of the index metacarpal.  Sutures were placed into the tendon with 4-0 Mersilene.  A separate incision was then made over the intersection area of the dorsal distal aspect of the forearm.  The skin was carried down through subcutaneous tissue.  The fascia was then incised allowing visualization of the extensor carpi  radialis longus and brevis.  The longus was then elevated and brought back through the extensor retinaculum.  A tunnel was then made crossing this over to the extensor carpi ulnaris with blunt dissection.  Each of these wounds were irrigated on the radial side of  her wrist with saline and closed with interrupted 4-0 nylon sutures.  The extensor carpi radialis longus was then passed through the extensor carpi ulnaris and sutured with multiple horizontal mattress and figure-of-eight 4-0 Mersilene sutures protecting the dorsal sensory ulnar nerve.  This wound was then irrigated and closed with interrupted 4-0 nylon sutures.  Dorsal palmar splints were placed on deflation of the tourniquet all fingers immediately pink.  She was taken to the recovery room for observation in satisfactory condition.  She will be discharged home to return to the hand center [facility] in 1 week on Norco 07/14/2023 for pain. Cindee Salt, MD Electronically signed, 07/29/20

## 2020-07-29 NOTE — Transfer of Care (Signed)
Immediate Anesthesia Transfer of Care Note  Patient: Sydney [name]  Procedure(s) Performed: EXTENSOR CARPI RADIALIS LONGUS TRANSFER TO EXTENSOR CARPI ULNARIS, CENTRALIZATION EXTENSOR TENDONS RIGHT MIDDLE FINGER, RIGHT RING  FINGER, RIGHT SMALL FINGER, CROSS INTRINSIC TRANSFER RIGHT MIDDLE FINGER RIGHT RING FINGER AND RIGHT SMALL FINGER (Right Hand) Metacarpal phalangeal synovectomies (Right Hand)  Patient Location: PACU  Anesthesia Type:MAC and Regional  Level of Consciousness: awake, alert  and oriented  Airway & Oxygen Therapy: Patient Spontanous Breathing and Patient connected to face mask oxygen  Post-op Assessment: Report given to RN and Post -op Vital signs reviewed and stable  Post vital signs: Reviewed and stable  Last Vitals:  Vitals Value Taken Time  BP 130/59 07/29/20 1132  Temp    Pulse 74 07/29/20 1134  Resp 18 07/29/20 1134  SpO2 98 % 07/29/20 1134  Vitals shown include unvalidated device data.  Last Pain:  Vitals:   07/29/20 0734  TempSrc: Oral  PainSc: 0-No pain      Patients Stated Pain Goal: 3 (94/80/16 [id number])  Complications: No complications documented.

## 2020-07-29 NOTE — Anesthesia Preprocedure Evaluation (Addendum)
Anesthesia Evaluation  Patient identified by MRN, date of birth, ID band Patient awake    Reviewed: Allergy & Precautions, NPO status , Patient's Chart, lab work & pertinent test results  Airway Mallampati: II       Dental no notable dental hx.    Pulmonary neg pulmonary ROS,    Pulmonary exam normal        Cardiovascular hypertension, Pt. on home beta blockers Normal cardiovascular exam     Neuro/Psych negative neurological ROS  negative psych ROS   GI/Hepatic Neg liver ROS,   Endo/Other    Renal/GU   negative genitourinary   Musculoskeletal  (+) Arthritis , Rheumatoid disorders,    Abdominal Normal abdominal exam  (+)   Peds  Hematology   Anesthesia Other Findings   Reproductive/Obstetrics                            Anesthesia Physical Anesthesia Plan  ASA: II  Anesthesia Plan: MAC and Regional   Post-op Pain Management:    Induction:   PONV Risk Score and Plan: 2 and Ondansetron  Airway Management Planned: Natural Airway, Nasal Cannula and Simple Face Mask  Additional Equipment: None  Intra-op Plan:   Post-operative Plan:   Informed Consent: I have reviewed the patients History and Physical, chart, labs and discussed the procedure including the risks, benefits and alternatives for the proposed anesthesia with the patient or authorized representative who has indicated his/her understanding and acceptance.       Plan Discussed with: CRNA  Anesthesia Plan Comments:        Anesthesia Quick Evaluation

## 2020-07-29 NOTE — H&P (Signed)
Sydney [name] is an 75 y.o. female.   Chief Complaint:hand deformity rightHPI: Sydney [name] is a 74-year and hand dominant female referred by Dr. Janee [name] for catching of her middle ring and small fingers bilaterally. The right has been going on for approximately 6 months the left for a month. She recalls no history of injury. She has been worked up for rheumatoid arthritis. She is not planing significant pain on her left side she is complaining of mild discomfort with attempted straightening of her right side middle ring and small fingers. She is not planing of any numbness or tingling. She has not had any injury to either side. She has not had any treatment for this. She has no history of diabetes thyroid problems or gout. Family history is negative for each of these she states her right hand is becoming relatively useless. She complains of the catching on her right side began as her left side is now  She has been referred to rheumatology. They have not decided on starting her on any medicines at the present time. She was last seen on 06/16/2020. We had discussed possibility of surgical intervention. She was started on methotrexate for her arthritis. She has been diagnosed with rheumatoid arthritis she continues complain of the deviation of the metacarpal phalangeal joints middle ring and small fingers bilaterally right greater than left. She has been in splints bilaterally.     Past Medical History:  Diagnosis Date  . Arthritis   . 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 Hiatal hernia with obstruction but no gangrene   . Hypertension   . Obesity   . 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 Seasonal allergies   . Thyroid nodule   . Vertigo     Past Surgical History:  Procedure Laterality Date  . BALLOON DILATION N/A 06/14/2017   Procedure: BALLOON DILATION;  Surgeon: Karie Soda, MD;  Location: [facility];  Service:  Gastroenterology;  Laterality: N/A;  . CATARACT EXTRACTION, BILATERAL  2016  . CHOLECYSTECTOMY    . ESOPHAGEAL MANOMETRY N/A 01/03/2017   Procedure: ESOPHAGEAL MANOMETRY (EM);  Surgeon: Napoleon Form, MD;  Location: [facility];  Service: Endoscopy;  Laterality: N/A;  . ESOPHAGEAL STENT PLACEMENT  04/13/2017   Procedure: ESOPHAGEAL STENT PLACEMENT;  Surgeon: Karie Soda, MD;  Location: [facility];  Service: General;;  . ESOPHAGOGASTRODUODENOSCOPY N/A 04/12/2017   Procedure: ESOPHAGOGASTRODUODENOSCOPY (EGD);  Surgeon: Rachael Fee, MD;  Location: [facility];  Service: Endoscopy;  Laterality: N/A;  . ESOPHAGOGASTRODUODENOSCOPY  04/13/2017   Procedure: ESOPHAGOGASTRODUODENOSCOPY (EGD) WITH BALLOON DILATION;  Surgeon: Karie Soda, MD;  Location: [facility];  Service: General;;  . ESOPHAGOGASTRODUODENOSCOPY (EGD) WITH PROPOFOL N/A 06/14/2017   Procedure: ESOPHAGOGASTRODUODENOSCOPY (EGD) WITH PROPOFOL;  Surgeon: Karie Soda, MD;  Location: [facility];  Service: Gastroenterology;  Laterality: N/A;  . FOREIGN BODY RETRIEVAL N/A 04/13/2017   Procedure: EXCISION OF FOREIGN BODY X ESOPHAGUS ;  Surgeon: Karie Soda, MD;  Location: [facility];  Service: General;  Laterality: N/A;  . GASTROINTESTINAL STENT REMOVAL N/A 06/14/2017   Procedure: GASTROINTESTINAL STENT REMOVAL;  Surgeon: Karie Soda, MD;  Location: [facility];  Service: Gastroenterology;  Laterality: N/A;  esopheageal stent removal  . INSERTION OF MESH N/A 01/31/2017   Procedure: INSERTION OF MESH;  Surgeon: Karie Soda, MD;  Location: [facility];  Service: General;  Laterality: N/A;  . LAPAROSCOPIC NISSEN FUNDOPLICATION N/A 04/13/2017   Procedure: Laproscopic lysis of Adhesions;  Surgeon: Karie Soda, MD;  Location: [facility];  Service: General;  Laterality: N/A;  . LAPAROSCOPIC ROUX-EN-Y GASTRIC BYPASS WITH UPPER ENDOSCOPY AND REMOVAL OF LAP BAND  04/13/2017   Procedure: LAPAROSCOPIC GASTRIC  EXPLORATION;  Surgeon: Karie Soda, MD;  Location: [facility];  Service: General;;  . MOUTH SURGERY    . STERIOD INJECTION  04/13/2017   Procedure: STEROID INJECTION ESOPHAGUS ;  Surgeon: Karie Soda, MD;  Location: [facility];  Service: General;;  . SUBMUCOSAL INJECTION N/A 06/14/2017   Procedure: SUBMUCOSAL INJECTION;  Surgeon: Karie Soda, MD;  Location: [facility];  Service: Gastroenterology;  Laterality: N/A;  STEROID INJECTION  . TUBAL LIGATION      Family History  Problem Relation Age of Onset  . Hypertension Mother   . Macular degeneration Mother        legally blind  . Heart Problems Mother        skips a beat  . Heart attack Father   . Leukemia Father   . Colon cancer Neg Hx   . Rectal cancer Neg Hx   . Throat cancer Neg Hx   . Esophageal cancer Neg Hx   . Stomach cancer Neg Hx    Social History:  reports that she has never smoked. She has never used smokeless tobacco. She reports that she does not drink alcohol and does not use drugs.  Allergies: No Known Allergies  No medications prior to admission.    No results found for this or any previous visit (from the past 48 hour(s)).  No results found.   Pertinent items are noted in HPI.  Height 4' 10.5" (1.486 m), weight 61.2 kg.  General appearance: alert, cooperative and appears stated age Head: Normocephalic, without obvious abnormality Neck: no JVD Resp: clear to auscultation bilaterally Cardio: regular rate and rhythm, S1, S2 normal, no murmur, click, rub or gallop GI: soft, non-tender; bowel sounds normal; no masses,  no organomegaly Extremities: ulnar deviation sublexation tendons middle ring aand small fingers right radial deviation wrist Pulses: 2+ and symmetric Skin: Skin color, texture, turgor normal. No rashes or lesions Neurologic: Grossly normal Incision/Wound: na  Assessment/Plan Assessment:  Diagnosed rheumatoid arthritis with radial deviation of the wrist ulnar deviation of metacarpal phalangeal joints middle ring and small fingers with subluxation  of the extensor tendons.     Plan: We have discussed reconstruction of her hand with her. She would like to proceed to the right side. We recommend transfer of the extensor carpi radialis longus to extensor carpi ulnaris with centralization of the extensor tendon, crossed intrinsic tendon transfers, synovectomy of the metacarpal phalangeal joints and possible reconstruction of radial collateral ligaments and or crossed intrinsic transfers middle ring and small fingers right hand. Prepare postoperative course been discussed along with risk and complications. She is aware there is no guarantee to the surgery possibility of infection recurrence injury to arteries nerves tendons incomplete relief symptoms dystrophy. This scheduled for her right hand as an outpatient under regional anesthesia. She is aware of the amount of therapy necessary following the surgery.     Cindee Salt 07/29/2020, 5:57 AM

## 2020-07-29 NOTE — Anesthesia Procedure Notes (Signed)
Anesthesia Regional Block: Supraclavicular block   Pre-Anesthetic Checklist: ,, timeout performed, Correct Patient, Correct Site, Correct Laterality, Correct Procedure, Correct Position, site marked, Risks and benefits discussed,  Surgical consent,  Pre-op evaluation,  At surgeon's request and post-op pain management  Laterality: Upper and Right  Prep: chloraprep       Needles:  Injection technique: Single-shot  Needle Type: Echogenic Stimulator Needle     Needle Length: 9cm  Needle Gauge: 20   Needle insertion depth: 2 cm   Additional Needles:   Procedures:,,,, ultrasound used (permanent image in chart),,,,  Narrative:  Start time: 07/29/2020 8:30 AM End time: 07/29/2020 8:40 AM Injection made incrementally with aspirations every 5 mL.  Performed by: Personally  Anesthesiologist: Leilani Able, MD

## 2020-07-29 NOTE — Discharge Instructions (Addendum)

## 2020-07-29 NOTE — Anesthesia Postprocedure Evaluation (Signed)
Anesthesia Post Note  Patient: Sydney [name]  Procedure(s) Performed: EXTENSOR CARPI RADIALIS LONGUS TRANSFER TO EXTENSOR CARPI ULNARIS, CENTRALIZATION EXTENSOR TENDONS RIGHT MIDDLE FINGER, RIGHT RING  FINGER, RIGHT SMALL FINGER, CROSS INTRINSIC TRANSFER RIGHT MIDDLE FINGER RIGHT RING FINGER AND RIGHT SMALL FINGER (Right Hand) Metacarpal phalangeal synovectomies (Right Hand)     Patient location during evaluation: Phase II Anesthesia Type: Regional and MAC Level of consciousness: awake Pain management: pain level controlled Vital Signs Assessment: post-procedure vital signs reviewed and stable Respiratory status: spontaneous breathing Cardiovascular status: stable Postop Assessment: no apparent nausea or vomiting Anesthetic complications: no   No complications documented.  Last Vitals:  Vitals:   07/29/20 1135 07/29/20 1145  BP: (!) 130/59 (!) 126/53  Pulse: 68 66  Resp: 18 17  Temp: 36.6 C   SpO2: 97% 98%    Last Pain:  Vitals:   07/29/20 1145  TempSrc:   PainSc: 0-No pain                 Huston Foley

## 2020-08-02 ENCOUNTER — Encounter (HOSPITAL_BASED_OUTPATIENT_CLINIC_OR_DEPARTMENT_OTHER): Payer: Self-pay | Admitting: Orthopedic Surgery

## 2021-01-24 ENCOUNTER — Other Ambulatory Visit: Payer: Self-pay | Admitting: Orthopedic Surgery

## 2021-03-16 ENCOUNTER — Other Ambulatory Visit: Payer: Self-pay

## 2021-03-16 ENCOUNTER — Encounter (HOSPITAL_BASED_OUTPATIENT_CLINIC_OR_DEPARTMENT_OTHER): Payer: Self-pay | Admitting: Orthopedic Surgery

## 2021-03-23 NOTE — Anesthesia Preprocedure Evaluation (Addendum)
Anesthesia Evaluation  Patient identified by MRN, date of birth, ID band Patient awake    Reviewed: Allergy & Precautions, NPO status , Patient's Chart, lab work & pertinent test results  Airway Mallampati: II  TM Distance: >3 FB Neck ROM: Full    Dental no notable dental hx. (+) Teeth Intact, Dental Advisory Given   Pulmonary neg pulmonary ROS,    Pulmonary exam normal breath sounds clear to auscultation       Cardiovascular hypertension, Normal cardiovascular exam Rhythm:Regular Rate:Normal     Neuro/Psych negative neurological ROS  negative psych ROS   GI/Hepatic Neg liver ROS, GERD  ,  Endo/Other  negative endocrine ROS  Renal/GU negative Renal ROS     Musculoskeletal  (+) Arthritis , Rheumatoid disorders,    Abdominal   Peds  Hematology   Anesthesia Other Findings   Reproductive/Obstetrics                            Anesthesia Physical Anesthesia Plan  ASA: 2  Anesthesia Plan: Regional   Post-op Pain Management: Regional block   Induction:   PONV Risk Score and Plan: 2 and Treatment may vary due to age or medical condition, Ondansetron and Midazolam  Airway Management Planned: Natural Airway and Simple Face Mask  Additional Equipment: None  Intra-op Plan:   Post-operative Plan:   Informed Consent: I have reviewed the patients History and Physical, chart, labs and discussed the procedure including the risks, benefits and alternatives for the proposed anesthesia with the patient or authorized representative who has indicated his/her understanding and acceptance.     Dental advisory given  Plan Discussed with: CRNA and Anesthesiologist  Anesthesia Plan Comments: (L superclavicular block )       Anesthesia Quick Evaluation

## 2021-03-24 ENCOUNTER — Ambulatory Visit (HOSPITAL_BASED_OUTPATIENT_CLINIC_OR_DEPARTMENT_OTHER): Payer: Medicare Other | Admitting: Anesthesiology

## 2021-03-24 ENCOUNTER — Encounter (HOSPITAL_BASED_OUTPATIENT_CLINIC_OR_DEPARTMENT_OTHER)
Admission: RE | Disposition: A | Discharge status: Home / Self Care | Payer: Self-pay | Source: Home / Self Care | Attending: Orthopedic Surgery

## 2021-03-24 ENCOUNTER — Ambulatory Visit (HOSPITAL_BASED_OUTPATIENT_CLINIC_OR_DEPARTMENT_OTHER)
Admission: RE | Admit: 2021-03-24 | Discharge: 2021-03-24 | Disposition: A | Discharge status: Home / Self Care | Payer: Medicare Other | Attending: Orthopedic Surgery | Admitting: Orthopedic Surgery

## 2021-03-24 ENCOUNTER — Encounter (HOSPITAL_BASED_OUTPATIENT_CLINIC_OR_DEPARTMENT_OTHER): Payer: Self-pay | Admitting: Orthopedic Surgery

## 2021-03-24 ENCOUNTER — Other Ambulatory Visit: Payer: Self-pay

## 2021-03-24 DIAGNOSIS — Z9889 Other specified postprocedural states: Secondary | ICD-10-CM | POA: Diagnosis not present

## 2021-03-24 DIAGNOSIS — M1990 Unspecified osteoarthritis, unspecified site: Secondary | ICD-10-CM | POA: Diagnosis not present

## 2021-03-24 DIAGNOSIS — Z79899 Other long term (current) drug therapy: Secondary | ICD-10-CM | POA: Insufficient documentation

## 2021-03-24 DIAGNOSIS — M069 Rheumatoid arthritis, unspecified: Secondary | ICD-10-CM | POA: Diagnosis present

## 2021-03-24 DIAGNOSIS — M24342 Pathological dislocation of left hand, not elsewhere classified: Secondary | ICD-10-CM | POA: Diagnosis not present

## 2021-03-24 DIAGNOSIS — I10 Essential (primary) hypertension: Secondary | ICD-10-CM | POA: Diagnosis not present

## 2021-03-24 DIAGNOSIS — K219 Gastro-esophageal reflux disease without esophagitis: Secondary | ICD-10-CM | POA: Diagnosis not present

## 2021-03-24 HISTORY — PX: INTRINSIC RELEASE: SHX6251

## 2021-03-24 HISTORY — DX: Gastro-esophageal reflux disease without esophagitis: K21.9

## 2021-03-24 HISTORY — DX: Tremor, unspecified: R25.1

## 2021-03-24 HISTORY — PX: TENDON TRANSFER: SHX6109

## 2021-03-24 SURGERY — TRANSFER, TENDON
Anesthesia: Regional | Site: Hand | Laterality: Left

## 2021-03-24 MED ORDER — FENTANYL CITRATE (PF) 100 MCG/2ML IJ SOLN
INTRAMUSCULAR | Status: AC
  Filled 2021-03-24: qty 2

## 2021-03-24 MED ORDER — LACTATED RINGERS IV SOLN: INTRAVENOUS | Status: DC

## 2021-03-24 MED ORDER — BUPIVACAINE HCL (PF) 0.25 % IJ SOLN
INTRAMUSCULAR | Status: AC
  Filled 2021-03-24: qty 30

## 2021-03-24 MED ORDER — CEFAZOLIN SODIUM-DEXTROSE 2-4 GM/100ML-% IV SOLN
INTRAVENOUS | Status: AC
  Filled 2021-03-24: qty 100

## 2021-03-24 MED ORDER — ONDANSETRON HCL 4 MG/2ML IJ SOLN
INTRAMUSCULAR | Status: DC | PRN
  Administered 2021-03-24: 4 mg via INTRAVENOUS

## 2021-03-24 MED ORDER — HYDROCODONE-ACETAMINOPHEN 5-325 MG PO TABS: 1.0000 | ORAL_TABLET | Freq: Four times a day (QID) | ORAL | 0 refills | Status: AC | PRN

## 2021-03-24 MED ORDER — DEXAMETHASONE SODIUM PHOSPHATE 10 MG/ML IJ SOLN
INTRAMUSCULAR | Status: AC
  Filled 2021-03-24: qty 1

## 2021-03-24 MED ORDER — CLONIDINE HCL (ANALGESIA) 100 MCG/ML EP SOLN
EPIDURAL | Status: DC | PRN
  Administered 2021-03-24: 100 ug

## 2021-03-24 MED ORDER — ONDANSETRON HCL 4 MG/2ML IJ SOLN: 4.0000 mg | Freq: Once | INTRAMUSCULAR | Status: DC | PRN

## 2021-03-24 MED ORDER — DEXMEDETOMIDINE (PRECEDEX) IN NS 20 MCG/5ML (4 MCG/ML) IV SYRINGE
PREFILLED_SYRINGE | INTRAVENOUS | Status: AC
  Filled 2021-03-24: qty 5

## 2021-03-24 MED ORDER — ACETAMINOPHEN 10 MG/ML IV SOLN: 1000.0000 mg | Freq: Once | INTRAVENOUS | Status: DC | PRN

## 2021-03-24 MED ORDER — CEFAZOLIN SODIUM-DEXTROSE 2-4 GM/100ML-% IV SOLN
2.0000 g | INTRAVENOUS | Status: AC
  Administered 2021-03-24: 2 g via INTRAVENOUS

## 2021-03-24 MED ORDER — FENTANYL CITRATE (PF) 100 MCG/2ML IJ SOLN
50.0000 ug | Freq: Once | INTRAMUSCULAR | Status: AC
  Administered 2021-03-24: 50 ug via INTRAVENOUS

## 2021-03-24 MED ORDER — MIDAZOLAM HCL 2 MG/2ML IJ SOLN
INTRAMUSCULAR | Status: AC
  Filled 2021-03-24: qty 2

## 2021-03-24 MED ORDER — FENTANYL CITRATE (PF) 100 MCG/2ML IJ SOLN: 25.0000 ug | INTRAMUSCULAR | Status: DC | PRN

## 2021-03-24 MED ORDER — FENTANYL CITRATE (PF) 100 MCG/2ML IJ SOLN
INTRAMUSCULAR | Status: DC | PRN
Start: 2021-03-24 — End: 2021-03-24
  Administered 2021-03-24: 25 ug via INTRAVENOUS

## 2021-03-24 MED ORDER — PROPOFOL 500 MG/50ML IV EMUL
INTRAVENOUS | Status: DC | PRN
  Administered 2021-03-24: 50 ug/kg/min via INTRAVENOUS

## 2021-03-24 MED ORDER — PROPOFOL 500 MG/50ML IV EMUL
INTRAVENOUS | Status: AC
  Filled 2021-03-24: qty 50

## 2021-03-24 MED ORDER — 0.9 % SODIUM CHLORIDE (POUR BTL) OPTIME
TOPICAL | Status: DC | PRN
  Administered 2021-03-24: 200 mL

## 2021-03-24 MED ORDER — ROPIVACAINE HCL 5 MG/ML IJ SOLN
INTRAMUSCULAR | Status: DC | PRN
  Administered 2021-03-24: 30 mL via PERINEURAL

## 2021-03-24 MED ORDER — ONDANSETRON HCL 4 MG/2ML IJ SOLN
INTRAMUSCULAR | Status: AC
  Filled 2021-03-24: qty 2

## 2021-03-24 MED ORDER — LIDOCAINE 2% (20 MG/ML) 5 ML SYRINGE
INTRAMUSCULAR | Status: AC
  Filled 2021-03-24: qty 5

## 2021-03-24 SURGICAL SUPPLY — 79 items
APL PRP STRL LF DISP 70% ISPRP (MISCELLANEOUS) ×1
BAG DECANTER FOR FLEXI CONT (MISCELLANEOUS) IMPLANT
BALL CTTN LRG ABS STRL LF (GAUZE/BANDAGES/DRESSINGS)
BLADE MINI RND TIP GREEN BEAV (BLADE) ×1 IMPLANT
BLADE SURG 15 STRL LF DISP TIS (BLADE) ×1 IMPLANT
BLADE SURG 15 STRL SS (BLADE) ×2
BNDG CMPR 9X4 STRL LF SNTH (GAUZE/BANDAGES/DRESSINGS) ×1
BNDG COHESIVE 3X5 TAN ST LF (GAUZE/BANDAGES/DRESSINGS) ×2 IMPLANT
BNDG ESMARK 4X9 LF (GAUZE/BANDAGES/DRESSINGS) ×1 IMPLANT
BNDG GAUZE ELAST 4 BULKY (GAUZE/BANDAGES/DRESSINGS) ×2 IMPLANT
CHLORAPREP W/TINT 26 (MISCELLANEOUS) ×2 IMPLANT
CORD BIPOLAR FORCEPS 12FT (ELECTRODE) ×2 IMPLANT
COTTONBALL LRG STERILE PKG (GAUZE/BANDAGES/DRESSINGS) IMPLANT
COVER BACK TABLE 60X90IN (DRAPES) ×2 IMPLANT
COVER MAYO STAND STRL (DRAPES) ×2 IMPLANT
CUFF TOURN SGL QUICK 18 NS (TOURNIQUET CUFF) ×1 IMPLANT
CUFF TOURN SGL QUICK 18X4 (TOURNIQUET CUFF) IMPLANT
DECANTER SPIKE VIAL GLASS SM (MISCELLANEOUS) IMPLANT
DRAPE EXTREMITY T 121X128X90 (DISPOSABLE) ×2 IMPLANT
DRAPE OEC MINIVIEW 54X84 (DRAPES) IMPLANT
DRAPE SURG 17X23 STRL (DRAPES) ×1 IMPLANT
DRAPE U-SHAPE 47X51 STRL (DRAPES) ×1 IMPLANT
DRSG PAD ABDOMINAL 8X10 ST (GAUZE/BANDAGES/DRESSINGS) IMPLANT
GAUZE 4X4 16PLY ~~LOC~~+RFID DBL (SPONGE) ×1 IMPLANT
GAUZE SPONGE 4X4 12PLY STRL (GAUZE/BANDAGES/DRESSINGS) ×2 IMPLANT
GAUZE XEROFORM 1X8 LF (GAUZE/BANDAGES/DRESSINGS) ×2 IMPLANT
GLOVE SRG 8 PF TXTR STRL LF DI (GLOVE) IMPLANT
GLOVE SURG ENC MOIS LTX SZ7.5 (GLOVE) ×1 IMPLANT
GLOVE SURG ORTHO LTX SZ8 (GLOVE) ×3 IMPLANT
GLOVE SURG POLYISO LF SZ7 (GLOVE) ×1 IMPLANT
GLOVE SURG UNDER POLY LF SZ7 (GLOVE) ×2 IMPLANT
GLOVE SURG UNDER POLY LF SZ8 (GLOVE) ×2
GLOVE SURG UNDER POLY LF SZ8.5 (GLOVE) ×3 IMPLANT
GOWN STRL REUS W/ TWL LRG LVL3 (GOWN DISPOSABLE) ×1 IMPLANT
GOWN STRL REUS W/TWL LRG LVL3 (GOWN DISPOSABLE) ×2
GOWN STRL REUS W/TWL XL LVL3 (GOWN DISPOSABLE) ×3 IMPLANT
K-WIRE .035X4 (WIRE) IMPLANT
LOOP VESSEL MAXI BLUE (MISCELLANEOUS) IMPLANT
NDL HYPO 27GX1-1/4 (NEEDLE) IMPLANT
NDL KEITH (NEEDLE) IMPLANT
NEEDLE HYPO 22GX1.5 SAFETY (NEEDLE) IMPLANT
NEEDLE HYPO 27GX1-1/4 (NEEDLE) IMPLANT
NEEDLE KEITH (NEEDLE) IMPLANT
NS IRRIG 1000ML POUR BTL (IV SOLUTION) ×2 IMPLANT
PACK BASIN DAY SURGERY FS (CUSTOM PROCEDURE TRAY) ×2 IMPLANT
PAD CAST 3X4 CTTN HI CHSV (CAST SUPPLIES) ×1 IMPLANT
PADDING CAST ABS 3INX4YD NS (CAST SUPPLIES)
PADDING CAST ABS 4INX4YD NS (CAST SUPPLIES)
PADDING CAST ABS COTTON 3X4 (CAST SUPPLIES) IMPLANT
PADDING CAST ABS COTTON 4X4 ST (CAST SUPPLIES) ×1 IMPLANT
PADDING CAST COTTON 3X4 STRL (CAST SUPPLIES) ×2
SLEEVE SCD COMPRESS KNEE MED (STOCKING) IMPLANT
SLING ARM FOAM STRAP MED (SOFTGOODS) ×2 IMPLANT
SPLINT PLASTER CAST XFAST 3X15 (CAST SUPPLIES) IMPLANT
SPLINT PLASTER XTRA FASTSET 3X (CAST SUPPLIES) ×20
STOCKINETTE 4X48 STRL (DRAPES) ×2 IMPLANT
SUT CHROMIC 5 0 P 3 (SUTURE) ×1 IMPLANT
SUT ETHIBOND 3-0 V-5 (SUTURE) IMPLANT
SUT ETHILON 4 0 PS 2 18 (SUTURE) ×4 IMPLANT
SUT FIBERWIRE 2-0 18 17.9 3/8 (SUTURE)
SUT FIBERWIRE 4-0 18 TAPR NDL (SUTURE)
SUT MERSILENE 2.0 SH NDLE (SUTURE) IMPLANT
SUT MERSILENE 4 0 P 3 (SUTURE) ×4 IMPLANT
SUT PROLENE 2 0 SH DA (SUTURE) IMPLANT
SUT SILK 2 0 PERMA HAND 18 BK (SUTURE) IMPLANT
SUT SILK 4 0 PS 2 (SUTURE) IMPLANT
SUT STEEL 3 0 (SUTURE) IMPLANT
SUT VIC AB 3-0 PS1 18 (SUTURE)
SUT VIC AB 3-0 PS1 18XBRD (SUTURE) IMPLANT
SUT VIC AB 4-0 P-3 18XBRD (SUTURE) IMPLANT
SUT VIC AB 4-0 P3 18 (SUTURE)
SUT VICRYL 4-0 PS2 18IN ABS (SUTURE) IMPLANT
SUTURE FIBERWR 2-0 18 17.9 3/8 (SUTURE) IMPLANT
SUTURE FIBERWR 4-0 18 TAPR NDL (SUTURE) IMPLANT
SYR BULB EAR ULCER 3OZ GRN STR (SYRINGE) ×2 IMPLANT
SYR CONTROL 10ML LL (SYRINGE) IMPLANT
TOWEL GREEN STERILE FF (TOWEL DISPOSABLE) ×3 IMPLANT
TUBE FEEDING ENTERAL 5FR 16IN (TUBING) IMPLANT
UNDERPAD 30X36 HEAVY ABSORB (UNDERPADS AND DIAPERS) ×2 IMPLANT

## 2021-03-24 NOTE — Op Note (Signed)
NAME: Sydney [name] MEDICAL RECORD NO: [id number] DATE OF BIRTH: [birth year] FACILITY: [facility] LOCATION: [facility] PHYSICIAN: Nicki Reaper, MD   OPERATIVE REPORT   DATE OF PROCEDURE: 03/24/21    PREOPERATIVE DIAGNOSIS: Rheumatoid arthritis left hand with ulnar deviation subluxation extensor tendon middle ring and small finger needle deviation of the wrist   POSTOPERATIVE DIAGNOSIS: Same   PROCEDURE: Synovectomy metacarpal phalangeal joints middle ring and small fingers with centralization extensor tendons across intrinsic transfers middle ring and small with release abductor digiti quinti and transfer of ECRL to ECU tendon left hand and wrist   SURGEON: Cindee Salt, M.D.   ASSISTANT: Betha Loa, MD   ANESTHESIA:  Regional with sedation   INTRAVENOUS FLUIDS:  Per anesthesia flow sheet.   ESTIMATED BLOOD LOSS:  Minimal.   COMPLICATIONS:  None.   SPECIMENS:  none   TOURNIQUET TIME:    Total Tourniquet Time Documented: Upper Arm (Left) - 86 minutes Total: Upper Arm (Left) - 86 minutes    DISPOSITION:  Stable to PACU.   INDICATIONS: Patient is a 76 year old female with a history of subluxation extensor tendons radial deviation of her wrist with rheumatoid arthritis.  She has undergone reconstruction on her right side is admitted now for reconstruction of the left hand.  Pre-.  Postoperative course been discussed along with risks and complications.  She is aware that there is no guarantee to the surgery possibility of infection recurrence injury to arteries nerves tendons incomplete relief symptoms dystrophy possibility of recurrence of her deformities.  In preoperative area the patient is seen extremity marked by both patient and surgeon antibiotic given.  This concluded supraclavicular block was carried out without difficulty under the direction the anesthesia department.  OPERATIVE COURSE: Patient is brought to the operating room placed in a supine position  with left arm free.  Prep was done with ChloraPrep.  A 3-minute dry time was allowed and timeout taken confirm patient procedure.  The limb was exsanguinated with an Esmarch bandage turn placed high in the arm was inflated to 250 mmHg.  A transverse incision was made over the metacarpal phalangeal joints index through small fingers carried down through subcutaneous tissue.  Bleeders were electrocauterized with bipolar.  The neurovascular structures between each of the metacarpal heads was preserved the dissection was carried from small to middle fingers releasing the abductor digiti quinti sizing the sagittal fibers on the ulnar aspect of the tendons which were subluxated below the center of rotation of the metacarpal head on the small finger.  The dissection was carried back between the metacarpal heads of the ring and small fingers isolating the lateral lateral digital band ulnarly of the ring finger this was transected distally taking care to protect neurovascular bundles beneath it.  This was tagged.  An incision was then made through the radial sagittal fibers of the small finger the joint was opened and synovectomy performed using a small rondure.  The capsule was then closed with a running 5-0 chromic suture.  The ring finger was attended to next.  The sagittal fibers ulnarly were incised and the incision made on the radial sagittal fibers.  The ulnar lateral band was then isolated from the middle finger the neurovascular bundles were again protected and the intrinsics on the ulnar side where then released distally.  Care was taken to protect the central slip and the neurovascular bundles beneath this was tagged with 4-0 Mersilene sutures the middle finger was attended to next the sagittal  band on the ulnar side was released to centralize the tendon the dissection was carried on the index finger isolating the interosseous ulnarly was transected distally protecting neurovascular bundles and central slip.  This  was tacked with 4-0 Mersilene sutures.  An incision was then made on the radial side of the digital bands of the joint open to the middle finger metacarpal phalangeal joint a synovectomy performed moderate erosions were present on the dorsal capsule and head of the metacarpal.  This was debrided with a rondure and the capsule closed with a running 5-0 chromic sutures.  The radial sagittal fibers were incised and repaired with a pants over vest centralizing the extensor tendon central slip and the index ulnar lateral band was then woven through the central tendon and sutured with 4-0 Mersilene sutures on the middle finger.  This centralized the tendon of the middle finger.  Ring finger was attended to next a lateral band from the middle finger was mobilized incision made on the sagittal fibers of the joint opened a synovectomy performed again nerve erosions were present dorsally.  The capsule was closed with a running 5-0 chromic sutures.  The radial sagittal fibers were then imbricated pants over vest manner and the interosseous from the middle finger was then woven through the central tendon and sutured in position with 4-0 Mersilene sutures.  The small finger was then attended a pants over vest patient of the radial sagittal fibers was performed after the joint was opened and synovectomy performed with the capsule closed with 5-0 chromic sutures.  He has over vest that was stabilized with 4-0 Mersilene sutures and the intrinsics of the ring finger ulnar side were then woven through the central slip of the small finger stabilizing it over the metacarpal head.  The wound was copiously irrigated with saline and the skin was closed with interrupted 4-0 nylon sutures.  The tendon transfers for the extensors was then performed an incision was made made at the base of the index metacarpal the extensor carpi radialis longus was identified and released from its insertion take care to protect neurovascular structures.  A  separate incision was then made over the distal radius the ECRL was identified proximally brought from the extensor retinaculum and released proximally.  The tendon was then delivered proximally and the incision made over the metacarpal base of the index finger closed with interrupted 4-0 nylon sutures after irrigation.  A separate incision was then noted over the extensor carpi ulnaris down through subcutaneous tissue protecting the dorsal sensory branch of the ulnar nerve the tendon was identified the ECRL was then transferred from the needle to the ulnar side of her wrist subcutaneously the incision on the radial side of the wrist was closed interrupted 4 nylon sutures.  The ECRL was then woven through the ECU and sutured with multiple horizontal mattress 4-0 Mersilene sutures.  This held the wrist and more ulnar deviation also stabilize the distal ulna wound was irrigated and closed interrupted 4-0 nylon sutures.  A sterile compressive dressing dorsal palmar splint was applied.  And deflation of the tourniquet all fingers immediately pink.  She was taken to the recovery room for observation in satisfactory condition.  She will be discharged home to return [facility] hand [facility] in 1 week on Tylenol ibuprofen for pain with Norco for breakthrough.   Cindee Salt, MD Electronically signed, 03/24/21

## 2021-03-24 NOTE — Progress Notes (Signed)
Assisted Dr. Houser with left, ultrasound guided, supraclavicular block. Side rails up, monitors on throughout procedure. See vital signs in flow sheet. Tolerated Procedure well. °

## 2021-03-24 NOTE — Brief Op Note (Signed)
03/24/2021  10:28 AM  PATIENT:  Sydney [name]  76 y.o. female  PRE-OPERATIVE DIAGNOSIS:  RHEUMATOID ARTHRITIS LEFT HAND  POST-OPERATIVE DIAGNOSIS:  RHEUMATOID ARTHRITIS LEFT HAND  PROCEDURE:  Procedure(s): EXTENSOR CARPI RADIALIS LONGUS TO EXTENSOR CARPI ULNARiS TRANSFER, CENTRALIZATION EXTENSOR TENDONS LEFT MIDDLE ,RING, AND SMALL FINGER (Left) CROSS INTRINSIC TRANSFERS LEFT MIDDLE , RING, AND SMALL FINGERS LEFT HAND (Left)  SURGEON:  Surgeon(s) and Role:    * Cindee Salt, MD - Primary    * Betha Loa, MD - Assisting  PHYSICIAN ASSISTANT:   ASSISTANTS: K Blayke Cordrey,MD   ANESTHESIA:   regional and IV sedation  EBL:  5 mL   BLOOD ADMINISTERED:none  DRAINS: none   LOCAL MEDICATIONS USED:  NONE  SPECIMEN:  No Specimen  DISPOSITION OF SPECIMEN:  N/A  COUNTS:  YES  TOURNIQUET:   Total Tourniquet Time Documented: Upper Arm (Left) - 86 minutes Total: Upper Arm (Left) - 86 minutes   DICTATION: .Reubin Milan Dictation  PLAN OF CARE: Discharge to home after PACU  PATIENT DISPOSITION:  PACU - hemodynamically stable.

## 2021-03-24 NOTE — H&P (Signed)
Sydney [name] is an 76 y.o. female.   Chief Complaint: Subluxation extensor tendons left hand with RA HPI: Sydney [name] is a 75-year and hand dominant female referred by Dr. Janee Morn for catching of her middle ring and small fingers bilaterally. The right has been going on for approximately 6 months the left for a month. She recalls no history of injury. She has been worked up for rheumatoid arthritis. She is not planing significant pain on her left side she is complaining of mild discomfort with attempted straightening of her right side middle ring and small fingers. She is not planing of any numbness or tingling. She has not had any injury to either side. She has not had any treatment for this. She has no history of diabetes thyroid problems or gout. Family history is negative for each of these she states her right hand is becoming relatively useless. She complains of the catching on her right side began as her left side is now. She has been referred to rheumatology. They have not decided on starting her on any medicines at the present time. We had discussed possibility of surgical intervention. She was started on methotrexate for her arthritis. She has been diagnosed with rheumatoid arthritis  She is post op reconstruction rheumatoid arthritis right hand wrist including ECRL to ECU tendon transfer centralization of the extensor tendons middle ring and small fingers and cross intrinsic transfers. She is now approximately 6 months following that surgery. She is very pleased with her right hand result. She continues to have the ulnar deviation of the fingers with radial deviation of the wrist on her left side. He is not complaining of significant pain or discomfort. She is on methotrexate folic acid for her rheumatoid arthritis. 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   Seasonal allergies    Thyroid nodule    Tremors of nervous system    on propranolol   Vertigo     Past Surgical History:  Procedure Laterality Date   BALLOON DILATION N/A 06/14/2017   Procedure: BALLOON DILATION;  Surgeon: Karie Soda, MD;  Location: [facility];  Service: Gastroenterology;  Laterality: N/A;   CATARACT EXTRACTION, BILATERAL  2016   CHOLECYSTECTOMY     ESOPHAGEAL MANOMETRY N/A 01/03/2017   Procedure: ESOPHAGEAL MANOMETRY (EM);  Surgeon: Napoleon Form, MD;  Location: [facility];  Service: Endoscopy;  Laterality: N/A;   ESOPHAGEAL STENT PLACEMENT  04/13/2017   Procedure: ESOPHAGEAL STENT PLACEMENT;  Surgeon: Karie Soda, MD;  Location: [facility];  Service: General;;   ESOPHAGOGASTRODUODENOSCOPY N/A 04/12/2017   Procedure: ESOPHAGOGASTRODUODENOSCOPY (EGD);  Surgeon: Rachael Fee, MD;  Location: [facility];  Service: Endoscopy;  Laterality: N/A;   ESOPHAGOGASTRODUODENOSCOPY  04/13/2017   Procedure: ESOPHAGOGASTRODUODENOSCOPY (EGD) WITH BALLOON DILATION;  Surgeon: Karie Soda, MD;  Location: [facility];  Service: General;;   ESOPHAGOGASTRODUODENOSCOPY (EGD) WITH PROPOFOL N/A 06/14/2017   Procedure: ESOPHAGOGASTRODUODENOSCOPY (EGD) WITH PROPOFOL;  Surgeon: Karie Soda, MD;  Location: [facility];  Service: Gastroenterology;  Laterality: N/A;   FOREIGN BODY RETRIEVAL N/A 04/13/2017   Procedure: EXCISION OF FOREIGN BODY X ESOPHAGUS ;  Surgeon: Karie Soda, MD;  Location: [facility];  Service: General;  Laterality: N/A;   GASTROINTESTINAL STENT REMOVAL N/A 06/14/2017   Procedure: GASTROINTESTINAL STENT  REMOVAL;  Surgeon: Karie Soda, MD;  Location: [facility];  Service: Gastroenterology;  Laterality: N/A;  esopheageal stent removal   INSERTION OF MESH N/A 01/31/2017   Procedure: INSERTION OF MESH;  Surgeon: Karie Soda, MD;  Location: [facility];   Service: General;  Laterality: N/A;   LAPAROSCOPIC NISSEN FUNDOPLICATION N/A 04/13/2017   Procedure: Laproscopic lysis of Adhesions;  Surgeon: Karie Soda, MD;  Location: [facility];  Service: General;  Laterality: N/A;   LAPAROSCOPIC ROUX-EN-Y GASTRIC BYPASS WITH UPPER ENDOSCOPY AND REMOVAL OF LAP BAND  04/13/2017   Procedure: LAPAROSCOPIC GASTRIC  EXPLORATION;  Surgeon: Karie Soda, MD;  Location: [facility];  Service: General;;   MOUTH SURGERY     STERIOD INJECTION  04/13/2017   Procedure: STEROID INJECTION ESOPHAGUS ;  Surgeon: Karie Soda, MD;  Location: [facility];  Service: General;;   SUBMUCOSAL INJECTION N/A 06/14/2017   Procedure: SUBMUCOSAL INJECTION;  Surgeon: Karie Soda, MD;  Location: [facility];  Service: Gastroenterology;  Laterality: N/A;  STEROID INJECTION   SYNOVECTOMY Right 07/29/2020   Procedure: Metacarpal phalangeal synovectomies;  Surgeon: Cindee Salt, MD;  Location: [facility];  Service: Orthopedics;  Laterality: Right;   TENDON TRANSFER Right 07/29/2020   Procedure: EXTENSOR CARPI RADIALIS LONGUS TRANSFER TO EXTENSOR CARPI ULNARIS, CENTRALIZATION EXTENSOR TENDONS RIGHT MIDDLE FINGER, RIGHT RING  FINGER, RIGHT SMALL FINGER, CROSS INTRINSIC TRANSFER RIGHT MIDDLE FINGER RIGHT RING FINGER AND RIGHT SMALL FINGER;  Surgeon: Cindee Salt, MD;  Location: [facility];  Service: Orthopedics;  Laterality: Right;  AXILLARY BLOCK   TUBAL LIGATION      Family History  Problem Relation Age of Onset   Hypertension Mother    Macular degeneration Mother        legally blind   Heart Problems Mother        skips a beat   Heart attack Father    Leukemia Father    Colon cancer Neg Hx    Rectal cancer Neg Hx    Throat cancer Neg Hx    Esophageal cancer Neg Hx    Stomach cancer Neg Hx    Social History:  reports that she has never smoked. She has never used smokeless tobacco. She reports that she does not drink alcohol and does not use drugs.  Allergies: No Known  Allergies  Medications Prior to Admission  Medication Sig Dispense Refill   acetaminophen (TYLENOL) 500 MG tablet Take 1,000 mg by mouth every 4 (four) hours as needed for mild pain or headache.      folic acid (FOLVITE) 1 MG tablet Take 1 mg by mouth daily.     methotrexate (RHEUMATREX) 2.5 MG tablet Take 2.5 mg by mouth once a week. Caution:Chemotherapy. Protect from light. PATIENT TAKES 6TABS WEEKLY     propranolol (INDERAL) 60 MG tablet Take 60 mg by mouth daily.      No results found for this or any previous visit (from the past 48 hour(s)).  No results found.   Pertinent items are noted in HPI.  Blood pressure (!) 153/66, pulse (!) 58, temperature (!) 97.2 F (36.2 C), temperature source Oral, resp. rate 12, height  (1.473 m), weight 64 kg, SpO2 100 %.  General appearance: alert, cooperative, and appears stated age Head: Normocephalic, without obvious abnormality Neck: no JVD Resp: clear to auscultation bilaterally Cardio: regular rate and rhythm, S1, S2 normal, no murmur, click, rub or gallop GI: soft, non-tender; bowel sounds normal; no masses,  no organomegaly Extremities: Subluxation extensor  tendons left hand Pulses: 2+ and symmetric Skin: Skin color, texture, turgor normal. No rashes or lesions Neurologic: Grossly normal Incision/Wound: na  Assessment/Plan Diagnosed rheumatoid arthritis left hand Plan: She would like to proceed to have her left hand operated on the same procedure done on the right cross intrinsic transfer centralization of extensor tendons and ECRL to ECU tendon transfer left wrist. This will be scheduled as an outpatient under regional anesthesia. Preperi-and postoperative course are discussed. She is aware there is no guarantee to the surgery possibility of infection recurrence injury to arteries nerves tendons complete relief symptoms dystrophy.  Cindee SaltGary Hobart Marte 03/24/2021, 8:33 AM

## 2021-03-24 NOTE — Anesthesia Postprocedure Evaluation (Signed)
Anesthesia Post Note  Patient: Geradine GirtNancy H Dissinger  Procedure(s) Performed: EXTENSOR CARPI RADIALIS LONGUS TO EXTENSOR CARPI ULNARiS TRANSFER, CENTRALIZATION EXTENSOR TENDONS LEFT MIDDLE ,RING, AND SMALL FINGER (Left: Hand) CROSS INTRINSIC TRANSFERS LEFT MIDDLE , RING, AND SMALL FINGERS LEFT HAND (Left: Hand)     Patient location during evaluation: PACU Anesthesia Type: Regional Level of consciousness: awake and alert Pain management: pain level controlled Vital Signs Assessment: post-procedure vital signs reviewed and stable Respiratory status: spontaneous breathing, nonlabored ventilation, respiratory function stable and patient connected to nasal cannula oxygen Cardiovascular status: stable and blood pressure returned to baseline Postop Assessment: no apparent nausea or vomiting Anesthetic complications: no   No notable events documented.  Last Vitals:  Vitals:   03/24/21 1130 03/24/21 1145  BP: (!) 163/80 (!) 155/62  Pulse: (!) 54 (!) 55  Resp: 14 16  Temp:  36.6 C  SpO2: 95% 97%    Last Pain:  Vitals:   03/24/21 1145  TempSrc:   PainSc: 0-No pain                 Trevor IhaStephen A Cherith Tewell

## 2021-03-24 NOTE — Transfer of Care (Signed)
Immediate Anesthesia Transfer of Care Note  Patient: Sydney [name]  Procedure(s) Performed: EXTENSOR CARPI RADIALIS LONGUS TO EXTENSOR CARPI ULNARiS TRANSFER, CENTRALIZATION EXTENSOR TENDONS LEFT MIDDLE ,RING, AND SMALL FINGER (Left: Hand) CROSS INTRINSIC TRANSFERS LEFT MIDDLE , RING, AND SMALL FINGERS LEFT HAND (Left: Hand)  Patient Location: PACU  Anesthesia Type:MAC combined with regional for post-op pain  Level of Consciousness: awake  Airway & Oxygen Therapy: Patient Spontanous Breathing and Patient connected to face mask oxygen  Post-op Assessment: Report given to RN and Post -op Vital signs reviewed and stable  Post vital signs: Reviewed and stable  Last Vitals:  Vitals Value Taken Time  BP    Temp    Pulse 60 03/24/21 1032  Resp 17 03/24/21 1032  SpO2 98 % 03/24/21 1032  Vitals shown include unvalidated device data.  Last Pain:  Vitals:   03/24/21 0654  TempSrc: Oral  PainSc: 0-No pain      Patients Stated Pain Goal: 1 (22/63/33 [id number])  Complications: No notable events documented.

## 2021-03-24 NOTE — Anesthesia Procedure Notes (Signed)
Anesthesia Regional Block: Supraclavicular block   Pre-Anesthetic Checklist: , timeout performed,  Correct Patient, Correct Site, Correct Laterality,  Correct Procedure, Correct Position, site marked,  Risks and benefits discussed,  Surgical consent,  Pre-op evaluation,  At surgeon's request and post-op pain management  Laterality: Left and Upper  Prep: chloraprep       Needles:  Injection technique: Single-shot  Needle Type: Echogenic Needle     Needle Length: 5cm  Needle Gauge: 21     Additional Needles:   Procedures:,,,, ultrasound used (permanent image in chart),,    Narrative:  Start time: 03/24/2021 7:54 AM End time: 03/24/2021 7:59 AM Injection made incrementally with aspirations every 5 mL.  Performed by: Personally  Anesthesiologist: Trevor Iha, MD

## 2021-03-24 NOTE — Discharge Instructions (Addendum)

## 2021-03-24 NOTE — Op Note (Signed)
I assisted Surgeon(s) and Role:    * Cindee SaltKuzma, Gary, MD - Primary    * Betha LoaKuzma, Alben Jepsen, MD - Assisting on the Procedure(s): EXTENSOR CARPI RADIALIS LONGUS TO EXTENSOR CARPI ULNARiS TRANSFER, CENTRALIZATION EXTENSOR TENDONS LEFT MIDDLE ,RING, AND SMALL FINGER CROSS INTRINSIC TRANSFERS LEFT MIDDLE , RING, AND SMALL FINGERS LEFT HAND on 03/24/2021.  I provided assistance on this case as follows: retraction soft tissues, closure of wounds.  Electronically signed by: Betha LoaKevin Armonie Staten, MD Date: 03/24/2021 Time: 10:33 AM

## 2021-03-25 ENCOUNTER — Encounter (HOSPITAL_BASED_OUTPATIENT_CLINIC_OR_DEPARTMENT_OTHER): Payer: Self-pay | Admitting: Orthopedic Surgery

## 2021-08-30 DIAGNOSIS — M0609 Rheumatoid arthritis without rheumatoid factor, multiple sites: Secondary | ICD-10-CM | POA: Diagnosis not present

## 2021-10-20 DIAGNOSIS — M069 Rheumatoid arthritis, unspecified: Secondary | ICD-10-CM | POA: Diagnosis not present

## 2021-10-20 DIAGNOSIS — K2100 Gastro-esophageal reflux disease with esophagitis, without bleeding: Secondary | ICD-10-CM | POA: Diagnosis not present

## 2021-10-20 DIAGNOSIS — D649 Anemia, unspecified: Secondary | ICD-10-CM | POA: Diagnosis not present

## 2021-10-20 DIAGNOSIS — I10 Essential (primary) hypertension: Secondary | ICD-10-CM | POA: Diagnosis not present

## 2021-10-25 DIAGNOSIS — M069 Rheumatoid arthritis, unspecified: Secondary | ICD-10-CM | POA: Diagnosis not present

## 2021-10-25 DIAGNOSIS — G250 Essential tremor: Secondary | ICD-10-CM | POA: Diagnosis not present

## 2021-10-25 DIAGNOSIS — K2100 Gastro-esophageal reflux disease with esophagitis, without bleeding: Secondary | ICD-10-CM | POA: Diagnosis not present

## 2021-11-01 DIAGNOSIS — Z1231 Encounter for screening mammogram for malignant neoplasm of breast: Secondary | ICD-10-CM | POA: Diagnosis not present

## 2021-11-02 DIAGNOSIS — M1991 Primary osteoarthritis, unspecified site: Secondary | ICD-10-CM | POA: Diagnosis not present

## 2021-11-02 DIAGNOSIS — Z79899 Other long term (current) drug therapy: Secondary | ICD-10-CM | POA: Diagnosis not present

## 2021-11-02 DIAGNOSIS — M0609 Rheumatoid arthritis without rheumatoid factor, multiple sites: Secondary | ICD-10-CM | POA: Diagnosis not present

## 2022-01-30 DIAGNOSIS — M0609 Rheumatoid arthritis without rheumatoid factor, multiple sites: Secondary | ICD-10-CM | POA: Diagnosis not present

## 2022-02-08 DIAGNOSIS — K219 Gastro-esophageal reflux disease without esophagitis: Secondary | ICD-10-CM | POA: Diagnosis not present

## 2022-02-08 DIAGNOSIS — R1310 Dysphagia, unspecified: Secondary | ICD-10-CM | POA: Diagnosis not present

## 2022-03-21 DIAGNOSIS — R1310 Dysphagia, unspecified: Secondary | ICD-10-CM | POA: Diagnosis not present

## 2022-03-21 DIAGNOSIS — K222 Esophageal obstruction: Secondary | ICD-10-CM | POA: Diagnosis not present

## 2022-04-20 DIAGNOSIS — I10 Essential (primary) hypertension: Secondary | ICD-10-CM | POA: Diagnosis not present

## 2022-04-20 DIAGNOSIS — E039 Hypothyroidism, unspecified: Secondary | ICD-10-CM | POA: Diagnosis not present

## 2022-04-20 DIAGNOSIS — E7849 Other hyperlipidemia: Secondary | ICD-10-CM | POA: Diagnosis not present

## 2022-04-20 DIAGNOSIS — K2100 Gastro-esophageal reflux disease with esophagitis, without bleeding: Secondary | ICD-10-CM | POA: Diagnosis not present

## 2022-04-20 DIAGNOSIS — Z0001 Encounter for general adult medical examination with abnormal findings: Secondary | ICD-10-CM | POA: Diagnosis not present

## 2022-04-25 DIAGNOSIS — G250 Essential tremor: Secondary | ICD-10-CM | POA: Diagnosis not present

## 2022-04-25 DIAGNOSIS — R030 Elevated blood-pressure reading, without diagnosis of hypertension: Secondary | ICD-10-CM | POA: Diagnosis not present

## 2022-04-25 DIAGNOSIS — Z0001 Encounter for general adult medical examination with abnormal findings: Secondary | ICD-10-CM | POA: Diagnosis not present

## 2022-04-25 DIAGNOSIS — K2100 Gastro-esophageal reflux disease with esophagitis, without bleeding: Secondary | ICD-10-CM | POA: Diagnosis not present

## 2022-04-25 DIAGNOSIS — M069 Rheumatoid arthritis, unspecified: Secondary | ICD-10-CM | POA: Diagnosis not present

## 2022-04-25 DIAGNOSIS — Z23 Encounter for immunization: Secondary | ICD-10-CM | POA: Diagnosis not present

## 2022-05-09 DIAGNOSIS — M1991 Primary osteoarthritis, unspecified site: Secondary | ICD-10-CM | POA: Diagnosis not present

## 2022-05-09 DIAGNOSIS — M0609 Rheumatoid arthritis without rheumatoid factor, multiple sites: Secondary | ICD-10-CM | POA: Diagnosis not present

## 2022-05-09 DIAGNOSIS — Z79899 Other long term (current) drug therapy: Secondary | ICD-10-CM | POA: Diagnosis not present

## 2022-08-09 DIAGNOSIS — M0609 Rheumatoid arthritis without rheumatoid factor, multiple sites: Secondary | ICD-10-CM | POA: Diagnosis not present

## 2022-08-12 DIAGNOSIS — J019 Acute sinusitis, unspecified: Secondary | ICD-10-CM | POA: Diagnosis not present

## 2022-08-12 DIAGNOSIS — S30860A Insect bite (nonvenomous) of lower back and pelvis, initial encounter: Secondary | ICD-10-CM | POA: Diagnosis not present

## 2022-10-17 DIAGNOSIS — I10 Essential (primary) hypertension: Secondary | ICD-10-CM | POA: Diagnosis not present

## 2022-10-17 DIAGNOSIS — E7849 Other hyperlipidemia: Secondary | ICD-10-CM | POA: Diagnosis not present

## 2022-10-17 DIAGNOSIS — E039 Hypothyroidism, unspecified: Secondary | ICD-10-CM | POA: Diagnosis not present

## 2022-10-17 DIAGNOSIS — E559 Vitamin D deficiency, unspecified: Secondary | ICD-10-CM | POA: Diagnosis not present

## 2022-10-24 DIAGNOSIS — Z23 Encounter for immunization: Secondary | ICD-10-CM | POA: Diagnosis not present

## 2022-10-24 DIAGNOSIS — G250 Essential tremor: Secondary | ICD-10-CM | POA: Diagnosis not present

## 2022-10-24 DIAGNOSIS — K2100 Gastro-esophageal reflux disease with esophagitis, without bleeding: Secondary | ICD-10-CM | POA: Diagnosis not present

## 2022-10-24 DIAGNOSIS — R030 Elevated blood-pressure reading, without diagnosis of hypertension: Secondary | ICD-10-CM | POA: Diagnosis not present

## 2022-10-24 DIAGNOSIS — M069 Rheumatoid arthritis, unspecified: Secondary | ICD-10-CM | POA: Diagnosis not present

## 2022-10-24 DIAGNOSIS — E559 Vitamin D deficiency, unspecified: Secondary | ICD-10-CM | POA: Diagnosis not present

## 2022-11-09 DIAGNOSIS — H43393 Other vitreous opacities, bilateral: Secondary | ICD-10-CM | POA: Diagnosis not present

## 2022-11-09 DIAGNOSIS — H524 Presbyopia: Secondary | ICD-10-CM | POA: Diagnosis not present

## 2022-11-16 DIAGNOSIS — M0609 Rheumatoid arthritis without rheumatoid factor, multiple sites: Secondary | ICD-10-CM | POA: Diagnosis not present

## 2022-11-16 DIAGNOSIS — Z79899 Other long term (current) drug therapy: Secondary | ICD-10-CM | POA: Diagnosis not present

## 2022-11-16 DIAGNOSIS — R21 Rash and other nonspecific skin eruption: Secondary | ICD-10-CM | POA: Diagnosis not present

## 2022-11-16 DIAGNOSIS — M1991 Primary osteoarthritis, unspecified site: Secondary | ICD-10-CM | POA: Diagnosis not present

## 2022-11-17 DIAGNOSIS — Z1231 Encounter for screening mammogram for malignant neoplasm of breast: Secondary | ICD-10-CM | POA: Diagnosis not present

## 2022-12-27 DIAGNOSIS — H26493 Other secondary cataract, bilateral: Secondary | ICD-10-CM | POA: Diagnosis not present

## 2022-12-27 DIAGNOSIS — H04123 Dry eye syndrome of bilateral lacrimal glands: Secondary | ICD-10-CM | POA: Diagnosis not present

## 2022-12-27 DIAGNOSIS — H40013 Open angle with borderline findings, low risk, bilateral: Secondary | ICD-10-CM | POA: Diagnosis not present

## 2022-12-27 DIAGNOSIS — H524 Presbyopia: Secondary | ICD-10-CM | POA: Diagnosis not present

## 2022-12-27 DIAGNOSIS — H5319 Other subjective visual disturbances: Secondary | ICD-10-CM | POA: Diagnosis not present

## 2023-01-23 DIAGNOSIS — H26492 Other secondary cataract, left eye: Secondary | ICD-10-CM | POA: Diagnosis not present

## 2023-02-22 DIAGNOSIS — M0609 Rheumatoid arthritis without rheumatoid factor, multiple sites: Secondary | ICD-10-CM | POA: Diagnosis not present

## 2023-04-25 DIAGNOSIS — Z13220 Encounter for screening for lipoid disorders: Secondary | ICD-10-CM | POA: Diagnosis not present

## 2023-04-25 DIAGNOSIS — E559 Vitamin D deficiency, unspecified: Secondary | ICD-10-CM | POA: Diagnosis not present

## 2023-04-25 DIAGNOSIS — Z1329 Encounter for screening for other suspected endocrine disorder: Secondary | ICD-10-CM | POA: Diagnosis not present

## 2023-04-25 DIAGNOSIS — D649 Anemia, unspecified: Secondary | ICD-10-CM | POA: Diagnosis not present

## 2023-04-25 DIAGNOSIS — I10 Essential (primary) hypertension: Secondary | ICD-10-CM | POA: Diagnosis not present

## 2023-05-02 DIAGNOSIS — M069 Rheumatoid arthritis, unspecified: Secondary | ICD-10-CM | POA: Diagnosis not present

## 2023-05-02 DIAGNOSIS — K2100 Gastro-esophageal reflux disease with esophagitis, without bleeding: Secondary | ICD-10-CM | POA: Diagnosis not present

## 2023-05-02 DIAGNOSIS — Z0001 Encounter for general adult medical examination with abnormal findings: Secondary | ICD-10-CM | POA: Diagnosis not present

## 2023-05-02 DIAGNOSIS — I10 Essential (primary) hypertension: Secondary | ICD-10-CM | POA: Diagnosis not present

## 2023-05-02 DIAGNOSIS — G250 Essential tremor: Secondary | ICD-10-CM | POA: Diagnosis not present

## 2023-05-21 DIAGNOSIS — H40013 Open angle with borderline findings, low risk, bilateral: Secondary | ICD-10-CM | POA: Diagnosis not present

## 2023-10-24 DIAGNOSIS — D649 Anemia, unspecified: Secondary | ICD-10-CM | POA: Diagnosis not present

## 2023-10-24 DIAGNOSIS — E559 Vitamin D deficiency, unspecified: Secondary | ICD-10-CM | POA: Diagnosis not present

## 2023-10-24 DIAGNOSIS — D559 Anemia due to enzyme disorder, unspecified: Secondary | ICD-10-CM | POA: Diagnosis not present

## 2023-10-24 DIAGNOSIS — I10 Essential (primary) hypertension: Secondary | ICD-10-CM | POA: Diagnosis not present

## 2023-10-31 DIAGNOSIS — I10 Essential (primary) hypertension: Secondary | ICD-10-CM | POA: Diagnosis not present

## 2023-10-31 DIAGNOSIS — M069 Rheumatoid arthritis, unspecified: Secondary | ICD-10-CM | POA: Diagnosis not present

## 2023-10-31 DIAGNOSIS — G250 Essential tremor: Secondary | ICD-10-CM | POA: Diagnosis not present

## 2023-10-31 DIAGNOSIS — K2100 Gastro-esophageal reflux disease with esophagitis, without bleeding: Secondary | ICD-10-CM | POA: Diagnosis not present
